# Patient Record
Sex: Male | Born: 1965 | Race: Black or African American | Hispanic: No | Marital: Single | Smoking: Never smoker
Health system: Southern US, Community
[De-identification: ages and names within clinical notes are randomized; demographics above are authoritative.]

## PROBLEM LIST (undated history)

## (undated) DIAGNOSIS — Z789 Other specified health status: Secondary | ICD-10-CM

## (undated) DIAGNOSIS — E119 Type 2 diabetes mellitus without complications: Secondary | ICD-10-CM

## (undated) DIAGNOSIS — E7800 Pure hypercholesterolemia, unspecified: Secondary | ICD-10-CM

## (undated) HISTORY — PX: ESOPHAGOGASTRODUODENOSCOPY (EGD) WITH PROPOFOL: SHX5813

---

## 2005-09-22 ENCOUNTER — Ambulatory Visit: Payer: Self-pay | Admitting: Internal Medicine

## 2009-10-22 ENCOUNTER — Ambulatory Visit: Payer: Self-pay | Admitting: Internal Medicine

## 2010-01-20 ENCOUNTER — Ambulatory Visit: Payer: Self-pay | Admitting: Gastroenterology

## 2010-06-04 IMAGING — US ABDOMEN ULTRASOUND
1 series · 17 of 25 positions shown · non-contrast
Comparison: none

REASON FOR EXAM: abd pain generalized
COMMENTS:

[Series 1: abdomen ultrasound · 17 of 74 slices shown]
[im 1/74]
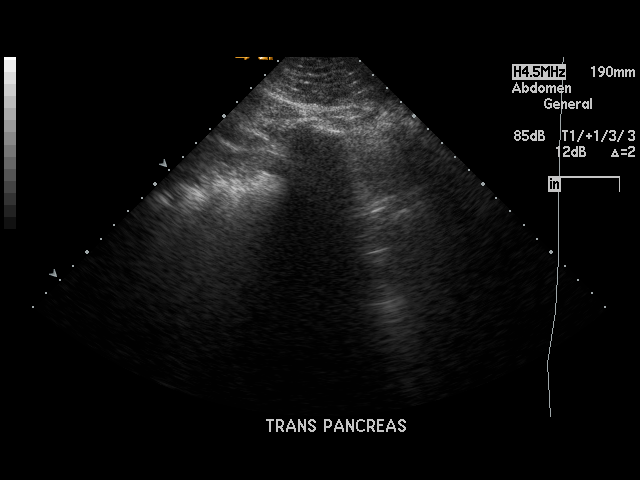
[im 7/74]
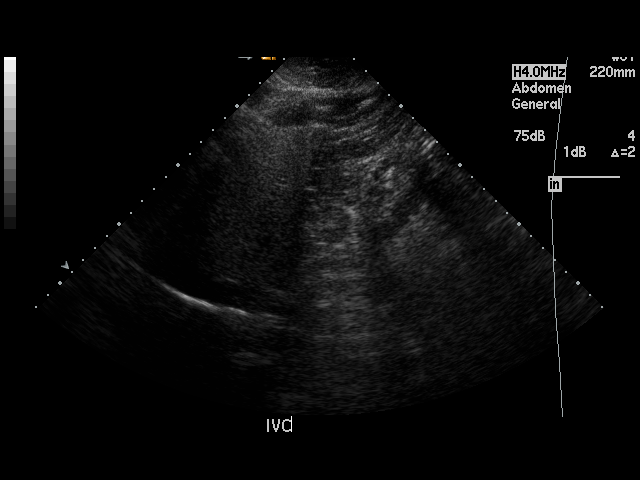
[im 10/74]
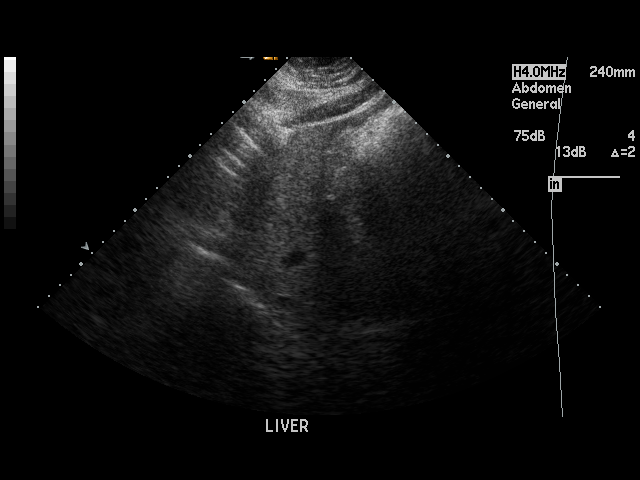
[im 16/74]
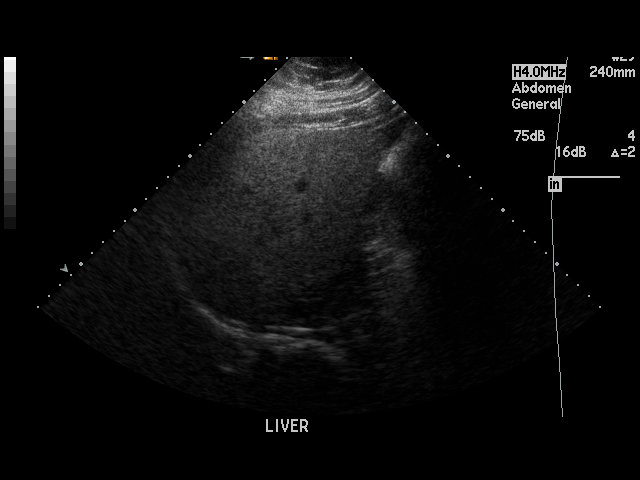
[im 19/74]
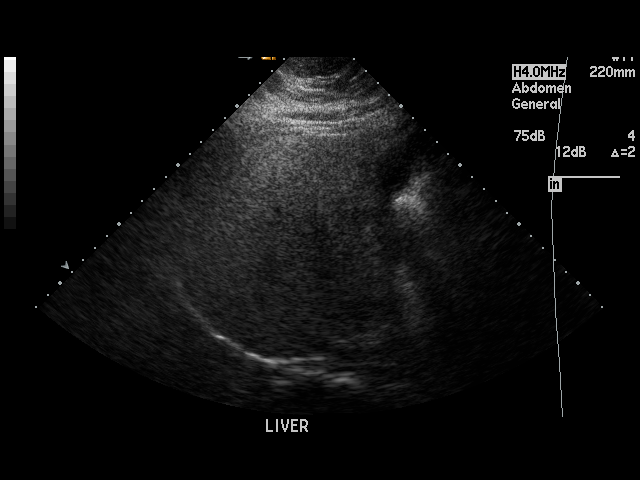
[im 25/74]
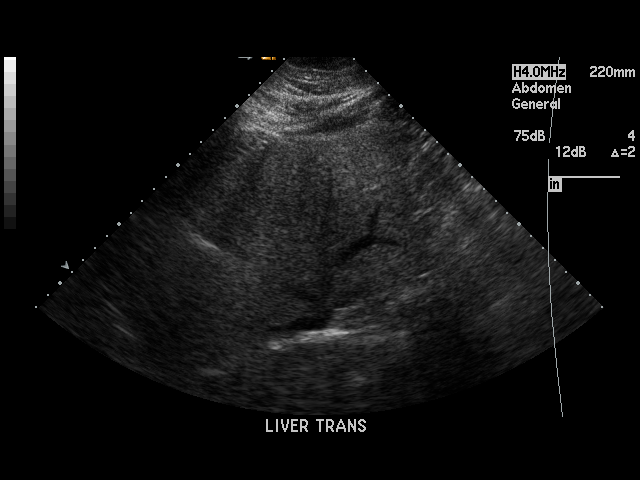
[im 28/74]
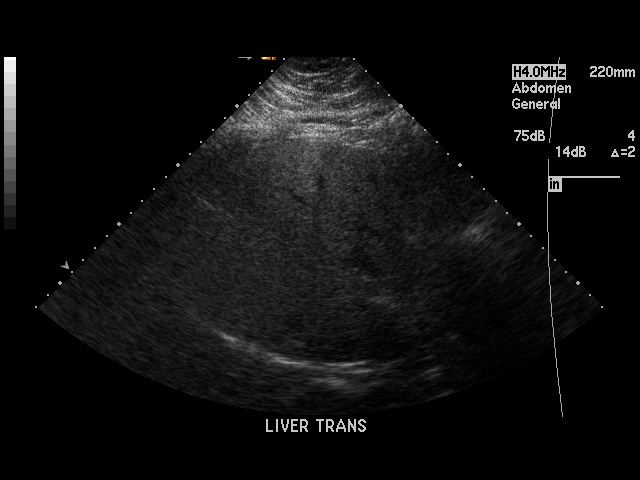
[im 34/74]
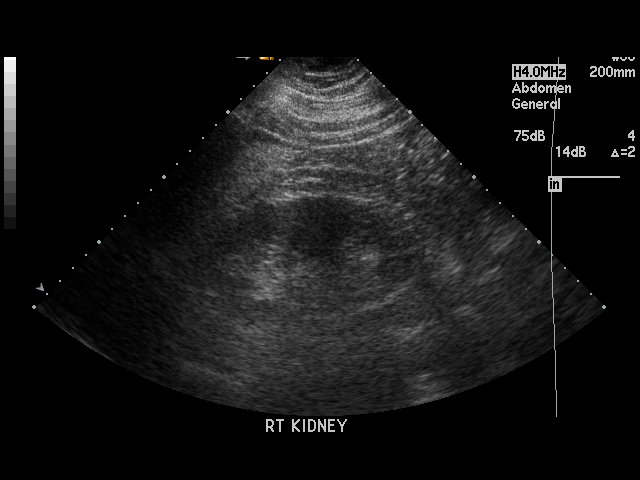
[im 37/74]
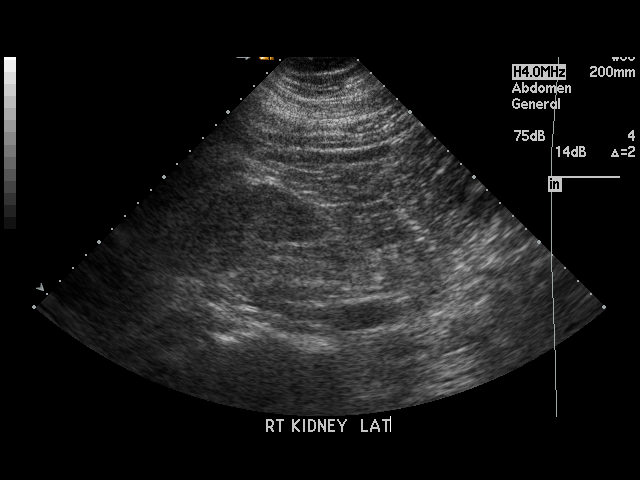
[im 40/74]
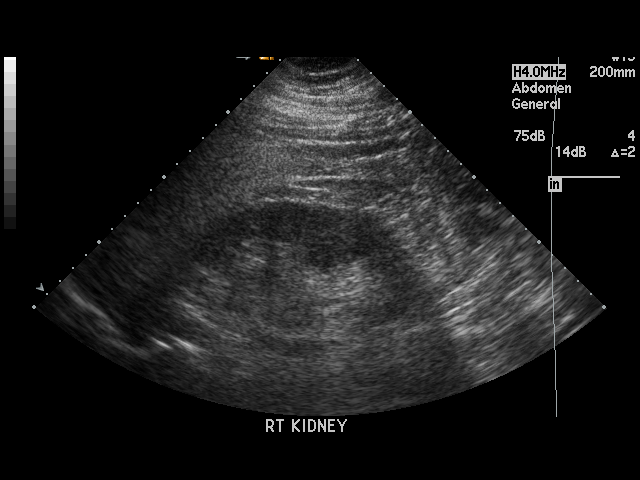
[im 46/74]
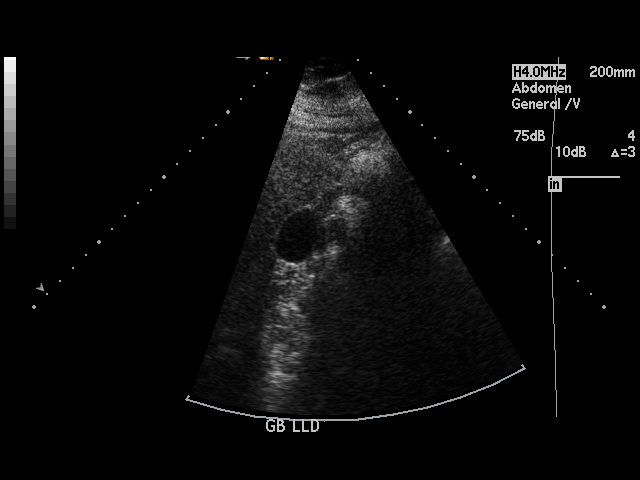
[im 49/74]
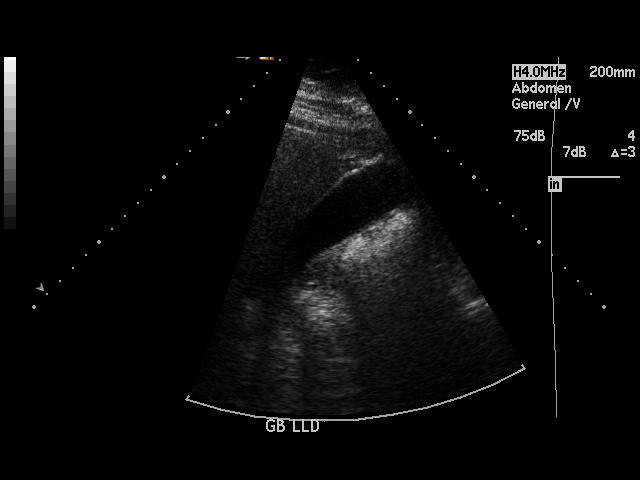
[im 55/74]
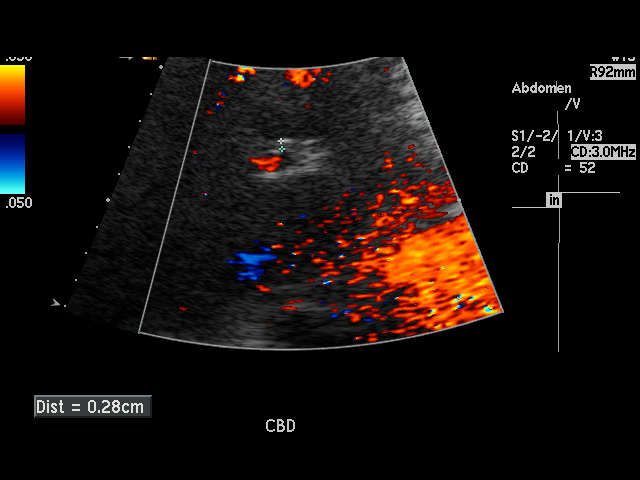
[im 58/74]
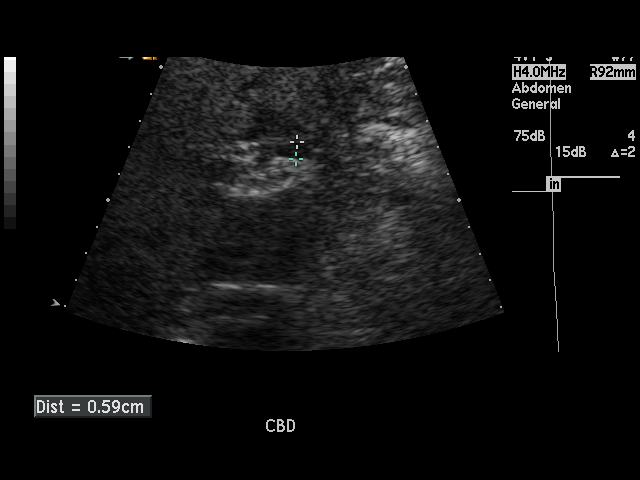
[im 64/74]
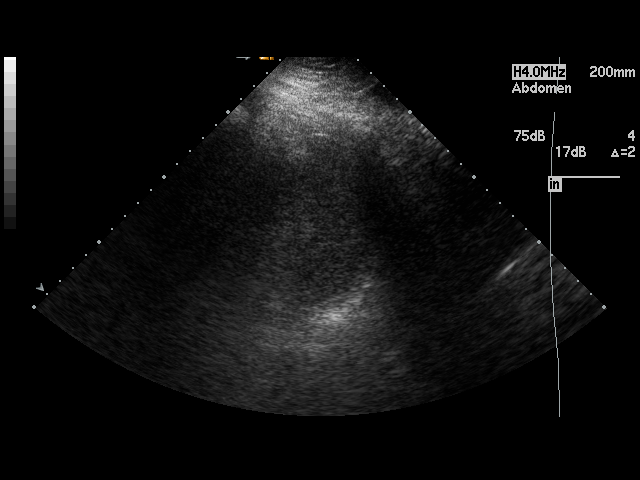
[im 67/74]
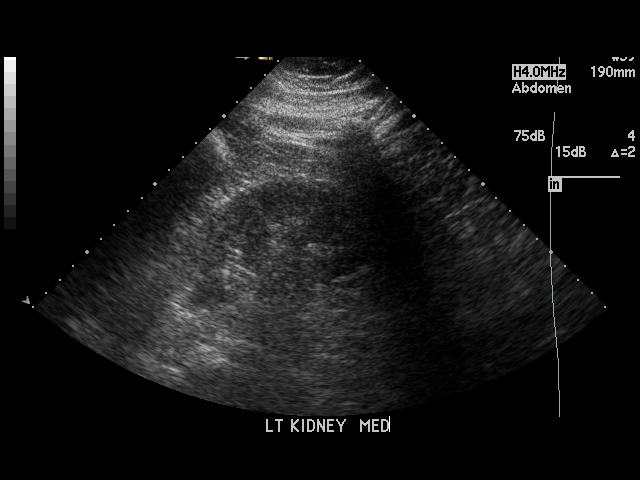
[im 74/74]
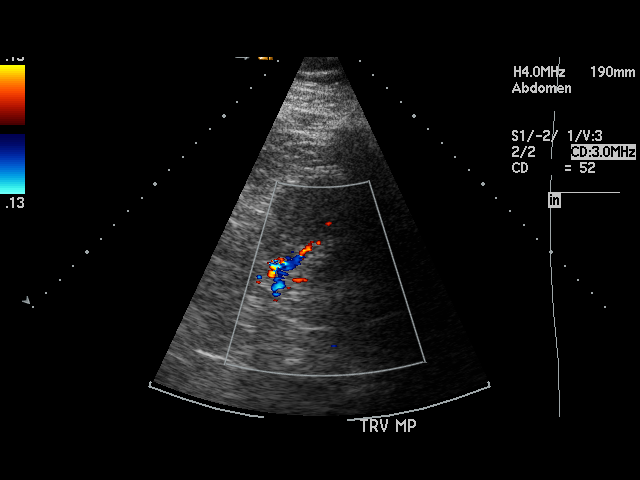

[17 of 25 positions shown; findings below may reference images not displayed]

PROCEDURE:     US  - US ABDOMEN GENERAL SURVEY  - October 22, 2009 [DATE]

RESULT:     The liver exhibits increased echotexture suggesting fatty
infiltration. Portal venous flow is normal in direction toward the liver. No
intrahepatic ductal dilation is seen. The gallbladder is adequately
distended with no evidence of stones, wall thickening, or pericholecystic
fluid. There is no sonographic Murphy's sign. The common bile duct is top
normal at 5.9 mm.

Evaluation of the pancreas and abdominal aorta was limited due to bowel gas.
No definite pancreatic lesion is seen. The abdominal aorta exhibits a
maximal measured diameter of approximately 2.6 cm in its proximal portion.
This tapers to 2.2 cm distally. The kidneys are normal in appearance.
IMPRESSION: 1. There are fatty infiltrative changes of the liver.
2. I do not see evidence of gallstones.
3. Evaluation the pancreas was limited due to bowel gas and the patient's
body habitus.
4. As best as can be determined the maximal measured diameter of the
abdominal aorta is seen proximaly and measures 2.6 cm.

## 2010-08-04 ENCOUNTER — Ambulatory Visit: Payer: Self-pay | Admitting: Gastroenterology

## 2015-08-25 ENCOUNTER — Telehealth: Payer: Self-pay | Admitting: Gastroenterology

## 2015-08-25 NOTE — Telephone Encounter (Signed)
Colonoscopy triage °

## 2017-07-11 ENCOUNTER — Encounter: Payer: Self-pay | Admitting: Emergency Medicine

## 2017-07-11 ENCOUNTER — Emergency Department: Payer: Medicare Other

## 2017-07-11 ENCOUNTER — Emergency Department
Admission: EM | Admit: 2017-07-11 | Discharge: 2017-07-11 | Disposition: A | Discharge status: Home / Self Care | Payer: Medicare Other | Attending: Emergency Medicine | Admitting: Emergency Medicine

## 2017-07-11 DIAGNOSIS — N289 Disorder of kidney and ureter, unspecified: Secondary | ICD-10-CM | POA: Insufficient documentation

## 2017-07-11 DIAGNOSIS — Z7984 Long term (current) use of oral hypoglycemic drugs: Secondary | ICD-10-CM | POA: Diagnosis not present

## 2017-07-11 DIAGNOSIS — R42 Dizziness and giddiness: Secondary | ICD-10-CM

## 2017-07-11 DIAGNOSIS — E119 Type 2 diabetes mellitus without complications: Secondary | ICD-10-CM | POA: Diagnosis not present

## 2017-07-11 DIAGNOSIS — Z79899 Other long term (current) drug therapy: Secondary | ICD-10-CM | POA: Insufficient documentation

## 2017-07-11 HISTORY — DX: Type 2 diabetes mellitus without complications: E11.9

## 2017-07-11 LAB — CBC WITH DIFFERENTIAL/PLATELET
BASOS ABS: 0 10*3/uL (ref 0–0.1)
Basophils Relative: 1 %
EOS ABS: 0.1 10*3/uL (ref 0–0.7)
EOS PCT: 2 %
HCT: 41.8 % (ref 40.0–52.0)
Hemoglobin: 13.9 g/dL (ref 13.0–18.0)
LYMPHS ABS: 1.5 10*3/uL (ref 1.0–3.6)
Lymphocytes Relative: 29 %
MCH: 27.5 pg (ref 26.0–34.0)
MCHC: 33.3 g/dL (ref 32.0–36.0)
MCV: 82.7 fL (ref 80.0–100.0)
MONO ABS: 0.6 10*3/uL (ref 0.2–1.0)
Monocytes Relative: 12 %
Neutro Abs: 2.9 10*3/uL (ref 1.4–6.5)
Neutrophils Relative %: 56 %
PLATELETS: 278 10*3/uL (ref 150–440)
RBC: 5.05 MIL/uL (ref 4.40–5.90)
RDW: 14.6 % — AB (ref 11.5–14.5)
WBC: 5.2 10*3/uL (ref 3.8–10.6)

## 2017-07-11 LAB — COMPREHENSIVE METABOLIC PANEL
ALT: 16 U/L — AB (ref 17–63)
AST: 24 U/L (ref 15–41)
Albumin: 3.8 g/dL (ref 3.5–5.0)
Alkaline Phosphatase: 73 U/L (ref 38–126)
Anion gap: 10 (ref 5–15)
BUN: 25 mg/dL — ABNORMAL HIGH (ref 6–20)
CHLORIDE: 107 mmol/L (ref 101–111)
CO2: 22 mmol/L (ref 22–32)
CREATININE: 2.52 mg/dL — AB (ref 0.61–1.24)
Calcium: 9.1 mg/dL (ref 8.9–10.3)
GFR, EST AFRICAN AMERICAN: 32 mL/min — AB (ref >60)
GFR, EST NON AFRICAN AMERICAN: 28 mL/min — AB (ref >60)
Glucose, Bld: 100 mg/dL — ABNORMAL HIGH (ref 65–99)
POTASSIUM: 4.3 mmol/L (ref 3.5–5.1)
SODIUM: 139 mmol/L (ref 135–145)
Total Bilirubin: 0.7 mg/dL (ref 0.3–1.2)
Total Protein: 7.1 g/dL (ref 6.5–8.1)

## 2017-07-11 LAB — TROPONIN I

## 2017-07-11 LAB — BASIC METABOLIC PANEL
ANION GAP: 10 (ref 5–15)
BUN: 25 mg/dL — ABNORMAL HIGH (ref 6–20)
CHLORIDE: 106 mmol/L (ref 101–111)
CO2: 23 mmol/L (ref 22–32)
Calcium: 8.8 mg/dL — ABNORMAL LOW (ref 8.9–10.3)
Creatinine, Ser: 2.38 mg/dL — ABNORMAL HIGH (ref 0.61–1.24)
GFR, EST AFRICAN AMERICAN: 34 mL/min — AB (ref >60)
GFR, EST NON AFRICAN AMERICAN: 30 mL/min — AB (ref >60)
Glucose, Bld: 133 mg/dL — ABNORMAL HIGH (ref 65–99)
POTASSIUM: 4.5 mmol/L (ref 3.5–5.1)
SODIUM: 139 mmol/L (ref 135–145)

## 2017-07-11 MED ORDER — SODIUM CHLORIDE 0.9 % IV SOLN
Freq: Once | INTRAVENOUS | Status: AC
  Administered 2017-07-11: 1000 mL via INTRAVENOUS

## 2017-07-11 NOTE — ED Provider Notes (Signed)
[facility] Emergency Department Provider Note       Time seen: ----------------------------------------- 12:26 PM on 07/11/2017 -----------------------------------------     I have reviewed the triage vital signs and the nursing notes.   HISTORY   Chief Complaint Fall     HPI Edwin [name] is a 51 y.o. male who presents to the ED for a fall that occurred due to dizziness. Patient arrives from his workplace where he reports getting dizzy and falling injuring his right knee. EMS reports low blood pressure that change from lying to sitting by about 40 points. He lives in a group home at this point denies complaints. Patient thinks he may have had a seizure because he is feeling dizzy.   Past Medical History:  Diagnosis Date  . Diabetes mellitus without complication (HCC)     There are no active problems to display for this patient.   No past surgical history on file.  Allergies Patient has no known allergies.  Social History Social History  Substance Use Topics  . Smoking status: Never Smoker  . Smokeless tobacco: Never Used  . Alcohol use No    Review of Systems Constitutional: Negative for fever. Cardiovascular: Negative for chest pain. Respiratory: Negative for shortness of breath. Gastrointestinal: Negative for abdominal pain, vomiting and diarrhea. Musculoskeletal: Negative for back pain. Skin:Positive for left knee abrasion Neurological: Negative for headaches, focal weakness or numbness. Positive for dizziness  All systems negative/normal/unremarkable except as stated in the HPI  ____________________________________________   PHYSICAL EXAM:  VITAL SIGNS: ED Triage Vitals [07/11/17 1214]  Enc Vitals Group     BP      Pulse      Resp      Temp      Temp src      SpO2      Weight (!) 302 lb 0.5 oz (137 kg)     Height 6\' 2"  (1.88 m)     Head Circumference      Peak Flow      Pain Score      Pain Loc      Pain Edu?       Excl. in GC?    Constitutional: Well appearing and in no distress. Eyes: Conjunctivae are normal. Normal extraocular movements. ENT   Head: Normocephalic and atraumatic.   Nose: No congestion/rhinnorhea.   Mouth/Throat: Mucous membranes are moist.   Neck: No stridor. Cardiovascular: Normal rate, regular rhythm. No murmurs, rubs, or gallops. Respiratory: Normal respiratory effort without tachypnea nor retractions. Breath sounds are clear and equal bilaterally. No wheezes/rales/rhonchi. Gastrointestinal: Soft and nontender. Normal bowel sounds Musculoskeletal: Nontender with normal range of motion in extremities. No lower extremity tenderness nor edema. Neurologic:  Normal speech and language. No gross focal neurologic deficits are appreciated.  Skin:  Abrasion is noted over the right patella Psychiatric: Mood and affect are normal. Speech and behavior are normal.  ____________________________________________  EKG: Interpreted by me. Sinus rhythm with rate 87 bpm, normal PR interval, normal QT. Low voltage  ____________________________________________  ED COURSE:  Pertinent labs & imaging results that were available during my care of the patient were reviewed by me and considered in my medical decision making (see chart for details). Patient presents for dizziness and a fall, we will assess with labs and imaging as indicated.   Procedures ____________________________________________   LABS (pertinent positives/negatives)  Labs Reviewed  CBC WITH DIFFERENTIAL/PLATELET - Abnormal; Notable for the following:       Result Value  RDW 14.6 (*)    All other components within normal limits  COMPREHENSIVE METABOLIC PANEL - Abnormal; Notable for the following:    Glucose, Bld 100 (*)    BUN 25 (*)    Creatinine, Ser 2.52 (*)    ALT 16 (*)    GFR calc non Af Amer 28 (*)    GFR calc Af Amer 32 (*)    All other components within normal limits  BASIC METABOLIC PANEL -  Abnormal; Notable for the following:    Glucose, Bld 133 (*)    BUN 25 (*)    Creatinine, Ser 2.38 (*)    Calcium 8.8 (*)    GFR calc non Af Amer 30 (*)    GFR calc Af Amer 34 (*)    All other components within normal limits  TROPONIN I    RADIOLOGY Images were viewed by me  IMPRESSION: Negative chest.  ____________________________________________  FINAL ASSESSMENT AND PLAN  Dizziness, Renal insufficiency  Plan: Patient's labs and imaging were dictated above. Patient had presented for dizziness which is likely multifactorial. Patient appears somewhat dehydrated and has been taking cough and cold medication for upper respiratory infection or bronchitis. His chest x-ray is negative and a low velocity stop this medication as it is probably dehydrated him and cause some dizziness. It is unclear of the renal insufficiency is new or old, he will need close outpatient follow-up but appear to be improving after fluids. I did offer admission but the family would prefer to go follow up as an outpatient.   Emily FilbertWilliams, Jonathan E, MD   Note: This note was generated in part or whole with voice recognition software. Voice recognition is usually quite accurate but there are transcription errors that can and very often do occur. I apologize for any typographical errors that were not detected and corrected.     Emily FilbertWilliams, Jonathan E, MD 07/11/17 [phone]

## 2017-07-11 NOTE — ED Notes (Signed)
Discussed discharge instructions, prescriptions, and follow-up care with patient and pt's sister. Unable to contact patient's legal guardian due to full voicemail. Sister to take patient back to established group home. No questions or concerns at this time. Pt stable at discharge.

## 2017-07-11 NOTE — ED Notes (Addendum)
Unable to leave message for patient's legal guardian/mother to return call to ED due to full voicemail.

## 2017-07-11 NOTE — ED Triage Notes (Signed)
Pt to ed via [facility] from [employer] with reports of getting dizzy and falling inuring his right knee.  Ems reports low blood pressure in supine postion at 82/54, sitting was 120/54, cbg 110, 20 gauge in RAC. 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

## 2018-02-21 IMAGING — CR DG CHEST 2V
2 series · 2 of 2 positions shown · non-contrast
Comparison: 09/22/2005

CLINICAL DATA: Cough

EXAM:
CHEST  2 VIEW

[chest lat]
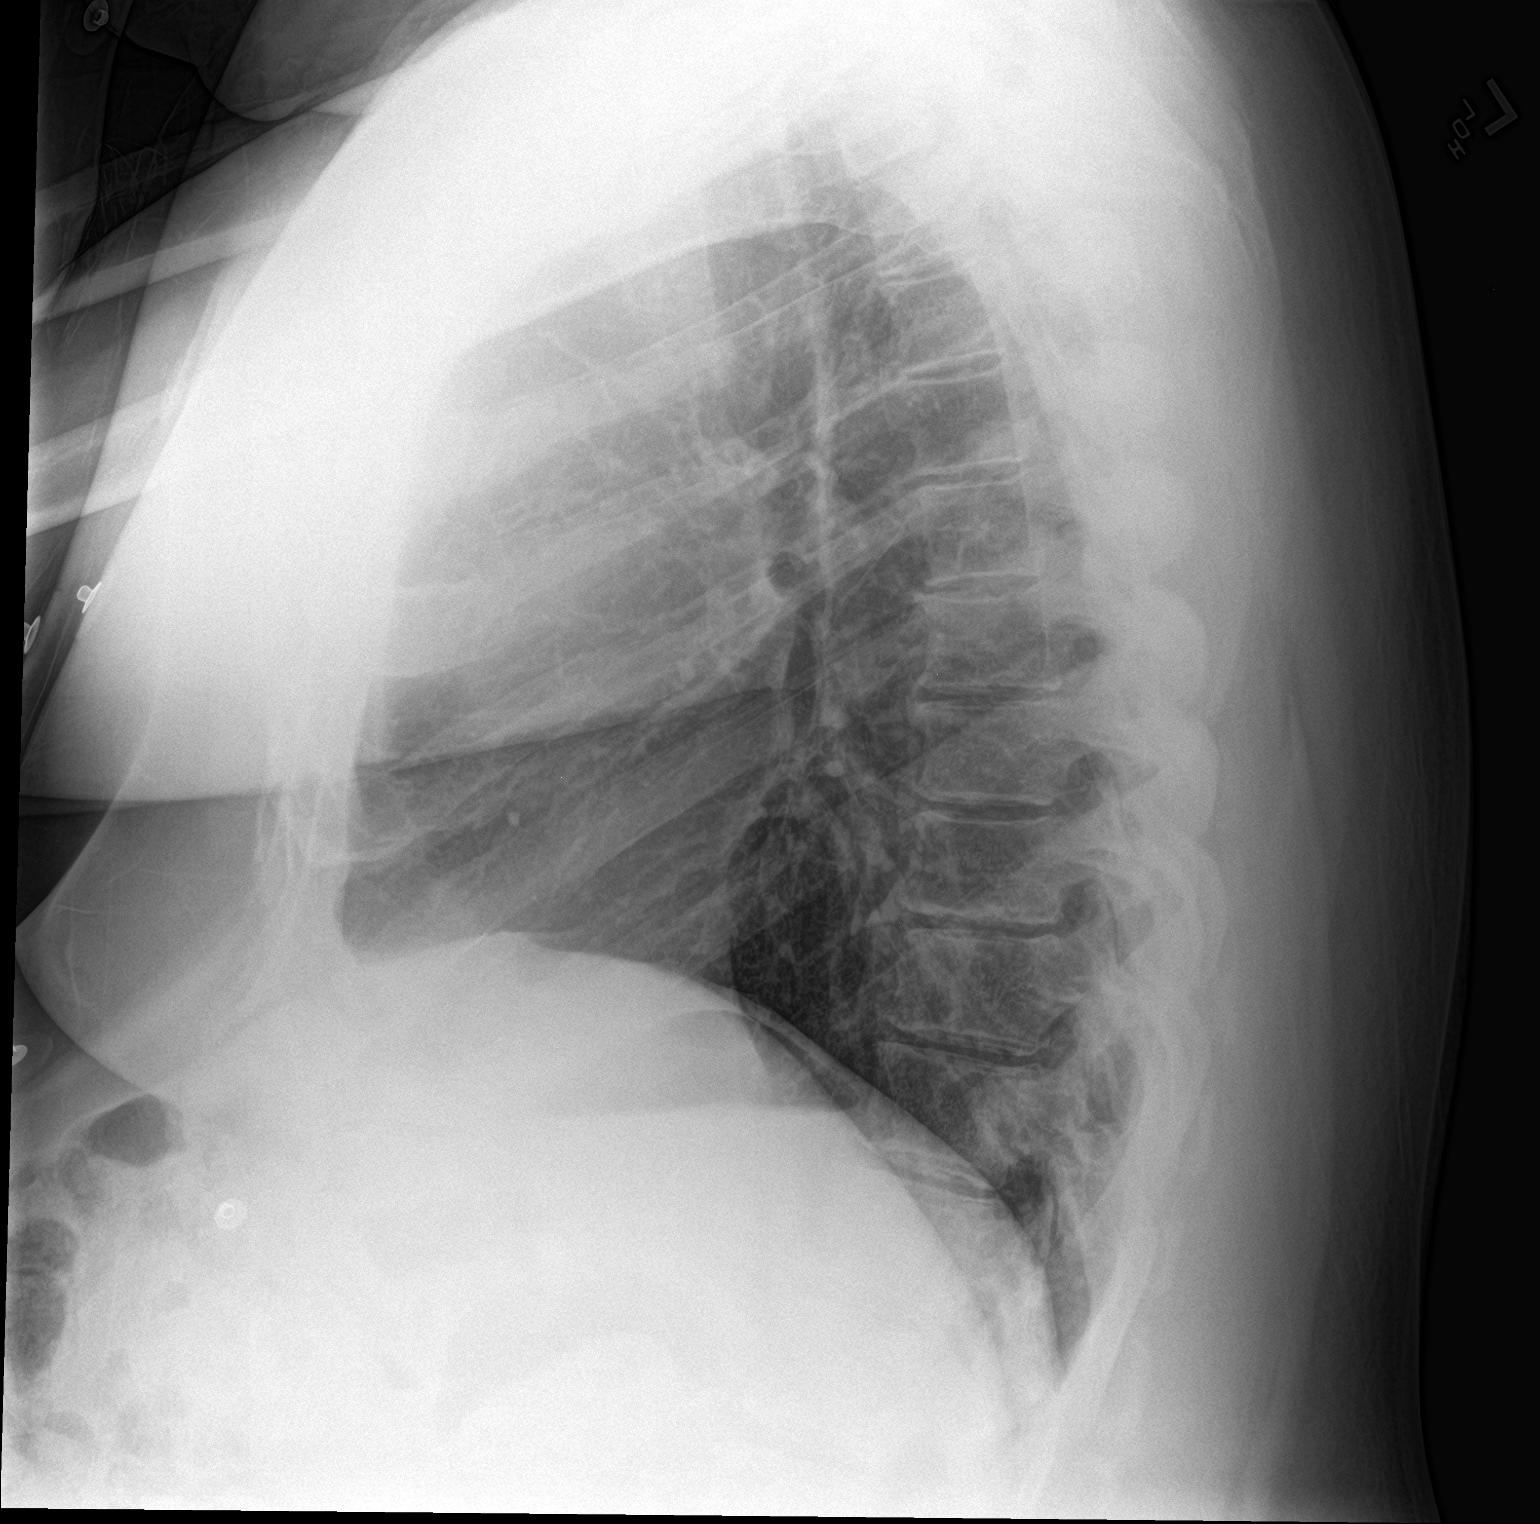

[chest ap]
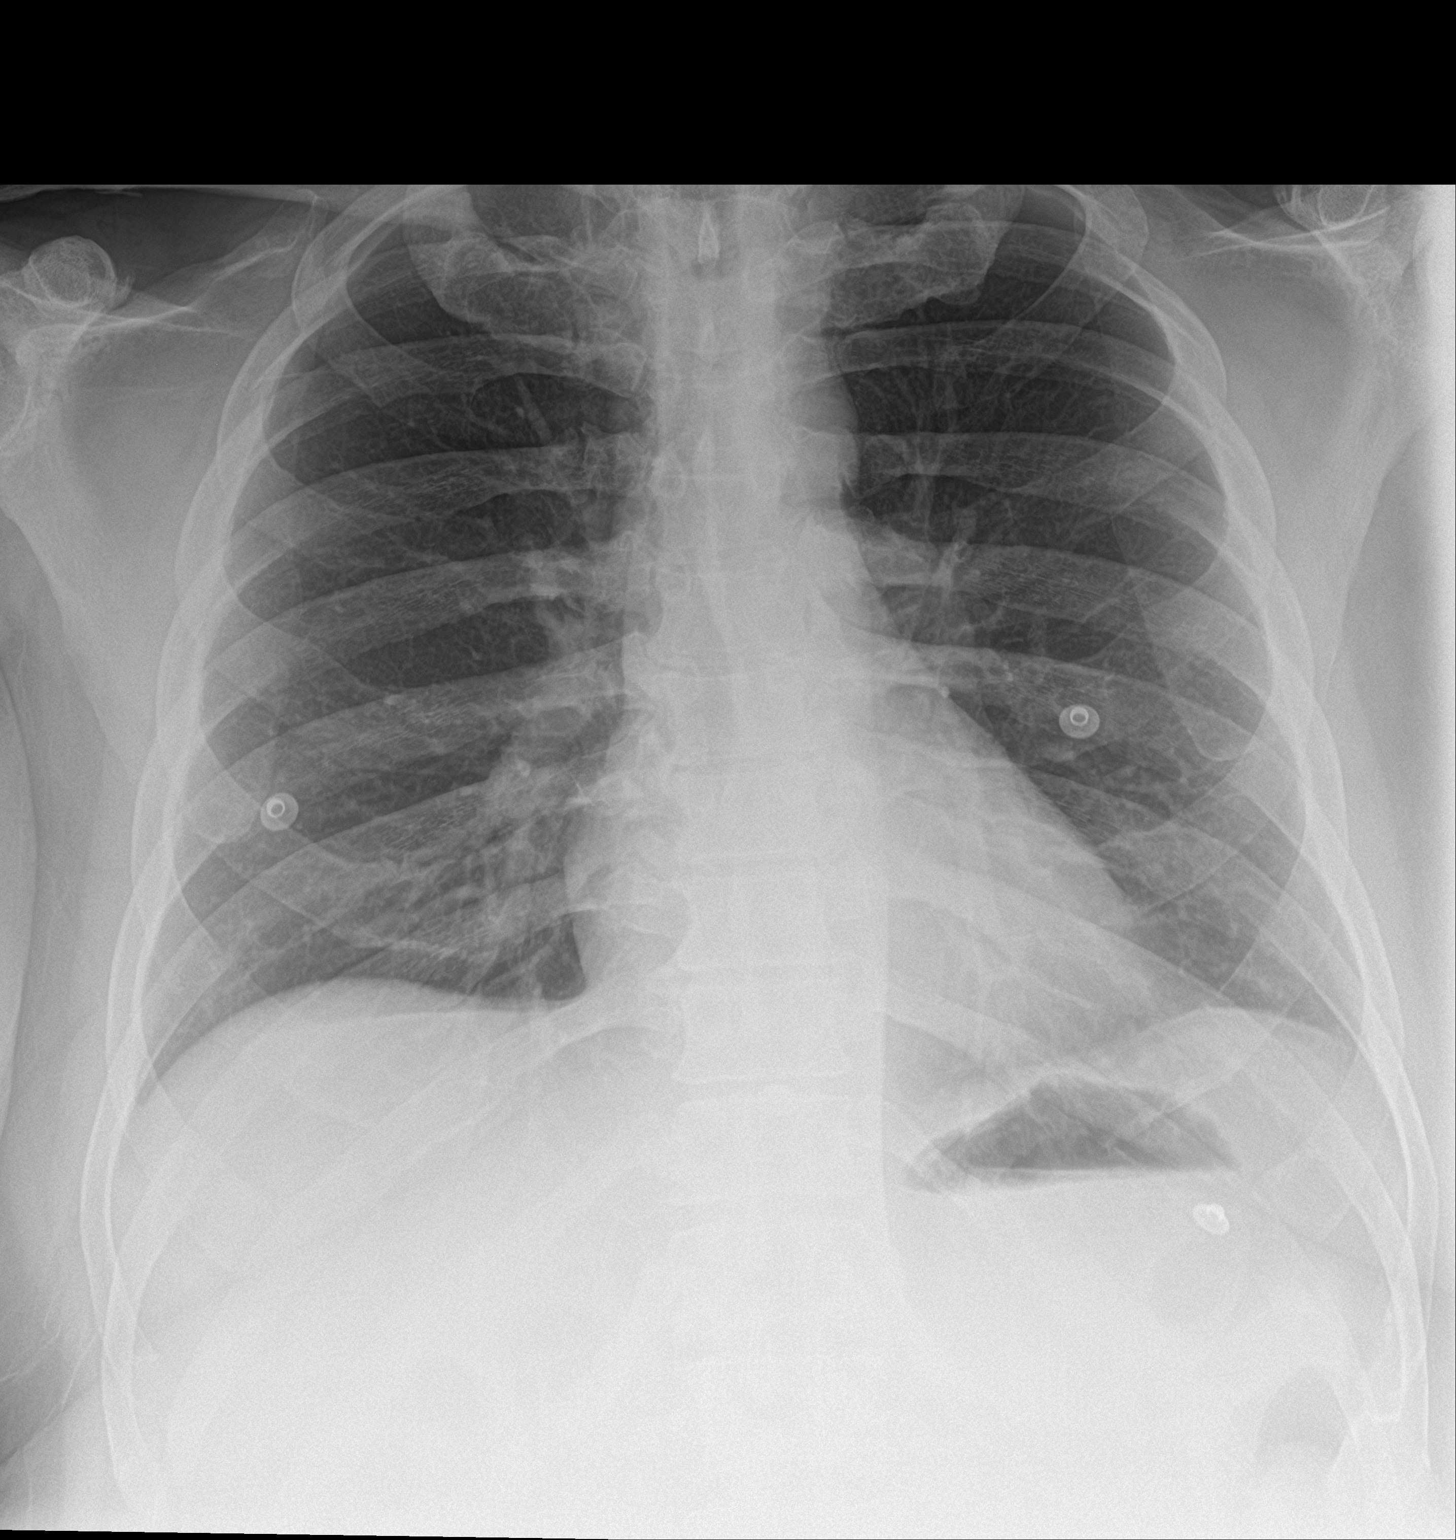

[2 of 2 positions shown; findings below may reference images not displayed]

FINDINGS: Normal heart size and mediastinal contours. No acute infiltrate or
edema. No effusion or pneumothorax. No acute osseous findings.
IMPRESSION: Negative chest.

## 2020-04-10 ENCOUNTER — Other Ambulatory Visit: Payer: Self-pay | Admitting: Internal Medicine

## 2020-05-05 ENCOUNTER — Other Ambulatory Visit: Payer: Self-pay | Admitting: Internal Medicine

## 2020-05-13 ENCOUNTER — Other Ambulatory Visit: Payer: Self-pay

## 2020-05-13 ENCOUNTER — Encounter: Payer: Self-pay | Admitting: Internal Medicine

## 2020-05-13 ENCOUNTER — Ambulatory Visit (INDEPENDENT_AMBULATORY_CARE_PROVIDER_SITE_OTHER): Payer: Medicare Other | Admitting: Internal Medicine

## 2020-05-13 VITALS — BP 124/78 | HR 101 | Wt 325.5 lb

## 2020-05-13 DIAGNOSIS — E139 Other specified diabetes mellitus without complications: Secondary | ICD-10-CM | POA: Insufficient documentation

## 2020-05-13 DIAGNOSIS — G4730 Sleep apnea, unspecified: Secondary | ICD-10-CM

## 2020-05-13 DIAGNOSIS — E669 Obesity, unspecified: Secondary | ICD-10-CM | POA: Insufficient documentation

## 2020-05-13 DIAGNOSIS — F22 Delusional disorders: Secondary | ICD-10-CM | POA: Diagnosis not present

## 2020-05-13 DIAGNOSIS — Z1329 Encounter for screening for other suspected endocrine disorder: Secondary | ICD-10-CM

## 2020-05-13 NOTE — Addendum Note (Signed)
Addended by: Melody Comas L on: 05/13/2020 09:30 AM   Modules accepted: Orders

## 2020-05-13 NOTE — Assessment & Plan Note (Signed)
Lose wt 

## 2020-05-13 NOTE — Assessment & Plan Note (Signed)
Blood sugar 88  Will check Hbaic

## 2020-05-13 NOTE — Assessment & Plan Note (Signed)
Uses cpap, advised to lose wt

## 2020-05-13 NOTE — Progress Notes (Signed)
Patient ID: Edwin [name], male   DOB: Nov 15, 1966, 54 y.o.   MRN: [id number]    Established Patient Office Visit  Subjective:  Patient ID: Edwin [name], male    DOB: 09-02-1966  Age: 54 y.o. MRN: [id number]  CC:  Chief Complaint  Patient presents with   Diabetes    patient's blood sugar was 88 this morning     HPI  [name] presents for a follow up regarding his diabetes. This morning, his blood sugar was 88. He notes that he "is feeling good." He says he hears voices and that "his mind is messing him up.". He takes his medications as prescribed. Denies heartburn, chest pain, and leg swelling. He reports that he does not walk much. He snores and uses a CPAP machine at night. He currently lives in a group home.  Past Medical History:  Diagnosis Date   Diabetes mellitus without complication (HCC)     History reviewed. No pertinent surgical history.  History reviewed. No pertinent family history.  Social History   Socioeconomic History   Marital status: Divorced    Spouse name: Not on file   Number of children: Not on file   Years of education: Not on file   Highest education level: Not on file  Occupational History   Not on file  Tobacco Use   Smoking status: Never Smoker   Smokeless tobacco: Never Used  Substance and Sexual Activity   Alcohol use: No   Drug use: No   Sexual activity: Not on file  Other Topics Concern   Not on file  Social History Narrative   Not on file   Social Determinants of Health   Financial Resource Strain:    Difficulty of Paying Living Expenses:   Food Insecurity:    Worried About Programme researcher, broadcasting/film/video in the Last Year:    Barista in the Last Year:   Transportation Needs:    Freight forwarder (Medical):    Lack of Transportation (Non-Medical):   Physical Activity:    Days of Exercise per Week:    Minutes of Exercise per Session:   Stress:    Feeling of Stress :   Social Connections:     Frequency of Communication with Friends and Family:    Frequency of Social Gatherings with Friends and Family:    Attends Religious Services:    Active Member of Clubs or Organizations:    Attends Engineer, structural:    Marital Status:   Intimate Partner Violence:    Fear of Current or Ex-Partner:    Emotionally Abused:    Physically Abused:    Sexually Abused:      Current Outpatient Medications:    Amantadine HCl 100 MG tablet, Take 100 mg by mouth 2 (two) times daily., Disp: , Rfl:    Cholecalciferol (VITAMIN D3) 1.25 MG (50000 UT) CAPS, Take 1.25 mg by mouth daily., Disp: , Rfl:    clonazePAM (KLONOPIN) 0.5 MG tablet, Take 0.5 mg by mouth at bedtime., Disp: , Rfl:    escitalopram (LEXAPRO) 10 MG tablet, Take 10 mg by mouth daily., Disp: , Rfl:    JANUVIA 100 MG tablet, TAKE 1 TABLET BY MOUTH ONCE DAILY., Disp: 30 tablet, Rfl: 0   metFORMIN (GLUCOPHAGE) 1000 MG tablet, TAKE 1 TABLET BY MOUTH TWICE DAILY, Disp: 60 tablet, Rfl: 0   omeprazole (PRILOSEC) 20 MG capsule, Take 20 mg by mouth daily., Disp: , Rfl:  risperiDONE (RISPERDAL) 3 MG tablet, Take 3 mg by mouth daily., Disp: , Rfl:    simethicone (MYLICON) 673 MG chewable tablet, Chew 125 mg by mouth 3 (three) times daily., Disp: , Rfl:    simvastatin (ZOCOR) 20 MG tablet, TAKE 1 TABLET BY MOUTH ONCE DAILY., Disp: 30 tablet, Rfl: 0   thioridazine (MELLARIL) 100 MG tablet, Take 100 mg by mouth 2 (two) times daily., Disp: , Rfl:    No Known Allergies  ROS Review of Systems  Constitutional: Negative.   HENT: Negative.   Eyes: Negative.   Respiratory: Negative.   Cardiovascular: Negative.  Negative for chest pain and leg swelling.  Gastrointestinal: Negative.   Endocrine: Negative.   Genitourinary: Negative.   Musculoskeletal: Negative.   Allergic/Immunologic: Negative.   Neurological: Negative.   Hematological: Negative.   Psychiatric/Behavioral:       Reports that he hears voices.        Objective:    Physical Exam  Constitutional: The patient is oriented to person, place, and time. Pt appears well-developed and well-nourished. Pt is obese. Head: Normocephalic and atraumatic.  Eyes: Pupils are equal, round, and reactive to light.  Neck: No JVD present. No tracheal deviation present. No thyromegaly present.  Cardiovascular: Regular rate and rhythm. No gallop. Pulmonary/Chest: Normal breath sounds. Lungs clear to auscultation. Abdominal: No abdominal tenderness. No guarding or rebound tenderness.. Musculoskeletal: Normal range of motion.  Lymphatic: No cervical adenopathy.  Neurological: No cranial nerve deficit.  Skin: Skin is warm and hydrated.  Psychiatric: The patient is experiencing delusions and possible hallucinations.  BP 124/78    Pulse (!) 101    Wt (!) 325 lb 8 oz (147.6 kg)    BMI 41.79 kg/m  Wt Readings from Last 3 Encounters:  05/13/20 (!) 325 lb 8 oz (147.6 kg)  07/11/17 (!) 302 lb 0.5 oz (137 kg)     Health Maintenance Due  Topic Date Due   HEMOGLOBIN A1C  Never done   Hepatitis C Screening  Never done   PNEUMOCOCCAL POLYSACCHARIDE VACCINE AGE 77-64 HIGH RISK  Never done   FOOT EXAM  Never done   OPHTHALMOLOGY EXAM  Never done   URINE MICROALBUMIN  Never done   COVID-19 Vaccine (1) Never done   HIV Screening  Never done   TETANUS/TDAP  Never done   COLONOSCOPY  Never done    There are no preventive care reminders to display for this patient.  No results found for: TSH Lab Results  Component Value Date   WBC 5.2 07/11/2017   HGB 13.9 07/11/2017   HCT 41.8 07/11/2017   MCV 82.7 07/11/2017   PLT 278 07/11/2017   Lab Results  Component Value Date   NA 139 07/11/2017   K 4.5 07/11/2017   CO2 23 07/11/2017   GLUCOSE 133 (H) 07/11/2017   BUN 25 (H) 07/11/2017   CREATININE 2.38 (H) 07/11/2017   BILITOT 0.7 07/11/2017   ALKPHOS 73 07/11/2017   AST 24 07/11/2017   ALT 16 (L) 07/11/2017   PROT 7.1 07/11/2017   ALBUMIN 3.8  07/11/2017   CALCIUM 8.8 (L) 07/11/2017   ANIONGAP 10 07/11/2017   No results found for: CHOL No results found for: HDL No results found for: LDLCALC No results found for: TRIG No results found for: CHOLHDL No results found for: HGBA1C    Assessment & Plan:   Problem List Items Addressed This Visit      Respiratory   Sleep apnea in adult  Uses cpap, advised to lose wt        Endocrine   Diabetes 1.5, 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  1. Diabetes 1.5, managed as type 2 (HCC) Blood sugar 88 today we will check hemoglobin AIC also check kidney function today.  2. Obesity (BMI 35.0-39.9 without comorbidity) -Patient was advised to lose weight  3. Delusion [facility]) Refer to psychiatrist for that  4. Sleep apnea in adult Patient uses CPAP machine Follow-up: Return in about 2 months (around 07/13/2020).   By signing my name below, I, Pietro Cassis, attest that this documentation has been prepared under the direction and in the presence of [name], MD. Electronically Signed: Pietro Cassis, Medical Scribe. 05/13/20. 9:12 AM.  I personally performed the services described in this documentation, which was SCRIBED in my presence. The recorded information has been reviewed and considered accurate. It has been edited as necessary during review. [name], MD

## 2020-05-14 LAB — CBC WITH DIFFERENTIAL/PLATELET
Absolute Monocytes: 538 cells/uL (ref 200–950)
Basophils Absolute: 29 cells/uL (ref 0–200)
Basophils Relative: 0.6 %
Eosinophils Absolute: 58 cells/uL (ref 15–500)
Eosinophils Relative: 1.2 %
HCT: 44.1 % (ref 38.5–50.0)
Hemoglobin: 14.3 g/dL (ref 13.2–17.1)
Lymphs Abs: 1512 cells/uL (ref 850–3900)
MCH: 27.2 pg (ref 27.0–33.0)
MCHC: 32.4 g/dL (ref 32.0–36.0)
MCV: 84 fL (ref 80.0–100.0)
MPV: 10.2 fL (ref 7.5–12.5)
Monocytes Relative: 11.2 %
Neutro Abs: 2664 cells/uL (ref 1500–7800)
Neutrophils Relative %: 55.5 %
Platelets: 189 10*3/uL (ref 140–400)
RBC: 5.25 10*6/uL (ref 4.20–5.80)
RDW: 13.1 % (ref 11.0–15.0)
Total Lymphocyte: 31.5 %
WBC: 4.8 10*3/uL (ref 3.8–10.8)

## 2020-05-14 LAB — COMPLETE METABOLIC PANEL WITH GFR
AG Ratio: 1.6 (calc) (ref 1.0–2.5)
ALT: 13 U/L (ref 9–46)
AST: 13 U/L (ref 10–35)
Albumin: 4.2 g/dL (ref 3.6–5.1)
Alkaline phosphatase (APISO): 88 U/L (ref 35–144)
BUN/Creatinine Ratio: 9 (calc) (ref 6–22)
BUN: 16 mg/dL (ref 7–25)
CO2: 22 mmol/L (ref 20–32)
Calcium: 9.5 mg/dL (ref 8.6–10.3)
Chloride: 105 mmol/L (ref 98–110)
Creat: 1.86 mg/dL — ABNORMAL HIGH (ref 0.70–1.33)
GFR, Est African American: 46 mL/min/{1.73_m2} — ABNORMAL LOW (ref >=60)
GFR, Est Non African American: 40 mL/min/{1.73_m2} — ABNORMAL LOW (ref >=60)
Globulin: 2.6 g/dL (calc) (ref 1.9–3.7)
Glucose, Bld: 75 mg/dL (ref 65–99)
Potassium: 4.4 mmol/L (ref 3.5–5.3)
Sodium: 140 mmol/L (ref 135–146)
Total Bilirubin: 0.5 mg/dL (ref 0.2–1.2)
Total Protein: 6.8 g/dL (ref 6.1–8.1)

## 2020-05-14 LAB — TSH: TSH: 1.4 mIU/L (ref 0.40–4.50)

## 2020-05-14 LAB — HEMOGLOBIN A1C
Hgb A1c MFr Bld: 5.4 % of total Hgb (ref <5.7)
Mean Plasma Glucose: 108 (calc)
eAG (mmol/L): 6 (calc)

## 2020-06-02 ENCOUNTER — Other Ambulatory Visit: Payer: Self-pay | Admitting: Internal Medicine

## 2020-06-04 ENCOUNTER — Other Ambulatory Visit: Payer: Self-pay | Admitting: Internal Medicine

## 2020-07-02 ENCOUNTER — Other Ambulatory Visit: Payer: Self-pay | Admitting: Internal Medicine

## 2020-07-17 ENCOUNTER — Ambulatory Visit: Payer: Medicare Other | Admitting: Internal Medicine

## 2020-07-24 ENCOUNTER — Ambulatory Visit: Payer: Medicare Other | Admitting: Internal Medicine

## 2020-07-29 ENCOUNTER — Other Ambulatory Visit: Payer: Self-pay | Admitting: Internal Medicine

## 2020-08-03 ENCOUNTER — Other Ambulatory Visit: Payer: Self-pay | Admitting: *Deleted

## 2020-08-03 MED ORDER — SIMVASTATIN 20 MG PO TABS: 20.0000 mg | ORAL_TABLET | Freq: Every day | ORAL | 6 refills | Status: DC

## 2020-08-03 MED ORDER — OMEPRAZOLE 20 MG PO CPDR: 20.0000 mg | DELAYED_RELEASE_CAPSULE | Freq: Every day | ORAL | 6 refills | Status: DC

## 2020-08-03 MED ORDER — SITAGLIPTIN PHOSPHATE 100 MG PO TABS: 100.0000 mg | ORAL_TABLET | Freq: Every day | ORAL | 6 refills | Status: DC

## 2020-08-21 ENCOUNTER — Encounter: Payer: Self-pay | Admitting: Internal Medicine

## 2020-08-21 ENCOUNTER — Other Ambulatory Visit: Payer: Self-pay

## 2020-08-21 ENCOUNTER — Ambulatory Visit (INDEPENDENT_AMBULATORY_CARE_PROVIDER_SITE_OTHER): Payer: Medicare Other | Admitting: Internal Medicine

## 2020-08-21 VITALS — BP 151/95 | HR 101 | Ht 74.0 in | Wt 315.5 lb

## 2020-08-21 DIAGNOSIS — G4730 Sleep apnea, unspecified: Secondary | ICD-10-CM

## 2020-08-21 DIAGNOSIS — E669 Obesity, unspecified: Secondary | ICD-10-CM | POA: Diagnosis not present

## 2020-08-21 DIAGNOSIS — E782 Mixed hyperlipidemia: Secondary | ICD-10-CM | POA: Diagnosis not present

## 2020-08-21 DIAGNOSIS — E139 Other specified diabetes mellitus without complications: Secondary | ICD-10-CM | POA: Diagnosis not present

## 2020-08-21 LAB — GLUCOSE, POCT (MANUAL RESULT ENTRY): POC Glucose: 73 mg/dl (ref 70–99)

## 2020-08-21 NOTE — Assessment & Plan Note (Signed)
Pt uses  Sleep machine daily

## 2020-08-21 NOTE — Assessment & Plan Note (Signed)
-   The patient's blood sugar is under control on metformin. - The patient will continue the current treatment regimen.  - I encouraged the patient to regularly check blood sugar.  - I encouraged the patient to monitor diet. I encouraged the patient to eat low-carb and low-sugar to help prevent blood sugar spikes.  - I encouraged the patient to continue following their prescribed treatment plan for diabetes - I informed the patient to get help if blood sugar drops below 54mg/dL, or if suddenly have trouble thinking clearly or breathing.     

## 2020-08-21 NOTE — Assessment & Plan Note (Signed)
-   I encouraged the patient to lose weight.  - I educated them on making healthy dietary choices including eating more fruits and vegetables and less fried foods. - I encouraged the patient to exercise more, and educated on the benefits of exercise including weight loss, diabetes prevention, and hypertension prevention.   

## 2020-08-21 NOTE — Progress Notes (Signed)
Established Patient Office Visit  Subjective:  Patient ID: Edwin [name], male    DOB: [birth year]  Age: 54 y.o. MRN: [id number]  CC:  Chief Complaint  Patient presents with  . Diabetes    2 month follow up    Diabetes He has type 2 diabetes mellitus. His disease course has been fluctuating. Hypoglycemia symptoms include confusion. Pertinent negatives for hypoglycemia include no headaches or nervousness/anxiousness. Associated symptoms include fatigue and weight loss. Pertinent negatives for diabetes include no blurred vision and no chest pain. Pertinent negatives for diabetic complications include no autonomic neuropathy. Risk factors for coronary artery disease include dyslipidemia, sedentary lifestyle and obesity. He never participates in exercise.    Edwin [name] presents for  Tiredness   sleeiness  Past Medical History:  Diagnosis Date  . Diabetes mellitus without complication (HCC)     History reviewed. No pertinent surgical history.  History reviewed. No pertinent family history.  Social History   Socioeconomic History  . Marital status: Divorced    Spouse name: Not on file  . Number of children: Not on file  . Years of education: Not on file  . Highest education level: Not on file  Occupational History  . Not on file  Tobacco Use  . Smoking status: Never Smoker  . Smokeless tobacco: Never Used  Substance and Sexual Activity  . Alcohol use: No  . Drug use: No  . Sexual activity: Not on file  Other Topics Concern  . Not on file  Social History Narrative  . Not on file   Social Determinants of Health   Financial Resource Strain:   . Difficulty of Paying Living Expenses: Not on file  Food Insecurity:   . Worried About Programme researcher, broadcasting/film/video in the Last Year: Not on file  . Ran Out of Food in the Last Year: Not on file  Transportation Needs:   . Lack of Transportation (Medical): Not on file  . Lack of Transportation (Non-Medical): Not on file  Physical  Activity:   . Days of Exercise per Week: Not on file  . Minutes of Exercise per Session: Not on file  Stress:   . Feeling of Stress : Not on file  Social Connections:   . Frequency of Communication with Friends and Family: Not on file  . Frequency of Social Gatherings with Friends and Family: Not on file  . Attends Religious Services: Not on file  . Active Member of Clubs or Organizations: Not on file  . Attends Banker Meetings: Not on file  . Marital Status: Not on file  Intimate Partner Violence:   . Fear of Current or Ex-Partner: Not on file  . Emotionally Abused: Not on file  . Physically Abused: Not on file  . Sexually Abused: Not on file     Current Outpatient Medications:  .  Amantadine HCl 100 MG tablet, Take 100 mg by mouth 2 (two) times daily., Disp: , Rfl:  .  Cholecalciferol (VITAMIN D3) 1.25 MG (50000 UT) CAPS, Take 1.25 mg by mouth daily., Disp: , Rfl:  .  clonazePAM (KLONOPIN) 0.5 MG tablet, Take 0.5 mg by mouth at bedtime., Disp: , Rfl:  .  escitalopram (LEXAPRO) 10 MG tablet, Take 10 mg by mouth daily., Disp: , Rfl:  .  metFORMIN (GLUCOPHAGE) 1000 MG tablet, TAKE 1 TABLET BY MOUTH TWICE DAILY, Disp: 60 tablet, Rfl: 0 .  omeprazole (PRILOSEC) 20 MG capsule, Take 1 capsule (20 mg total) by mouth  daily., Disp: 30 capsule, Rfl: 6 .  risperiDONE (RISPERDAL) 3 MG tablet, Take 3 mg by mouth daily., Disp: , Rfl:  .  simethicone (MYLICON) 125 MG chewable tablet, Chew 125 mg by mouth 3 (three) times daily., Disp: , Rfl:  .  simvastatin (ZOCOR) 20 MG tablet, Take 1 tablet (20 mg total) by mouth daily., Disp: 30 tablet, Rfl: 6 .  sitaGLIPtin (JANUVIA) 100 MG tablet, Take 1 tablet (100 mg total) by mouth daily., Disp: 30 tablet, Rfl: 6 .  thioridazine (MELLARIL) 100 MG tablet, Take 100 mg by mouth 2 (two) times daily., Disp: , Rfl:    No Known Allergies  ROS Review of Systems  Constitutional: Positive for fatigue and weight loss.  Eyes: Negative for blurred  vision.  Cardiovascular: Negative for chest pain.  Neurological: Negative for headaches.  Psychiatric/Behavioral: Positive for confusion. The patient is not nervous/anxious.       Objective:    Physical Exam Vitals reviewed.  Constitutional:      Appearance: Normal appearance.  HENT:     Mouth/Throat:     Mouth: Mucous membranes are moist.  Eyes:     Pupils: Pupils are equal, round, and reactive to light.  Neck:     Vascular: No carotid bruit.  Cardiovascular:     Rate and Rhythm: Normal rate and regular rhythm.     Pulses: Normal pulses.     Heart sounds: Normal heart sounds.  Pulmonary:     Effort: Pulmonary effort is normal.     Breath sounds: Normal breath sounds.  Abdominal:     General: Bowel sounds are normal.     Palpations: Abdomen is soft. There is no hepatomegaly, splenomegaly or mass.     Tenderness: There is no abdominal tenderness.     Hernia: No hernia is present.  Musculoskeletal:     Cervical back: Neck supple.     Right lower leg: No edema.     Left lower leg: No edema.  Skin:    Findings: No rash.  Neurological:     Mental Status: He is alert and oriented to person, place, and time.     Motor: No weakness.  Psychiatric:        Mood and Affect: Mood normal.        Behavior: Behavior normal.     BP (!) 151/95   Pulse (!) 101   Ht 6\' 2"  (1.88 m)   Wt (!) 315 lb 8 oz (143.1 kg)   BMI 40.51 kg/m  Wt Readings from Last 3 Encounters:  08/21/20 (!) 315 lb 8 oz (143.1 kg)  05/13/20 (!) 325 lb 8 oz (147.6 kg)  07/11/17 (!) 302 lb 0.5 oz (137 kg)     Health Maintenance Due  Topic Date Due  . Hepatitis C Screening  Never done  . PNEUMOCOCCAL POLYSACCHARIDE VACCINE AGE 76-64 HIGH RISK  Never done  . FOOT EXAM  Never done  . OPHTHALMOLOGY EXAM  Never done  . URINE MICROALBUMIN  Never done  . COVID-19 Vaccine (1) Never done  . HIV Screening  Never done  . TETANUS/TDAP  Never done  . COLONOSCOPY  Never done  . INFLUENZA VACCINE  Never done     There are no preventive care reminders to display for this patient.  Lab Results  Component Value Date   TSH 1.40 05/13/2020   Lab Results  Component Value Date   WBC 4.8 05/13/2020   HGB 14.3 05/13/2020   HCT 44.1 05/13/2020  MCV 84.0 05/13/2020   PLT 189 05/13/2020   Lab Results  Component Value Date   NA 140 05/13/2020   K 4.4 05/13/2020   CO2 22 05/13/2020   GLUCOSE 75 05/13/2020   BUN 16 05/13/2020   CREATININE 1.86 (H) 05/13/2020   BILITOT 0.5 05/13/2020   ALKPHOS 73 07/11/2017   AST 13 05/13/2020   ALT 13 05/13/2020   PROT 6.8 05/13/2020   ALBUMIN 3.8 07/11/2017   CALCIUM 9.5 05/13/2020   ANIONGAP 10 07/11/2017   No results found for: CHOL No results found for: HDL No results found for: LDLCALC No results found for: TRIG No results found for: CHOLHDL Lab Results  Component Value Date   HGBA1C 5.4 05/13/2020      Assessment & Plan:   Problem List Items Addressed This Visit      Respiratory   Sleep apnea in adult    Pt uses  Sleep machine daily        Endocrine   Diabetes 1.5, 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 - The patient will continue the current treatment regimen.  - I encouraged the patient to regularly check blood sugar.  - I encouraged the patient to monitor diet. I encouraged the patient to eat low-carb and low-sugar to help prevent blood sugar spikes.  - I encouraged the patient to continue following their prescribed treatment plan for diabetes - I informed the patient to get help if blood sugar drops below 54mg /dL, or if suddenly have trouble thinking clearly or breathing.          Relevant Orders   POCT glucose (manual entry) (Completed)     Other   Obesity (BMI 35.0-39.9 without comorbidity)    - I encouraged the patient to lose weight.  - I educated them on making healthy dietary choices including eating more fruits and vegetables and less fried foods. - I  encouraged the patient to exercise more, and educated on the benefits of exercise including weight loss, diabetes prevention, and hypertension prevention.        Moderate mixed hyperlipidemia not requiring statin therapy    - The patient's hyperlipidemia is stable on statin. - The patient will continue the current treatment regimen.  - I encouraged the patient to eat more vegetables and whole wheat, and to avoid fatty foods like whole milk, hard cheese, egg yolks, margarine, baked sweets, and fried foods.  - I encouraged the patient to live an active lifestyle and complete activities for 40 minutes at least three times per week.  - I instructed the patient to go to the ER if they begin having chest pain.          No orders of the defined types were placed in this encounter.   Follow-up: No follow-ups on file.    11-04-1990, MD

## 2020-08-21 NOTE — Assessment & Plan Note (Signed)

## 2020-11-17 ENCOUNTER — Other Ambulatory Visit: Payer: Self-pay | Admitting: Internal Medicine

## 2020-11-20 ENCOUNTER — Other Ambulatory Visit: Payer: Self-pay

## 2020-11-20 ENCOUNTER — Ambulatory Visit (INDEPENDENT_AMBULATORY_CARE_PROVIDER_SITE_OTHER): Payer: Medicare Other | Admitting: Family Medicine

## 2020-11-20 ENCOUNTER — Encounter: Payer: Self-pay | Admitting: Family Medicine

## 2020-11-20 VITALS — BP 98/66 | HR 98 | Temp 97.0°F | Resp 18 | Ht 74.0 in | Wt 304.0 lb

## 2020-11-20 DIAGNOSIS — E139 Other specified diabetes mellitus without complications: Secondary | ICD-10-CM | POA: Diagnosis not present

## 2020-11-20 DIAGNOSIS — N1832 Chronic kidney disease, stage 3b: Secondary | ICD-10-CM | POA: Diagnosis not present

## 2020-11-20 DIAGNOSIS — Z1211 Encounter for screening for malignant neoplasm of colon: Secondary | ICD-10-CM | POA: Insufficient documentation

## 2020-11-20 DIAGNOSIS — E669 Obesity, unspecified: Secondary | ICD-10-CM

## 2020-11-20 DIAGNOSIS — Z0000 Encounter for general adult medical examination without abnormal findings: Secondary | ICD-10-CM | POA: Diagnosis not present

## 2020-11-20 DIAGNOSIS — Z23 Encounter for immunization: Secondary | ICD-10-CM | POA: Diagnosis not present

## 2020-11-20 DIAGNOSIS — G4730 Sleep apnea, unspecified: Secondary | ICD-10-CM | POA: Diagnosis not present

## 2020-11-20 NOTE — Assessment & Plan Note (Signed)
Diabetes mellitus Type II, under good control..  Taking all meds, will draw labs today.

## 2020-11-20 NOTE — Assessment & Plan Note (Signed)
Uses CPAP every night, sleeps well with the mask.

## 2020-11-20 NOTE — Progress Notes (Signed)
Established Patient Office Visit  SUBJECTIVE:  Subjective  Patient ID: Edwin [name], male    DOB: 23-Oct-1966  Age: 54 y.o. MRN: [id number]  CC:  Chief Complaint  Patient presents with  . Annual Exam  . Diabetes    Edwin [name] is a 54 y.o. male presenting today for     Past Medical History:  Diagnosis Date  . Diabetes mellitus without complication (HCC)     History reviewed. No pertinent surgical history.  History reviewed. No pertinent family history.  Social History   Socioeconomic History  . Marital status: Divorced    Spouse name: Not on file  . Number of children: Not on file  . Years of education: Not on file  . Highest education level: Not on file  Occupational History  . Not on file  Tobacco Use  . Smoking status: Never Smoker  . Smokeless tobacco: Never Used  Substance and Sexual Activity  . Alcohol use: No  . Drug use: No  . Sexual activity: Not on file  Other Topics Concern  . Not on file  Social History Narrative  . Not on file   Social Determinants of Health   Financial Resource Strain: Not on file  Food Insecurity: Not on file  Transportation Needs: Not on file  Physical Activity: Not on file  Stress: Not on file  Social Connections: Not on file  Intimate Partner Violence: Not on file     Current Outpatient Medications:  .  Amantadine HCl 100 MG tablet, Take 100 mg by mouth 2 (two) times daily., Disp: , Rfl:  .  Cholecalciferol (VITAMIN D3) 1.25 MG (50000 UT) CAPS, Take 1.25 mg by mouth daily., Disp: , Rfl:  .  clonazePAM (KLONOPIN) 0.5 MG tablet, Take 0.5 mg by mouth at bedtime., Disp: , Rfl:  .  escitalopram (LEXAPRO) 10 MG tablet, Take 10 mg by mouth daily., Disp: , Rfl:  .  metFORMIN (GLUCOPHAGE) 1000 MG tablet, TAKE 1 TABLET BY MOUTH TWICE DAILY, Disp: 60 tablet, Rfl: 0 .  omeprazole (PRILOSEC) 20 MG capsule, Take 1 capsule (20 mg total) by mouth daily., Disp: 30 capsule, Rfl: 6 .  risperiDONE (RISPERDAL) 3 MG tablet,  Take 3 mg by mouth daily., Disp: , Rfl:  .  simethicone (MYLICON) 125 MG chewable tablet, Chew 125 mg by mouth 3 (three) times daily., Disp: , Rfl:  .  simvastatin (ZOCOR) 20 MG tablet, Take 1 tablet (20 mg total) by mouth daily., Disp: 30 tablet, Rfl: 6 .  sitaGLIPtin (JANUVIA) 100 MG tablet, Take 1 tablet (100 mg total) by mouth daily., Disp: 30 tablet, Rfl: 6 .  thioridazine (MELLARIL) 100 MG tablet, Take 100 mg by mouth 2 (two) times daily., Disp: , Rfl:    No Known Allergies  ROS Review of Systems  Constitutional: Negative.   HENT: Negative.   Respiratory: Negative.   Cardiovascular: Negative.   Musculoskeletal: Negative.   Skin: Negative.   Neurological: Negative.   Psychiatric/Behavioral: Negative.      OBJECTIVE:    Physical Exam Vitals and nursing note reviewed.  Constitutional:      Appearance: Normal appearance. He is obese.  HENT:     Head: Normocephalic.     Right Ear: Tympanic membrane normal.     Left Ear: Tympanic membrane normal.  Eyes:     Pupils: Pupils are equal, round, and reactive to light.  Cardiovascular:     Rate and Rhythm: Normal rate and regular rhythm.  Pulses: Normal pulses.  Musculoskeletal:        General: Normal range of motion.     Cervical back: Normal range of motion.  Neurological:     Mental Status: He is oriented to person, place, and time.  Psychiatric:        Mood and Affect: Mood normal.     BP 98/66   Pulse 98   Temp (!) 97 F (36.1 C)   Resp 18   Ht 6\' 2"  (1.88 m)   Wt (!) 304 lb (137.9 kg)   BMI 39.03 kg/m  Wt Readings from Last 3 Encounters:  11/20/20 (!) 304 lb (137.9 kg)  08/21/20 (!) 315 lb 8 oz (143.1 kg)  05/13/20 (!) 325 lb 8 oz (147.6 kg)    Health Maintenance Due  Topic Date Due  . Hepatitis C Screening  Never done  . PNEUMOCOCCAL POLYSACCHARIDE VACCINE AGE 35-64 HIGH RISK  Never done  . FOOT EXAM  Never done  . OPHTHALMOLOGY EXAM  Never done  . URINE MICROALBUMIN  Never done  . COVID-19 Vaccine  (1) Never done  . HIV Screening  Never done  . TETANUS/TDAP  Never done  . COLONOSCOPY  Never done  . INFLUENZA VACCINE  Never done  . HEMOGLOBIN A1C  11/12/2020    There are no preventive care reminders to display for this patient.  CBC Latest Ref Rng & Units 05/13/2020 07/11/2017  WBC 3.8 - 10.8 Thousand/uL 4.8 5.2  Hemoglobin 13.2 - 17.1 g/dL 09/10/2017 64.6  Hematocrit 80.3 - 50.0 % 44.1 41.8  Platelets 140 - 400 Thousand/uL 189 278   CMP Latest Ref Rng & Units 05/13/2020 07/11/2017 07/11/2017  Glucose 65 - 99 mg/dL 75 09/10/2017) 248(G)  BUN 7 - 25 mg/dL 16 500(B) 70(W)  Creatinine 0.70 - 1.33 mg/dL 88(Q) 9.16(X) 4.50(T)  Sodium 135 - 146 mmol/L 140 139 139  Potassium 3.5 - 5.3 mmol/L 4.4 4.5 4.3  Chloride 98 - 110 mmol/L 105 106 107  CO2 20 - 32 mmol/L 22 23 22   Calcium 8.6 - 10.3 mg/dL 9.5 8.88(K) 9.1  Total Protein 6.1 - 8.1 g/dL 6.8 - 7.1  Total Bilirubin 0.2 - 1.2 mg/dL 0.5 - 0.7  Alkaline Phos 38 - 126 U/L - - 73  AST 10 - 35 U/L 13 - 24  ALT 9 - 46 U/L 13 - 16(L)    Lab Results  Component Value Date   TSH 1.40 05/13/2020   Lab Results  Component Value Date   ALBUMIN 3.8 07/11/2017   ANIONGAP 10 07/11/2017   No results found for: CHOL, HDL, LDLCALC, CHOLHDL No results found for: TRIG Lab Results  Component Value Date   HGBA1C 5.4 05/13/2020      ASSESSMENT & PLAN:   Problem List Items Addressed This Visit      Respiratory   Sleep apnea in adult     Other   Obesity (BMI 35.0-39.9 without comorbidity)   Moderate mixed hyperlipidemia not requiring statin therapy    Other Visit Diagnoses    Stage 3b chronic kidney disease (HCC)    -  Primary      No orders of the defined types were placed in this encounter.     Follow-up: No follow-ups on file.    07/13/2020, FNP [facility] [address], [place], [address] [place]

## 2020-11-20 NOTE — Assessment & Plan Note (Addendum)
Followed by [facility]. Abstains from NSAIDS, has apt scheduled with nephrology.

## 2020-11-20 NOTE — Assessment & Plan Note (Signed)
Not at goal, no current calorie restriction, many barriers described by care home employee due to MR and caloric intake.  Plan- High nutrition low carb foods discussed. Food restriction during non meal times.

## 2020-11-21 LAB — COMPLETE METABOLIC PANEL WITH GFR
AG Ratio: 1.5 (calc) (ref 1.0–2.5)
ALT: 10 U/L (ref 9–46)
AST: 11 U/L (ref 10–35)
Albumin: 3.8 g/dL (ref 3.6–5.1)
Alkaline phosphatase (APISO): 76 U/L (ref 35–144)
BUN/Creatinine Ratio: 9 (calc) (ref 6–22)
BUN: 15 mg/dL (ref 7–25)
CO2: 21 mmol/L (ref 20–32)
Calcium: 9.1 mg/dL (ref 8.6–10.3)
Chloride: 109 mmol/L (ref 98–110)
Creat: 1.58 mg/dL — ABNORMAL HIGH (ref 0.70–1.33)
GFR, Est African American: 56 mL/min/{1.73_m2} — ABNORMAL LOW (ref >=60)
GFR, Est Non African American: 49 mL/min/{1.73_m2} — ABNORMAL LOW (ref >=60)
Globulin: 2.6 g/dL (calc) (ref 1.9–3.7)
Glucose, Bld: 80 mg/dL (ref 65–99)
Potassium: 4.3 mmol/L (ref 3.5–5.3)
Sodium: 143 mmol/L (ref 135–146)
Total Bilirubin: 0.4 mg/dL (ref 0.2–1.2)
Total Protein: 6.4 g/dL (ref 6.1–8.1)

## 2020-11-21 LAB — HEMOGLOBIN A1C
Hgb A1c MFr Bld: 5.4 % of total Hgb (ref <5.7)
Mean Plasma Glucose: 108 mg/dL
eAG (mmol/L): 6 mmol/L

## 2020-11-21 LAB — PSA: PSA: 1.79 ng/mL (ref <=4.0)

## 2020-12-18 ENCOUNTER — Other Ambulatory Visit: Payer: Self-pay

## 2020-12-18 ENCOUNTER — Encounter: Payer: Self-pay | Admitting: Family Medicine

## 2020-12-18 ENCOUNTER — Ambulatory Visit (INDEPENDENT_AMBULATORY_CARE_PROVIDER_SITE_OTHER): Payer: Medicare Other | Admitting: Family Medicine

## 2020-12-18 DIAGNOSIS — E1169 Type 2 diabetes mellitus with other specified complication: Secondary | ICD-10-CM

## 2020-12-18 DIAGNOSIS — N1831 Chronic kidney disease, stage 3a: Secondary | ICD-10-CM | POA: Insufficient documentation

## 2020-12-18 DIAGNOSIS — E139 Other specified diabetes mellitus without complications: Secondary | ICD-10-CM | POA: Diagnosis not present

## 2020-12-18 DIAGNOSIS — E1122 Type 2 diabetes mellitus with diabetic chronic kidney disease: Secondary | ICD-10-CM | POA: Insufficient documentation

## 2020-12-18 DIAGNOSIS — E669 Obesity, unspecified: Secondary | ICD-10-CM | POA: Diagnosis not present

## 2020-12-18 NOTE — Assessment & Plan Note (Signed)
Patient was in a Day program that he had more movement and exercise but bc of pandemic that has been paused. Discussed diet and exrecise, suggested mall walking in the winter months.

## 2020-12-18 NOTE — Assessment & Plan Note (Signed)
Glucose was 89 yesterday and 80 today, last A1C was 5.4, he is doing well on all medications. Diet is not going well but because of Developmental delay he has difficulty understanding dietary restrictions.   

## 2020-12-18 NOTE — Assessment & Plan Note (Signed)
Glucose was 89 yesterday and 80 today, last A1C was 5.4, he is doing well on all medications. Diet is not going well but because of Developmental delay he has difficulty understanding dietary restrictions.

## 2020-12-18 NOTE — Progress Notes (Signed)
Established Patient Office Visit  SUBJECTIVE:  Subjective  Patient ID: Edwin [name], male    DOB: [birth year]  Age: 55 y.o. MRN: [id number]  CC: No chief complaint on file.   HPI Edwin [name] is a 55 y.o. male presenting today for     Past Medical History:  Diagnosis Date  . Diabetes mellitus without complication (HCC)     History reviewed. No pertinent surgical history.  History reviewed. No pertinent family history.  Social History   Socioeconomic History  . Marital status: Divorced    Spouse name: Not on file  . Number of children: Not on file  . Years of education: Not on file  . Highest education level: Not on file  Occupational History  . Not on file  Tobacco Use  . Smoking status: Never Smoker  . Smokeless tobacco: Never Used  Substance and Sexual Activity  . Alcohol use: No  . Drug use: No  . Sexual activity: Not on file  Other Topics Concern  . Not on file  Social History Narrative  . Not on file   Social Determinants of Health   Financial Resource Strain: Not on file  Food Insecurity: Not on file  Transportation Needs: Not on file  Physical Activity: Not on file  Stress: Not on file  Social Connections: Not on file  Intimate Partner Violence: Not on file     Current Outpatient Medications:  .  Amantadine HCl 100 MG tablet, Take 100 mg by mouth 2 (two) times daily., Disp: , Rfl:  .  Cholecalciferol (VITAMIN D3) 1.25 MG (50000 UT) CAPS, Take 1.25 mg by mouth daily., Disp: , Rfl:  .  clonazePAM (KLONOPIN) 0.5 MG tablet, Take 0.5 mg by mouth at bedtime., Disp: , Rfl:  .  escitalopram (LEXAPRO) 10 MG tablet, Take 10 mg by mouth daily., Disp: , Rfl:  .  metFORMIN (GLUCOPHAGE) 1000 MG tablet, TAKE 1 TABLET BY MOUTH TWICE DAILY, Disp: 60 tablet, Rfl: 0 .  omeprazole (PRILOSEC) 20 MG capsule, Take 1 capsule (20 mg total) by mouth daily., Disp: 30 capsule, Rfl: 6 .  risperiDONE (RISPERDAL) 3 MG tablet, Take 3 mg by mouth daily., Disp: , Rfl:  .   simethicone (MYLICON) 125 MG chewable tablet, Chew 125 mg by mouth 3 (three) times daily., Disp: , Rfl:  .  simvastatin (ZOCOR) 20 MG tablet, Take 1 tablet (20 mg total) by mouth daily., Disp: 30 tablet, Rfl: 6 .  sitaGLIPtin (JANUVIA) 100 MG tablet, Take 1 tablet (100 mg total) by mouth daily., Disp: 30 tablet, Rfl: 6 .  thioridazine (MELLARIL) 100 MG tablet, Take 100 mg by mouth 2 (two) times daily., Disp: , Rfl:    No Known Allergies  ROS Review of Systems  Constitutional: Negative.   HENT: Negative.   Eyes: Negative.   Respiratory: Negative.   Genitourinary: Negative.   Skin: Negative.   Neurological: Negative.   Psychiatric/Behavioral: Positive for decreased concentration (Normal developmental delay).     OBJECTIVE:    Physical Exam Vitals and nursing note reviewed.  Constitutional:      Appearance: Normal appearance.  HENT:     Head: Normocephalic.     Right Ear: Tympanic membrane normal.     Left Ear: Tympanic membrane normal.     Nose: Nose normal.     Mouth/Throat:     Mouth: Mucous membranes are moist.  Eyes:     Pupils: Pupils are equal, round, and reactive to light.  Cardiovascular:  Rate and Rhythm: Normal rate and regular rhythm.  Pulmonary:     Effort: Pulmonary effort is normal.  Abdominal:     General: Abdomen is flat.  Musculoskeletal:        General: Normal range of motion.  Skin:    General: Skin is warm.     Capillary Refill: Capillary refill takes less than 2 seconds.  Neurological:     General: No focal deficit present.     Mental Status: He is alert.  Psychiatric:     Comments: Per norm developmental delay     BP 109/76   Pulse 96   Temp (!) 96.8 F (36 C)   Resp 17   Ht 6\' 2"  (1.88 m)   Wt (!) 303 lb (137.4 kg)   SpO2 96%   BMI 38.90 kg/m  Wt Readings from Last 3 Encounters:  12/18/20 (!) 303 lb (137.4 kg)  11/20/20 (!) 304 lb (137.9 kg)  08/21/20 (!) 315 lb 8 oz (143.1 kg)    Health Maintenance Due  Topic Date Due  .  Hepatitis C Screening  Never done  . PNEUMOCOCCAL POLYSACCHARIDE VACCINE AGE 1-64 HIGH RISK  Never done  . COVID-19 Vaccine (1) Never done  . FOOT EXAM  Never done  . OPHTHALMOLOGY EXAM  Never done  . URINE MICROALBUMIN  Never done  . HIV Screening  Never done  . COLONOSCOPY (Pts 45-25yrs Insurance coverage will need to be confirmed)  Never done  . INFLUENZA VACCINE  Never done    There are no preventive care reminders to display for this patient.  CBC Latest Ref Rng & Units 05/13/2020 07/11/2017  WBC 3.8 - 10.8 Thousand/uL 4.8 5.2  Hemoglobin 13.2 - 17.1 g/dL 09/10/2017 63.1  Hematocrit 49.7 - 50.0 % 44.1 41.8  Platelets 140 - 400 Thousand/uL 189 278   CMP Latest Ref Rng & Units 11/20/2020 05/13/2020 07/11/2017  Glucose 65 - 99 mg/dL 80 75 09/10/2017)  BUN 7 - 25 mg/dL 15 16 378(H)  Creatinine 0.70 - 1.33 mg/dL 88(F) 0.27(X) 4.12(I)  Sodium 135 - 146 mmol/L 143 140 139  Potassium 3.5 - 5.3 mmol/L 4.3 4.4 4.5  Chloride 98 - 110 mmol/L 109 105 106  CO2 20 - 32 mmol/L 21 22 23   Calcium 8.6 - 10.3 mg/dL 9.1 9.5 7.86(V)  Total Protein 6.1 - 8.1 g/dL 6.4 6.8 -  Total Bilirubin 0.2 - 1.2 mg/dL 0.4 0.5 -  Alkaline Phos 38 - 126 U/L - - -  AST 10 - 35 U/L 11 13 -  ALT 9 - 46 U/L 10 13 -    Lab Results  Component Value Date   TSH 1.40 05/13/2020   Lab Results  Component Value Date   ALBUMIN 3.8 07/11/2017   ANIONGAP 10 07/11/2017   No results found for: CHOL, HDL, LDLCALC, CHOLHDL No results found for: TRIG Lab Results  Component Value Date   HGBA1C 5.4 11/20/2020   HGBA1C 5.4 05/13/2020      ASSESSMENT & PLAN:   Problem List Items Addressed This Visit      Endocrine   Diabetes 1.5, 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 Diet is not going well but because of Developmental delay he has difficulty understanding dietary restrictions.         Other   Obesity (BMI 35.0-39.9 without comorbidity)     Patient  was in a Day program that he had more movement and exercise but bc of pandemic that has been paused. Discussed diet and exrecise, suggested mall walking in the winter months.          No orders of the defined types were placed in this encounter.     Follow-up: No follow-ups on file.    [name], FNP [facility] [address] [place], [place] [address]

## 2021-02-02 ENCOUNTER — Other Ambulatory Visit: Payer: Self-pay | Admitting: Internal Medicine

## 2021-02-04 ENCOUNTER — Other Ambulatory Visit: Payer: Self-pay | Admitting: Internal Medicine

## 2021-03-01 ENCOUNTER — Other Ambulatory Visit: Payer: Self-pay | Admitting: Internal Medicine

## 2021-03-05 ENCOUNTER — Other Ambulatory Visit: Payer: Self-pay | Admitting: Internal Medicine

## 2021-03-05 ENCOUNTER — Other Ambulatory Visit: Payer: Self-pay | Admitting: *Deleted

## 2021-03-11 ENCOUNTER — Other Ambulatory Visit: Payer: Self-pay | Admitting: Internal Medicine

## 2021-03-18 ENCOUNTER — Other Ambulatory Visit: Payer: Self-pay

## 2021-03-18 ENCOUNTER — Encounter: Payer: Self-pay | Admitting: Internal Medicine

## 2021-03-18 ENCOUNTER — Ambulatory Visit (INDEPENDENT_AMBULATORY_CARE_PROVIDER_SITE_OTHER): Payer: Medicare Other | Admitting: Internal Medicine

## 2021-03-18 VITALS — BP 123/83 | HR 89 | Ht 74.0 in | Wt 299.6 lb

## 2021-03-18 DIAGNOSIS — R159 Full incontinence of feces: Secondary | ICD-10-CM

## 2021-03-18 DIAGNOSIS — Z0000 Encounter for general adult medical examination without abnormal findings: Secondary | ICD-10-CM | POA: Diagnosis not present

## 2021-03-18 DIAGNOSIS — E669 Obesity, unspecified: Secondary | ICD-10-CM | POA: Diagnosis not present

## 2021-03-18 DIAGNOSIS — N1832 Chronic kidney disease, stage 3b: Secondary | ICD-10-CM

## 2021-03-18 DIAGNOSIS — G4730 Sleep apnea, unspecified: Secondary | ICD-10-CM

## 2021-03-18 DIAGNOSIS — E1169 Type 2 diabetes mellitus with other specified complication: Secondary | ICD-10-CM

## 2021-03-18 DIAGNOSIS — E782 Mixed hyperlipidemia: Secondary | ICD-10-CM

## 2021-03-18 NOTE — Assessment & Plan Note (Signed)
Patient is using CPAP machine 

## 2021-03-18 NOTE — Progress Notes (Signed)
Established Patient Office Visit  Subjective:  Patient ID: Edwin [name], male    DOB: 03-Apr-1966  Age: 55 y.o. MRN: [id number]  CC:  Chief Complaint  Patient presents with  . urine incontinence    HPI  Edwin [name] presents forcheck up  Pt has problem  With faecal incontinence, he will be reffered  To  GI  Past Medical History:  Diagnosis Date  . Diabetes mellitus without complication (HCC)     No past surgical history on file.  No family history on file.  Social History   Socioeconomic History  . Marital status: Divorced    Spouse name: Not on file  . Number of children: Not on file  . Years of education: Not on file  . Highest education level: Not on file  Occupational History  . Not on file  Tobacco Use  . Smoking status: Never Smoker  . Smokeless tobacco: Never Used  Substance and Sexual Activity  . Alcohol use: No  . Drug use: No  . Sexual activity: Not on file  Other Topics Concern  . Not on file  Social History Narrative  . Not on file   Social Determinants of Health   Financial Resource Strain: Not on file  Food Insecurity: Not on file  Transportation Needs: Not on file  Physical Activity: Not on file  Stress: Not on file  Social Connections: Not on file  Intimate Partner Violence: Not on file     Current Outpatient Medications:  .  Amantadine HCl 100 MG tablet, Take 100 mg by mouth 2 (two) times daily., Disp: , Rfl:  .  Cholecalciferol (VITAMIN D3) 1.25 MG (50000 UT) CAPS, Take 1.25 mg by mouth daily., Disp: , Rfl:  .  Cholecalciferol (VITAMIN D3) 50 MCG (2000 UT) TABS, TAKE 1 TABLET BY MOUTH ONCE DAILY., Disp: 30 tablet, Rfl: 0 .  clonazePAM (KLONOPIN) 0.5 MG tablet, Take 0.5 mg by mouth at bedtime., Disp: , Rfl:  .  escitalopram (LEXAPRO) 10 MG tablet, Take 10 mg by mouth daily., Disp: , Rfl:  .  GNP GAS RELIEF 80 MG chewable tablet, CHEW 1 TABLET BY MOUTH THREE TIMES DAILY BEFORE MEALS AND AT BEDTIME AS NEEDED., Disp: 30 tablet, Rfl:  0 .  JANUVIA 100 MG tablet, TAKE 1 TABLET BY MOUTH ONCE DAILY., Disp: 15 tablet, Rfl: 0 .  metFORMIN (GLUCOPHAGE) 1000 MG tablet, TAKE 1 TABLET BY MOUTH TWICE DAILY, Disp: 60 tablet, Rfl: 0 .  omeprazole (PRILOSEC) 20 MG capsule, TAKE (1) CAPSULE BY MOUTH ONCE DAILY., Disp: 15 capsule, Rfl: 0 .  risperiDONE (RISPERDAL) 3 MG tablet, Take 3 mg by mouth daily., Disp: , Rfl:  .  simethicone (MYLICON) 125 MG chewable tablet, Chew 125 mg by mouth 3 (three) times daily., Disp: , Rfl:  .  simvastatin (ZOCOR) 20 MG tablet, TAKE 1 TABLET BY MOUTH ONCE DAILY., Disp: 15 tablet, Rfl: 0 .  thioridazine (MELLARIL) 100 MG tablet, Take 100 mg by mouth 2 (two) times daily., Disp: , Rfl:    No Known Allergies  ROS Review of Systems  Constitutional: Negative.   HENT: Negative.   Eyes: Negative.   Respiratory: Negative.        Pt using  cpap  Cardiovascular: Negative.   Gastrointestinal: Negative.   Endocrine: Negative.   Genitourinary: Negative.   Musculoskeletal: Negative.   Skin: Negative.   Allergic/Immunologic: Negative.   Neurological: Negative.   Hematological: Negative.   Psychiatric/Behavioral: Negative.   All other systems  reviewed and are negative.     Objective:    Physical Exam Vitals reviewed.  Constitutional:      Appearance: Normal appearance.  HENT:     Mouth/Throat:     Mouth: Mucous membranes are moist.  Eyes:     Pupils: Pupils are equal, round, and reactive to light.  Neck:     Vascular: No carotid bruit.  Cardiovascular:     Rate and Rhythm: Normal rate and regular rhythm.     Pulses: Normal pulses.     Heart sounds: Normal heart sounds.  Pulmonary:     Effort: Pulmonary effort is normal.     Breath sounds: Normal breath sounds.  Abdominal:     General: Bowel sounds are normal.     Palpations: Abdomen is soft. There is no hepatomegaly, splenomegaly or mass.     Tenderness: There is no abdominal tenderness.     Hernia: No hernia is present.  Musculoskeletal:      Cervical back: Neck supple.     Right lower leg: No edema.     Left lower leg: No edema.  Skin:    Findings: No rash.  Neurological:     Mental Status: He is alert and oriented to person, place, and time.     Motor: No weakness.  Psychiatric:        Mood and Affect: Mood normal.        Behavior: Behavior normal.     BP 123/83   Pulse 89   Ht 6\' 2"  (1.88 m)   Wt 299 lb 9.6 oz (135.9 kg)   BMI 38.47 kg/m  Wt Readings from Last 3 Encounters:  03/18/21 299 lb 9.6 oz (135.9 kg)  12/18/20 (!) 303 lb (137.4 kg)  11/20/20 (!) 304 lb (137.9 kg)     Health Maintenance Due  Topic Date Due  . Hepatitis C Screening  Never done  . PNEUMOCOCCAL POLYSACCHARIDE VACCINE AGE 56-64 HIGH RISK  Never done  . COVID-19 Vaccine (1) Never done  . FOOT EXAM  Never done  . OPHTHALMOLOGY EXAM  Never done  . URINE MICROALBUMIN  Never done  . HIV Screening  Never done  . COLONOSCOPY (Pts 45-51yrs Insurance coverage will need to be confirmed)  Never done    There are no preventive care reminders to display for this patient.  Lab Results  Component Value Date   TSH 1.40 05/13/2020   Lab Results  Component Value Date   WBC 4.8 05/13/2020   HGB 14.3 05/13/2020   HCT 44.1 05/13/2020   MCV 84.0 05/13/2020   PLT 189 05/13/2020   Lab Results  Component Value Date   NA 143 11/20/2020   K 4.3 11/20/2020   CO2 21 11/20/2020   GLUCOSE 80 11/20/2020   BUN 15 11/20/2020   CREATININE 1.58 (H) 11/20/2020   BILITOT 0.4 11/20/2020   ALKPHOS 73 07/11/2017   AST 11 11/20/2020   ALT 10 11/20/2020   PROT 6.4 11/20/2020   ALBUMIN 3.8 07/11/2017   CALCIUM 9.1 11/20/2020   ANIONGAP 10 07/11/2017   No results found for: CHOL No results found for: HDL No results found for: LDLCALC No results found for: TRIG No results found for: CHOLHDL Lab Results  Component Value Date   HGBA1C 5.4 11/20/2020      Assessment & Plan:   Problem List Items Addressed This Visit   None     No orders of  the defined types were placed in this encounter.  Follow-up: No follow-ups on file.    Marquis Diles, MD 

## 2021-03-18 NOTE — Addendum Note (Signed)
Addended by: [name] on: 03/18/2021 09:28 AM   Modules accepted: Orders

## 2021-03-18 NOTE — Assessment & Plan Note (Addendum)
-   The patient's blood sugar is not under control on med. - The patient will continue the current treatment regimen.  - I encouraged the patient to regularly check blood sugar.  - I encouraged the patient to monitor diet. I encouraged the patient to eat low-carb and low-sugar to help prevent blood sugar spikes.  - I encouraged the patient to continue following their prescribed treatment plan for diabetes - I informed the patient to get help if blood sugar drops below 54mg/dL, or if suddenly have trouble thinking clearly or breathing.  

## 2021-03-18 NOTE — Assessment & Plan Note (Signed)

## 2021-03-18 NOTE — Assessment & Plan Note (Signed)
Pt is seeing  nephrologist

## 2021-03-18 NOTE — Assessment & Plan Note (Signed)
Hypercholesterolemia  I advised the patient to follow Mediterranean diet This diet is rich in fruits vegetables and whole grain, and This diet is also rich in fish and lean meat Patient should also eat a handful of almonds or walnuts daily Recent heart study indicated that average follow-up on this kind of diet reduces the cardiovascular mortality by 50 to 70%== 

## 2021-03-18 NOTE — Addendum Note (Signed)
Addended by: Jobie Quaker on: 03/18/2021 09:52 AM   Modules accepted: Orders

## 2021-03-22 ENCOUNTER — Encounter: Payer: Self-pay | Admitting: *Deleted

## 2021-03-23 ENCOUNTER — Encounter: Payer: Self-pay | Admitting: *Deleted

## 2021-03-26 ENCOUNTER — Other Ambulatory Visit: Payer: Self-pay

## 2021-03-26 DIAGNOSIS — R32 Unspecified urinary incontinence: Secondary | ICD-10-CM

## 2021-03-26 NOTE — Progress Notes (Signed)
amb  

## 2021-04-01 NOTE — Assessment & Plan Note (Signed)
Patient was advised to lose weight.  Follow-up ADA diet

## 2021-04-12 ENCOUNTER — Other Ambulatory Visit: Payer: Self-pay | Admitting: Internal Medicine

## 2021-04-14 ENCOUNTER — Ambulatory Visit: Payer: Medicare Other | Admitting: Internal Medicine

## 2021-04-16 ENCOUNTER — Other Ambulatory Visit: Payer: Self-pay

## 2021-04-16 ENCOUNTER — Encounter: Payer: Self-pay | Admitting: Family Medicine

## 2021-04-16 ENCOUNTER — Ambulatory Visit (INDEPENDENT_AMBULATORY_CARE_PROVIDER_SITE_OTHER): Payer: Medicare Other | Admitting: Family Medicine

## 2021-04-16 VITALS — BP 130/82 | HR 87 | Ht 74.0 in | Wt 283.1 lb

## 2021-04-16 DIAGNOSIS — E669 Obesity, unspecified: Secondary | ICD-10-CM | POA: Diagnosis not present

## 2021-04-16 DIAGNOSIS — G4730 Sleep apnea, unspecified: Secondary | ICD-10-CM | POA: Diagnosis not present

## 2021-04-16 DIAGNOSIS — N1831 Chronic kidney disease, stage 3a: Secondary | ICD-10-CM | POA: Diagnosis not present

## 2021-04-16 DIAGNOSIS — E1122 Type 2 diabetes mellitus with diabetic chronic kidney disease: Secondary | ICD-10-CM | POA: Diagnosis not present

## 2021-04-16 DIAGNOSIS — E1169 Type 2 diabetes mellitus with other specified complication: Secondary | ICD-10-CM

## 2021-04-16 LAB — POCT GLYCOSYLATED HEMOGLOBIN (HGB A1C): HbA1c POC (<> result, manual entry): 5.6 % (ref 4.0–5.6)

## 2021-04-16 NOTE — Assessment & Plan Note (Signed)
Lost 16 lbs in 1 month, walking and eating better, decreased portion size.

## 2021-04-16 NOTE — Progress Notes (Signed)
a1c

## 2021-04-16 NOTE — Assessment & Plan Note (Signed)
Diabetes mellitus Type II, under excellent control.. Discussed general issues about diabetes pathophysiology and management.  A1C today was 5.6 after stopping metformin 1 month ago due to renal protection. GFR is 56 compared to 1 year ago at 63.

## 2021-04-16 NOTE — Progress Notes (Signed)
Established Patient Office Visit  SUBJECTIVE:  Subjective  Patient ID: GENERAL WEARING, male    DOB: [birth year]  Age: 55 y.o. MRN: [id number]  CC:  Chief Complaint  Patient presents with  . follow up after stopping metformin    Patient was taken off metformin a couple of weeks ago and is here today to follow up. Patient's blood sugar was 83 today.     HPI LOTUS GOVER is a 55 y.o. male presenting today for     Past Medical History:  Diagnosis Date  . Diabetes mellitus without complication (HCC)     History reviewed. No pertinent surgical history.  History reviewed. No pertinent family history.  Social History   Socioeconomic History  . Marital status: Divorced    Spouse name: Not on file  . Number of children: Not on file  . Years of education: Not on file  . Highest education level: Not on file  Occupational History  . Not on file  Tobacco Use  . Smoking status: Never Smoker  . Smokeless tobacco: Never Used  Substance and Sexual Activity  . Alcohol use: No  . Drug use: No  . Sexual activity: Not on file  Other Topics Concern  . Not on file  Social History Narrative  . Not on file   Social Determinants of Health   Financial Resource Strain: Not on file  Food Insecurity: Not on file  Transportation Needs: Not on file  Physical Activity: Not on file  Stress: Not on file  Social Connections: Not on file  Intimate Partner Violence: Not on file     Current Outpatient Medications:  .  Amantadine HCl 100 MG tablet, Take 100 mg by mouth 2 (two) times daily., Disp: , Rfl:  .  Cholecalciferol (VITAMIN D3) 1.25 MG (50000 UT) CAPS, Take 1.25 mg by mouth daily., Disp: , Rfl:  .  Cholecalciferol (VITAMIN D3) 50 MCG (2000 UT) TABS, TAKE 1 TABLET BY MOUTH ONCE DAILY., Disp: 30 tablet, Rfl: 0 .  clonazePAM (KLONOPIN) 0.5 MG tablet, Take 0.5 mg by mouth at bedtime., Disp: , Rfl:  .  escitalopram (LEXAPRO) 10 MG tablet, Take 10 mg by mouth daily., Disp: , Rfl:  .   GNP GAS RELIEF 80 MG chewable tablet, CHEW 1 TABLET BY MOUTH THREE TIMES DAILY BEFORE MEALS AND AT BEDTIME AS NEEDED., Disp: 30 tablet, Rfl: 0 .  JANUVIA 100 MG tablet, TAKE 1 TABLET BY MOUTH ONCE DAILY., Disp: 15 tablet, Rfl: 0 .  metFORMIN (GLUCOPHAGE) 1000 MG tablet, TAKE 1 TABLET BY MOUTH TWICE DAILY, Disp: 60 tablet, Rfl: 0 .  omeprazole (PRILOSEC) 20 MG capsule, TAKE (1) CAPSULE BY MOUTH ONCE DAILY., Disp: 15 capsule, Rfl: 0 .  risperiDONE (RISPERDAL) 3 MG tablet, Take 3 mg by mouth daily., Disp: , Rfl:  .  simethicone (MYLICON) 125 MG chewable tablet, Chew 125 mg by mouth 3 (three) times daily., Disp: , Rfl:  .  simvastatin (ZOCOR) 20 MG tablet, TAKE 1 TABLET BY MOUTH ONCE DAILY., Disp: 15 tablet, Rfl: 0 .  thioridazine (MELLARIL) 100 MG tablet, Take 100 mg by mouth 2 (two) times daily., Disp: , Rfl:    No Known Allergies  ROS Review of Systems  Constitutional: Negative.   HENT: Negative.   Respiratory: Negative.   Cardiovascular: Negative.   Musculoskeletal: Negative.   Psychiatric/Behavioral: Negative.      OBJECTIVE:    Physical Exam Vitals reviewed.  Constitutional:      Appearance: He is  obese.  HENT:     Head: Normocephalic and atraumatic.     Nose: Nose normal.  Cardiovascular:     Rate and Rhythm: Normal rate and regular rhythm.  Pulmonary:     Effort: Pulmonary effort is normal.  Musculoskeletal:     Cervical back: Normal range of motion.  Skin:    General: Skin is warm.  Neurological:     Mental Status: He is alert.  Psychiatric:        Mood and Affect: Mood normal.     BP 130/82   Pulse 87   Ht 6\' 2"  (1.88 m)   Wt 283 lb 1.6 oz (128.4 kg)   BMI 36.35 kg/m  Wt Readings from Last 3 Encounters:  04/16/21 283 lb 1.6 oz (128.4 kg)  03/18/21 299 lb 9.6 oz (135.9 kg)  12/18/20 (!) 303 lb (137.4 kg)    Health Maintenance Due  Topic Date Due  . PNEUMOCOCCAL POLYSACCHARIDE VACCINE AGE 67-64 HIGH RISK  Never done  . COVID-19 Vaccine (1) Never done  .  FOOT EXAM  Never done  . OPHTHALMOLOGY EXAM  Never done  . URINE MICROALBUMIN  Never done  . HIV Screening  Never done  . Hepatitis C Screening  Never done  . COLONOSCOPY (Pts 45-26yrs Insurance coverage will need to be confirmed)  Never done    There are no preventive care reminders to display for this patient.  CBC Latest Ref Rng & Units 05/13/2020 07/11/2017  WBC 3.8 - 10.8 Thousand/uL 4.8 5.2  Hemoglobin 13.2 - 17.1 g/dL 09/10/2017 81.0  Hematocrit 17.5 - 50.0 % 44.1 41.8  Platelets 140 - 400 Thousand/uL 189 278   CMP Latest Ref Rng & Units 11/20/2020 05/13/2020 07/11/2017  Glucose 65 - 99 mg/dL 80 75 09/10/2017)  BUN 7 - 25 mg/dL 15 16 585(I)  Creatinine 0.70 - 1.33 mg/dL 77(O) 2.42(P) 5.36(R)  Sodium 135 - 146 mmol/L 143 140 139  Potassium 3.5 - 5.3 mmol/L 4.3 4.4 4.5  Chloride 98 - 110 mmol/L 109 105 106  CO2 20 - 32 mmol/L 21 22 23   Calcium 8.6 - 10.3 mg/dL 9.1 9.5 4.43(X)  Total Protein 6.1 - 8.1 g/dL 6.4 6.8 -  Total Bilirubin 0.2 - 1.2 mg/dL 0.4 0.5 -  Alkaline Phos 38 - 126 U/L - - -  AST 10 - 35 U/L 11 13 -  ALT 9 - 46 U/L 10 13 -    Lab Results  Component Value Date   TSH 1.40 05/13/2020   Lab Results  Component Value Date   ALBUMIN 3.8 07/11/2017   ANIONGAP 10 07/11/2017   No results found for: CHOL, HDL, LDLCALC, CHOLHDL No results found for: TRIG Lab Results  Component Value Date   HGBA1C 5.6 04/16/2021   HGBA1C 5.4 11/20/2020   HGBA1C 5.4 05/13/2020      ASSESSMENT & PLAN:   Problem List Items Addressed This Visit      Respiratory   Sleep apnea in adult    Uses CPAP every night, last 16 lbs since last visit.         Endocrine   Type 2 diabetes mellitus with stage 3a chronic kidney disease, without long-term current use of insulin (HCC) - Primary    Diabetes mellitus Type II, under excellent control.. Discussed general issues about diabetes pathophysiology and management.  A1C today was 5.6 after stopping metformin 1 month ago due to renal protection. GFR  is 56 compared to 1 year ago at 33.  Relevant Orders   POCT HgB A1C (Completed)     Other   Obesity (BMI 35.0-39.9 without comorbidity)    Lost 16 lbs in 1 month, walking and eating better, decreased portion size.          No orders of the defined types were placed in this encounter.     Follow-up: No follow-ups on file.    [name], FNP [facility] [address] [place], [place] [address]

## 2021-04-16 NOTE — Assessment & Plan Note (Signed)
Uses CPAP every night, last 16 lbs since last visit.

## 2021-05-05 ENCOUNTER — Other Ambulatory Visit: Payer: Self-pay | Admitting: Internal Medicine

## 2021-05-10 ENCOUNTER — Ambulatory Visit: Payer: Self-pay | Admitting: Urology

## 2021-05-28 ENCOUNTER — Other Ambulatory Visit: Payer: Self-pay | Admitting: Internal Medicine

## 2021-05-31 ENCOUNTER — Encounter: Payer: Self-pay | Admitting: Urology

## 2021-05-31 ENCOUNTER — Other Ambulatory Visit: Payer: Self-pay

## 2021-05-31 ENCOUNTER — Ambulatory Visit (INDEPENDENT_AMBULATORY_CARE_PROVIDER_SITE_OTHER): Payer: Medicare Other | Admitting: Urology

## 2021-05-31 VITALS — BP 130/84 | HR 92 | Ht 74.0 in | Wt 302.0 lb

## 2021-05-31 DIAGNOSIS — N401 Enlarged prostate with lower urinary tract symptoms: Secondary | ICD-10-CM

## 2021-05-31 DIAGNOSIS — N3941 Urge incontinence: Secondary | ICD-10-CM | POA: Diagnosis not present

## 2021-05-31 DIAGNOSIS — R350 Frequency of micturition: Secondary | ICD-10-CM

## 2021-05-31 DIAGNOSIS — R32 Unspecified urinary incontinence: Secondary | ICD-10-CM | POA: Diagnosis not present

## 2021-05-31 LAB — BLADDER SCAN AMB NON-IMAGING: Scan Result: 21

## 2021-05-31 MED ORDER — TAMSULOSIN HCL 0.4 MG PO CAPS: 0.4000 mg | ORAL_CAPSULE | Freq: Every day | ORAL | 0 refills | Status: DC

## 2021-05-31 NOTE — Progress Notes (Signed)
05/31/2021 8:49 AM   [name] Sep 06, 1966 [id number]  Referring provider: [name], MD [address] [place],  [place] [address]  Chief Complaint  Patient presents with   Urinary Incontinence    HPI: Edwin [name] is a 55 y.o. male referred for evaluation of urinary incontinence.  He presents today with his caregiver who provided the majority of the history.  1 year history of urinary frequency, urgency and urge incontinence Voids with a good stream, no dysuria or gross hematuria Denies prior history of UTI or dysuria No prior urologic history or urologic evaluation PSA 11/20/2020 was 1.79  PMH: Past Medical History:  Diagnosis Date   Diabetes mellitus without complication (HCC)     Surgical History: History reviewed. No pertinent surgical history.  Home Medications:  Allergies as of 05/31/2021       Reactions   Citrullus Vulgaris Hives        Medication List        Accurate as of May 31, 2021  8:49 AM. If you have any questions, ask your nurse or doctor.          Amantadine HCl 100 MG tablet TAKE 1 TABLET BY MOUTH TWICE DAILY   clonazePAM 0.5 MG tablet Commonly known as: KLONOPIN Take 0.5 mg by mouth at bedtime.   escitalopram 10 MG tablet Commonly known as: LEXAPRO Take 10 mg by mouth daily.   Januvia 100 MG tablet Generic drug: sitaGLIPtin TAKE 1 TABLET BY MOUTH ONCE DAILY.   metFORMIN 1000 MG tablet Commonly known as: GLUCOPHAGE TAKE 1 TABLET BY MOUTH TWICE DAILY   omeprazole 20 MG capsule Commonly known as: PRILOSEC TAKE (1) CAPSULE BY MOUTH ONCE DAILY.   risperiDONE 3 MG tablet Commonly known as: RISPERDAL Take 3 mg by mouth daily.   simethicone 125 MG chewable tablet Commonly known as: MYLICON Chew 125 mg by mouth 3 (three) times daily.   GNP Gas Relief 80 MG chewable tablet Generic drug: simethicone CHEW 1 TABLET BY MOUTH THREE TIMES DAILY BEFORE MEALS AND AT BEDTIME AS NEEDED.   simvastatin 20 MG tablet Commonly  known as: ZOCOR TAKE 1 TABLET BY MOUTH ONCE DAILY.   tamsulosin 0.4 MG Caps capsule Commonly known as: FLOMAX Take 1 capsule (0.4 mg total) by mouth daily. Started by: [name], MD   thioridazine 100 MG tablet Commonly known as: MELLARIL TAKE 1 TABLET BY MOUTH TWICE DAILY   Vitamin D3 1.25 MG (50000 UT) Caps Take 1.25 mg by mouth daily.   Vitamin D3 50 MCG (2000 UT) Tabs TAKE 1 TABLET BY MOUTH ONCE DAILY.        Allergies:  Allergies  Allergen Reactions   Citrullus Vulgaris Hives    Family History: History reviewed. No pertinent family history.  Social History:  reports that he has never smoked. He has never used smokeless tobacco. He reports that he does not drink alcohol and does not use drugs.   Physical Exam: BP 130/84   Pulse 92   Ht 6\' 2"  (1.88 m)   Wt (!) 302 lb (137 kg)   BMI 38.77 kg/m   Constitutional:  Alert, No acute distress. HEENT: [place] AT, moist mucus membranes.  Trachea midline, no masses. Cardiovascular: No clubbing, cyanosis, or edema. Respiratory: Normal respiratory effort, no increased work of breathing. GI: Abdomen is soft, nontender, nondistended, no abdominal masses GU: Prostate 40 g, smooth without nodules Skin: No rashes, bruises or suspicious lesions.   Assessment & Plan:    1.  BPH with LUTS We discussed the most common etiology of lower urinary tract symptoms would be BPH. PVR by bladder scan 21 mL Recommended initial trial tamsulosin 0.4 mg daily, Rx sent to pharmacy Follow-up visit 1 month and if no improvement trial of a beta 3 agonist.   [name], MD  [facility] [address] [place], [place] [address] [phone]

## 2021-06-01 LAB — URINALYSIS, COMPLETE
Bilirubin, UA: NEGATIVE
Glucose, UA: NEGATIVE
Leukocytes,UA: NEGATIVE
Nitrite, UA: NEGATIVE
RBC, UA: NEGATIVE
Specific Gravity, UA: >1.03 — ABNORMAL HIGH (ref 1.005–1.030)
Urobilinogen, Ur: 1 mg/dL (ref 0.2–1.0)
pH, UA: 5.5 (ref 5.0–7.5)

## 2021-06-01 LAB — MICROSCOPIC EXAMINATION
Bacteria, UA: NONE SEEN
RBC, Urine: NONE SEEN /hpf (ref 0–2)

## 2021-06-04 ENCOUNTER — Other Ambulatory Visit: Payer: Self-pay | Admitting: Urology

## 2021-06-24 ENCOUNTER — Other Ambulatory Visit: Payer: Self-pay | Admitting: Urology

## 2021-06-25 ENCOUNTER — Ambulatory Visit (INDEPENDENT_AMBULATORY_CARE_PROVIDER_SITE_OTHER): Payer: Medicare Other | Admitting: Urology

## 2021-06-25 ENCOUNTER — Other Ambulatory Visit: Payer: Self-pay

## 2021-06-25 ENCOUNTER — Encounter: Payer: Self-pay | Admitting: Urology

## 2021-06-25 VITALS — BP 86/60 | HR 61 | Ht 74.0 in | Wt 283.0 lb

## 2021-06-25 DIAGNOSIS — N401 Enlarged prostate with lower urinary tract symptoms: Secondary | ICD-10-CM | POA: Diagnosis not present

## 2021-06-25 DIAGNOSIS — N3941 Urge incontinence: Secondary | ICD-10-CM | POA: Diagnosis not present

## 2021-06-25 MED ORDER — MIRABEGRON ER 50 MG PO TB24: 50.0000 mg | ORAL_TABLET | Freq: Every day | ORAL | 0 refills | Status: DC

## 2021-06-25 NOTE — Progress Notes (Signed)
06/25/2021 8:43 AM   Edwin [name] June 29, 1966 [id number]  Referring provider: No referring provider defined for this encounter.  Chief Complaint  Patient presents with   Urinary Incontinence    HPI: 55 y.o. male presents for 1 month follow-up.  He presents today with his caregiver  Initially seen 05/31/2021 for storage related voiding symptoms PVR 21 mL Trial tamsulosin 0.4 mg daily There has been some improvement in his urinary frequency and urgency on tamsulosin but still having incontinent episodes No dysuria or gross hematuria   PMH: Past Medical History:  Diagnosis Date   Diabetes mellitus without complication (HCC)     Surgical History: No past surgical history on file.  Home Medications:  Allergies as of 06/25/2021       Reactions   Citrullus Vulgaris Hives        Medication List        Accurate as of June 25, 2021  8:43 AM. If you have any questions, ask your nurse or doctor.          Amantadine HCl 100 MG tablet TAKE 1 TABLET BY MOUTH TWICE DAILY   clonazePAM 0.5 MG tablet Commonly known as: KLONOPIN Take 0.5 mg by mouth at bedtime.   escitalopram 10 MG tablet Commonly known as: LEXAPRO Take 10 mg by mouth daily.   Januvia 100 MG tablet Generic drug: sitaGLIPtin TAKE 1 TABLET BY MOUTH ONCE DAILY.   metFORMIN 1000 MG tablet Commonly known as: GLUCOPHAGE TAKE 1 TABLET BY MOUTH TWICE DAILY   mirabegron ER 50 MG Tb24 tablet Commonly known as: MYRBETRIQ Take 1 tablet (50 mg total) by mouth daily. Started by: [name], MD   omeprazole 20 MG capsule Commonly known as: PRILOSEC TAKE (1) CAPSULE BY MOUTH ONCE DAILY.   risperiDONE 3 MG tablet Commonly known as: RISPERDAL Take 3 mg by mouth daily.   simethicone 125 MG chewable tablet Commonly known as: MYLICON Chew 125 mg by mouth 3 (three) times daily.   GNP Gas Relief 80 MG chewable tablet Generic drug: simethicone CHEW 1 TABLET BY MOUTH THREE TIMES DAILY BEFORE MEALS  AND AT BEDTIME AS NEEDED.   simvastatin 20 MG tablet Commonly known as: ZOCOR TAKE 1 TABLET BY MOUTH ONCE DAILY.   tamsulosin 0.4 MG Caps capsule Commonly known as: FLOMAX Take 1 capsule (0.4 mg total) by mouth daily.   thioridazine 100 MG tablet Commonly known as: MELLARIL TAKE 1 TABLET BY MOUTH TWICE DAILY   Vitamin D3 1.25 MG (50000 UT) Caps Take 1.25 mg by mouth daily.   Vitamin D3 50 MCG (2000 UT) Tabs TAKE 1 TABLET BY MOUTH ONCE DAILY.        Allergies:  Allergies  Allergen Reactions   Citrullus Vulgaris Hives    Family History: No family history on file.  Social History:  reports that he has never smoked. He has never used smokeless tobacco. He reports that he does not drink alcohol and does not use drugs.   Physical Exam: BP (!) 86/60   Pulse 61   Ht 6\' 2"  (1.88 m)   Wt 283 lb (128.4 kg)   BMI 36.34 kg/m   Constitutional:  Alert, No acute distress. HEENT: [place] AT, moist mucus membranes.  Trachea midline, no masses. Cardiovascular: No clubbing, cyanosis, or edema. Respiratory: Normal respiratory effort, no increased work of breathing.   Assessment & Plan:    1.  BPH with LUTS Some improvement on tamsulosin and will continue  2.  Urge incontinence  Trial Myrbetriq 50 mg daily-samples given and will call back regarding efficacy   Follow-up office visit 6 months    [name], MD  [facility] [address], [address] [place], [place] [address] [phone]

## 2021-06-29 ENCOUNTER — Other Ambulatory Visit: Payer: Self-pay | Admitting: Urology

## 2021-06-30 ENCOUNTER — Other Ambulatory Visit: Payer: Self-pay | Admitting: Urology

## 2021-07-05 ENCOUNTER — Other Ambulatory Visit: Payer: Self-pay | Admitting: Family Medicine

## 2021-07-05 MED ORDER — MIRABEGRON ER 50 MG PO TB24: 50.0000 mg | ORAL_TABLET | Freq: Every day | ORAL | 1 refills | Status: DC

## 2021-07-05 MED ORDER — TAMSULOSIN HCL 0.4 MG PO CAPS: 0.4000 mg | ORAL_CAPSULE | Freq: Every day | ORAL | 1 refills | Status: DC

## 2021-07-23 ENCOUNTER — Ambulatory Visit: Payer: Medicare Other | Admitting: Family Medicine

## 2021-07-30 ENCOUNTER — Other Ambulatory Visit: Payer: Self-pay | Admitting: Urology

## 2021-08-03 ENCOUNTER — Encounter: Payer: Self-pay | Admitting: Internal Medicine

## 2021-08-03 ENCOUNTER — Other Ambulatory Visit: Payer: Self-pay

## 2021-08-03 ENCOUNTER — Ambulatory Visit (INDEPENDENT_AMBULATORY_CARE_PROVIDER_SITE_OTHER): Payer: Medicare Other | Admitting: Internal Medicine

## 2021-08-03 VITALS — BP 140/80 | HR 84 | Ht 74.0 in | Wt 303.8 lb

## 2021-08-03 DIAGNOSIS — E119 Type 2 diabetes mellitus without complications: Secondary | ICD-10-CM

## 2021-08-03 DIAGNOSIS — E782 Mixed hyperlipidemia: Secondary | ICD-10-CM | POA: Diagnosis not present

## 2021-08-03 DIAGNOSIS — E1122 Type 2 diabetes mellitus with diabetic chronic kidney disease: Secondary | ICD-10-CM | POA: Diagnosis not present

## 2021-08-03 DIAGNOSIS — E669 Obesity, unspecified: Secondary | ICD-10-CM

## 2021-08-03 DIAGNOSIS — N1831 Chronic kidney disease, stage 3a: Secondary | ICD-10-CM

## 2021-08-03 LAB — GLUCOSE, POCT (MANUAL RESULT ENTRY): POC Glucose: 112 mg/dl — AB (ref 70–99)

## 2021-08-03 LAB — POCT GLYCOSYLATED HEMOGLOBIN (HGB A1C): HbA1c POC (<> result, manual entry): 4.8 % (ref 4.0–5.6)

## 2021-08-03 NOTE — Assessment & Plan Note (Signed)

## 2021-08-03 NOTE — Assessment & Plan Note (Signed)

## 2021-08-03 NOTE — Assessment & Plan Note (Signed)
Hypercholesterolemia  I advised the patient to follow Mediterranean diet This diet is rich in fruits vegetables and whole grain, and This diet is also rich in fish and lean meat Patient should also eat a handful of almonds or walnuts daily Recent heart study indicated that average follow-up on this kind of diet reduces the cardiovascular mortality by 50 to 70%== 

## 2021-08-03 NOTE — Progress Notes (Signed)
Established Patient Office Visit  Subjective:  Patient ID: Edwin [name], male    DOB: [birth year]  Age: 55 y.o. MRN: [id number]  CC:  Chief Complaint  Patient presents with  . Diabetes    Diabetes He presents for his follow-up diabetic visit. He has type 2 diabetes mellitus. No MedicAlert identification noted. The initial diagnosis of diabetes was made 4 years ago. His disease course has been improving. Pertinent negatives for hypoglycemia include no confusion, dizziness or mood changes. Pertinent negatives for diabetes include no blurred vision (4), no chest pain, no fatigue, no polydipsia and no weakness. Pertinent negatives for hypoglycemia complications include no blackouts.   Edwin [name] presents for general check  Past Medical History:  Diagnosis Date  . Diabetes mellitus without complication (HCC)     History reviewed. No pertinent surgical history.  History reviewed. No pertinent family history.  Social History   Socioeconomic History  . Marital status: Divorced    Spouse name: Not on file  . Number of children: Not on file  . Years of education: Not on file  . Highest education level: Not on file  Occupational History  . Not on file  Tobacco Use  . Smoking status: Never  . Smokeless tobacco: Never  Substance and Sexual Activity  . Alcohol use: No  . Drug use: No  . Sexual activity: Not on file  Other Topics Concern  . Not on file  Social History Narrative  . Not on file   Social Determinants of Health   Financial Resource Strain: Not on file  Food Insecurity: Not on file  Transportation Needs: Not on file  Physical Activity: Not on file  Stress: Not on file  Social Connections: Not on file  Intimate Partner Violence: Not on file     Current Outpatient Medications:  .  Amantadine HCl 100 MG tablet, TAKE 1 TABLET BY MOUTH TWICE DAILY, Disp: 60 tablet, Rfl: 11 .  Cholecalciferol (VITAMIN D3) 1.25 MG (50000 UT) CAPS, Take 1.25 mg by mouth  daily., Disp: , Rfl:  .  Cholecalciferol (VITAMIN D3) 50 MCG (2000 UT) TABS, TAKE 1 TABLET BY MOUTH ONCE DAILY., Disp: 30 tablet, Rfl: 11 .  clonazePAM (KLONOPIN) 0.5 MG tablet, Take 0.5 mg by mouth at bedtime., Disp: , Rfl:  .  escitalopram (LEXAPRO) 10 MG tablet, Take 10 mg by mouth daily., Disp: , Rfl:  .  GNP GAS RELIEF 80 MG chewable tablet, CHEW 1 TABLET BY MOUTH THREE TIMES DAILY BEFORE MEALS AND AT BEDTIME AS NEEDED., Disp: 30 tablet, Rfl: 0 .  JANUVIA 100 MG tablet, TAKE 1 TABLET BY MOUTH ONCE DAILY., Disp: 30 tablet, Rfl: 11 .  metFORMIN (GLUCOPHAGE) 1000 MG tablet, TAKE 1 TABLET BY MOUTH TWICE DAILY, Disp: 60 tablet, Rfl: 11 .  MYRBETRIQ 50 MG TB24 tablet, TAKE 1 TABLET BY MOUTH ONCE DAILY., Disp: 30 tablet, Rfl: 0 .  omeprazole (PRILOSEC) 20 MG capsule, TAKE (1) CAPSULE BY MOUTH ONCE DAILY., Disp: 30 capsule, Rfl: 11 .  risperiDONE (RISPERDAL) 3 MG tablet, Take 3 mg by mouth daily., Disp: , Rfl:  .  simethicone (MYLICON) 125 MG chewable tablet, Chew 125 mg by mouth 3 (three) times daily., Disp: , Rfl:  .  simvastatin (ZOCOR) 20 MG tablet, TAKE 1 TABLET BY MOUTH ONCE DAILY., Disp: 30 tablet, Rfl: 11 .  tamsulosin (FLOMAX) 0.4 MG CAPS capsule, Take 1 capsule (0.4 mg total) by mouth daily., Disp: 30 capsule, Rfl: 1 .  thioridazine (MELLARIL) 100  MG tablet, TAKE 1 TABLET BY MOUTH TWICE DAILY, Disp: 60 tablet, Rfl: 11   Allergies  Allergen Reactions  . Citrullus Vulgaris Hives    ROS Review of Systems  Constitutional: Negative.  Negative for fatigue.  HENT: Negative.    Eyes: Negative.  Negative for blurred vision (4).  Respiratory: Negative.    Cardiovascular: Negative.  Negative for chest pain.  Gastrointestinal: Negative.   Endocrine: Negative.  Negative for polydipsia.  Genitourinary: Negative.   Musculoskeletal: Negative.   Skin: Negative.   Allergic/Immunologic: Negative.   Neurological: Negative.  Negative for dizziness and weakness.  Hematological: Negative.    Psychiatric/Behavioral: Negative.  Negative for confusion.   All other systems reviewed and are negative.    Objective:    Physical Exam Vitals reviewed.  Constitutional:      Appearance: Normal appearance.  HENT:     Mouth/Throat:     Mouth: Mucous membranes are moist.  Eyes:     Pupils: Pupils are equal, round, and reactive to light.  Neck:     Vascular: No carotid bruit.  Cardiovascular:     Rate and Rhythm: Normal rate and regular rhythm.     Pulses: Normal pulses.     Heart sounds: Normal heart sounds.  Pulmonary:     Effort: Pulmonary effort is normal.     Breath sounds: Normal breath sounds.  Abdominal:     General: Bowel sounds are normal.     Palpations: Abdomen is soft. There is no hepatomegaly, splenomegaly or mass.     Tenderness: There is no abdominal tenderness.     Hernia: No hernia is present.  Musculoskeletal:     Cervical back: Neck supple.     Right lower leg: No edema.     Left lower leg: No edema.  Skin:    Findings: No rash.  Neurological:     Mental Status: He is alert and oriented to person, place, and time.     Motor: No weakness.  Psychiatric:        Mood and Affect: Mood normal.        Behavior: Behavior normal.    BP 140/80   Pulse 84   Ht 6\' 2"  (1.88 m)   Wt (!) 303 lb 12.8 oz (137.8 kg)   BMI 39.01 kg/m  Wt Readings from Last 3 Encounters:  08/03/21 (!) 303 lb 12.8 oz (137.8 kg)  06/25/21 283 lb (128.4 kg)  05/31/21 (!) 302 lb (137 kg)     Health Maintenance Due  Topic Date Due  . PNEUMOCOCCAL POLYSACCHARIDE VACCINE AGE 55-64 HIGH RISK  Never done  . COVID-19 Vaccine (1) Never done  . FOOT EXAM  Never done  . OPHTHALMOLOGY EXAM  Never done  . URINE MICROALBUMIN  Never done  . HIV Screening  Never done  . Hepatitis C Screening  Never done  . COLONOSCOPY (Pts 45-85yrs Insurance coverage will need to be confirmed)  Never done  . Zoster Vaccines- Shingrix (1 of 2) Never done  . INFLUENZA VACCINE  07/05/2021    There are  no preventive care reminders to display for this patient.  Lab Results  Component Value Date   TSH 1.40 05/13/2020   Lab Results  Component Value Date   WBC 4.8 05/13/2020   HGB 14.3 05/13/2020   HCT 44.1 05/13/2020   MCV 84.0 05/13/2020   PLT 189 05/13/2020   Lab Results  Component Value Date   NA 143 11/20/2020   K 4.3 11/20/2020  CO2 21 11/20/2020   GLUCOSE 80 11/20/2020   BUN 15 11/20/2020   CREATININE 1.58 (H) 11/20/2020   BILITOT 0.4 11/20/2020   ALKPHOS 73 07/11/2017   AST 11 11/20/2020   ALT 10 11/20/2020   PROT 6.4 11/20/2020   ALBUMIN 3.8 07/11/2017   CALCIUM 9.1 11/20/2020   ANIONGAP 10 07/11/2017   No results found for: CHOL No results found for: HDL No results found for: LDLCALC No results found for: TRIG No results found for: CHOLHDL Lab Results  Component Value Date   HGBA1C 4.8 08/03/2021      Assessment & Plan:   Problem List Items Addressed This Visit       Endocrine   Type 2 diabetes mellitus with stage 3a chronic kidney disease, without long-term current use of insulin (HCC)    - The patient's blood sugar is labile on med. - The patient will continue the current treatment regimen.  - I encouraged the patient to regularly check blood sugar.  - I encouraged the patient to monitor diet. I encouraged the patient to eat low-carb and low-sugar to help prevent blood sugar spikes.  - I encouraged the patient to continue following their prescribed treatment plan for diabetes - I informed the patient to get help if blood sugar drops below 54mg /dL, or if suddenly have trouble thinking clearly or breathing.  Patient was advised to buy a book on diabetes from a local bookstore or from .  Patient should read 2 chapters every day to keep the motivation going, this is in addition to some of the materials we provided them from the office.  There are other resources on the Internet like YouTube and wilkipedia to get an education on the diabetes         Other   Obesity (BMI 35.0-39.9 without comorbidity)    - I encouraged the patient to lose weight.  - I educated them on making healthy dietary choices including eating more fruits and vegetables and less fried foods. - I encouraged the patient to exercise more, and educated on the benefits of exercise including weight loss, diabetes prevention, and hypertension prevention.   Dietary counseling with a registered dietician  Referral to a weight management support group (e.g. Weight Watchers, Overeaters Anonymous)  If your BMI is greater than 29 or you have gained more than 15 pounds you should work on weight loss.  Attend a healthy cooking class       Moderate mixed hyperlipidemia not requiring statin therapy    Hypercholesterolemia  I advised the patient to follow Mediterranean diet This diet is rich in fruits vegetables and whole grain, and This diet is also rich in fish and lean meat Patient should also eat a handful of almonds or walnuts daily Recent heart study indicated that average follow-up on this kind of diet reduces the cardiovascular mortality by 50 to 70%==      Other Visit Diagnoses     Type 2 diabetes mellitus without complication, without long-term current use of insulin (HCC)    -  Primary   Relevant Orders   POCT HgB A1C (Completed)   POCT glucose (manual entry) (Completed)       No orders of the defined types were placed in this encounter.   Follow-up: No follow-ups on file.    11-04-1990, MD

## 2021-08-24 ENCOUNTER — Ambulatory Visit (INDEPENDENT_AMBULATORY_CARE_PROVIDER_SITE_OTHER): Payer: Medicare Other | Admitting: Internal Medicine

## 2021-08-24 ENCOUNTER — Encounter: Payer: Self-pay | Admitting: Internal Medicine

## 2021-08-24 ENCOUNTER — Other Ambulatory Visit: Payer: Self-pay

## 2021-08-24 VITALS — BP 108/73 | HR 85 | Ht 74.0 in | Wt 302.1 lb

## 2021-08-24 DIAGNOSIS — G4730 Sleep apnea, unspecified: Secondary | ICD-10-CM

## 2021-08-24 DIAGNOSIS — N1832 Chronic kidney disease, stage 3b: Secondary | ICD-10-CM | POA: Diagnosis not present

## 2021-08-24 DIAGNOSIS — E669 Obesity, unspecified: Secondary | ICD-10-CM

## 2021-08-24 DIAGNOSIS — E782 Mixed hyperlipidemia: Secondary | ICD-10-CM

## 2021-08-24 DIAGNOSIS — E1122 Type 2 diabetes mellitus with diabetic chronic kidney disease: Secondary | ICD-10-CM | POA: Diagnosis not present

## 2021-08-24 DIAGNOSIS — N1831 Chronic kidney disease, stage 3a: Secondary | ICD-10-CM

## 2021-08-24 NOTE — Assessment & Plan Note (Signed)

## 2021-08-24 NOTE — Progress Notes (Signed)
Established Patient Office Visit  Subjective:  Patient ID: Edwin [name], male    DOB: 1966-05-03  Age: 55 y.o. MRN: [id number]  CC:  Chief Complaint  Patient presents with   Follow-up    Follow up from 8/30 visit. Care taker wanted to have patients BP taken again since the numbers were higher than normal at last visit.     HPI  Edwin [name] presents forcheck up  Past Medical History:  Diagnosis Date   Diabetes mellitus without complication (HCC)     History reviewed. No pertinent surgical history.  History reviewed. No pertinent family history.  Social History   Socioeconomic History   Marital status: Divorced    Spouse name: Not on file   Number of children: Not on file   Years of education: Not on file   Highest education level: Not on file  Occupational History   Not on file  Tobacco Use   Smoking status: Never   Smokeless tobacco: Never  Substance and Sexual Activity   Alcohol use: No   Drug use: No   Sexual activity: Not on file  Other Topics Concern   Not on file  Social History Narrative   Not on file   Social Determinants of Health   Financial Resource Strain: Not on file  Food Insecurity: Not on file  Transportation Needs: Not on file  Physical Activity: Not on file  Stress: Not on file  Social Connections: Not on file  Intimate Partner Violence: Not on file     Current Outpatient Medications:    Amantadine HCl 100 MG tablet, TAKE 1 TABLET BY MOUTH TWICE DAILY, Disp: 60 tablet, Rfl: 11   Cholecalciferol (VITAMIN D3) 1.25 MG (50000 UT) CAPS, Take 1.25 mg by mouth daily., Disp: , Rfl:    Cholecalciferol (VITAMIN D3) 50 MCG (2000 UT) TABS, TAKE 1 TABLET BY MOUTH ONCE DAILY., Disp: 30 tablet, Rfl: 11   clonazePAM (KLONOPIN) 0.5 MG tablet, Take 0.5 mg by mouth at bedtime., Disp: , Rfl:    escitalopram (LEXAPRO) 10 MG tablet, Take 10 mg by mouth daily., Disp: , Rfl:    GNP GAS RELIEF 80 MG chewable tablet, CHEW 1 TABLET BY MOUTH THREE TIMES  DAILY BEFORE MEALS AND AT BEDTIME AS NEEDED., Disp: 30 tablet, Rfl: 0   JANUVIA 100 MG tablet, TAKE 1 TABLET BY MOUTH ONCE DAILY., Disp: 30 tablet, Rfl: 11   metFORMIN (GLUCOPHAGE) 1000 MG tablet, TAKE 1 TABLET BY MOUTH TWICE DAILY, Disp: 60 tablet, Rfl: 11   MYRBETRIQ 50 MG TB24 tablet, TAKE 1 TABLET BY MOUTH ONCE DAILY., Disp: 30 tablet, Rfl: 0   omeprazole (PRILOSEC) 20 MG capsule, TAKE (1) CAPSULE BY MOUTH ONCE DAILY., Disp: 30 capsule, Rfl: 11   risperiDONE (RISPERDAL) 3 MG tablet, Take 3 mg by mouth daily., Disp: , Rfl:    simethicone (MYLICON) 125 MG chewable tablet, Chew 125 mg by mouth 3 (three) times daily., Disp: , Rfl:    simvastatin (ZOCOR) 20 MG tablet, TAKE 1 TABLET BY MOUTH ONCE DAILY., Disp: 30 tablet, Rfl: 11   tamsulosin (FLOMAX) 0.4 MG CAPS capsule, Take 1 capsule (0.4 mg total) by mouth daily., Disp: 30 capsule, Rfl: 1   thioridazine (MELLARIL) 100 MG tablet, TAKE 1 TABLET BY MOUTH TWICE DAILY, Disp: 60 tablet, Rfl: 11   Allergies  Allergen Reactions   Citrullus Vulgaris Hives    ROS Review of Systems  Constitutional: Negative.   HENT: Negative.    Eyes: Negative.  Respiratory: Negative.    Cardiovascular: Negative.   Gastrointestinal: Negative.   Endocrine: Negative.   Genitourinary: Negative.   Musculoskeletal: Negative.   Skin: Negative.   Allergic/Immunologic: Negative.   Neurological: Negative.   Hematological: Negative.   Psychiatric/Behavioral: Negative.    All other systems reviewed and are negative.    Objective:    Physical Exam Vitals reviewed.  Constitutional:      Appearance: Normal appearance. He is obese. He is not toxic-appearing.  HENT:     Head: Normocephalic and atraumatic.     Right Ear: Tympanic membrane normal.     Left Ear: Tympanic membrane normal.     Mouth/Throat:     Mouth: Mucous membranes are moist.  Eyes:     Pupils: Pupils are equal, round, and reactive to light.  Neck:     Vascular: No carotid bruit.   Cardiovascular:     Rate and Rhythm: Normal rate and regular rhythm.     Pulses: Normal pulses.     Heart sounds: Normal heart sounds.  Pulmonary:     Effort: Pulmonary effort is normal.     Breath sounds: Normal breath sounds.  Abdominal:     General: Bowel sounds are normal.     Palpations: Abdomen is soft. There is no hepatomegaly, splenomegaly or mass.     Tenderness: There is no abdominal tenderness.     Hernia: No hernia is present.  Musculoskeletal:     Cervical back: Normal range of motion and neck supple.     Right lower leg: No edema.     Left lower leg: No edema.  Skin:    Findings: No rash.  Neurological:     Mental Status: He is alert and oriented to person, place, and time.     Motor: No weakness.  Psychiatric:        Mood and Affect: Mood normal.        Behavior: Behavior normal.    BP 108/73   Pulse 85   Ht 6\' 2"  (1.88 m)   Wt (!) 302 lb 1.6 oz (137 kg)   BMI 38.79 kg/m  Wt Readings from Last 3 Encounters:  08/24/21 (!) 302 lb 1.6 oz (137 kg)  08/03/21 (!) 303 lb 12.8 oz (137.8 kg)  06/25/21 283 lb (128.4 kg)     Health Maintenance Due  Topic Date Due   COVID-19 Vaccine (1) Never done   FOOT EXAM  Never done   OPHTHALMOLOGY EXAM  Never done   URINE MICROALBUMIN  Never done   HIV Screening  Never done   Hepatitis C Screening  Never done   COLONOSCOPY (Pts 45-18yrs Insurance coverage will need to be confirmed)  Never done   Zoster Vaccines- Shingrix (1 of 2) Never done   INFLUENZA VACCINE  Never done    There are no preventive care reminders to display for this patient.  Lab Results  Component Value Date   TSH 1.40 05/13/2020   Lab Results  Component Value Date   WBC 4.8 05/13/2020   HGB 14.3 05/13/2020   HCT 44.1 05/13/2020   MCV 84.0 05/13/2020   PLT 189 05/13/2020   Lab Results  Component Value Date   NA 143 11/20/2020   K 4.3 11/20/2020   CO2 21 11/20/2020   GLUCOSE 80 11/20/2020   BUN 15 11/20/2020   CREATININE 1.58 (H)  11/20/2020   BILITOT 0.4 11/20/2020   ALKPHOS 73 07/11/2017   AST 11 11/20/2020   ALT 10  11/20/2020   PROT 6.4 11/20/2020   ALBUMIN 3.8 07/11/2017   CALCIUM 9.1 11/20/2020   ANIONGAP 10 07/11/2017   No results found for: CHOL No results found for: HDL No results found for: LDLCALC No results found for: TRIG No results found for: CHOLHDL Lab Results  Component Value Date   HGBA1C 4.8 08/03/2021      Assessment & Plan:   Problem List Items Addressed This Visit       Respiratory   Sleep apnea in adult - Primary    Patient is using CPAP machine        Endocrine   Type 2 diabetes mellitus with stage 3a chronic kidney disease, without long-term current use of insulin (HCC)    - The patient's blood sugar is labile on med. - The patient will continue the current treatment regimen.  - I encouraged the patient to regularly check blood sugar.  - I encouraged the patient to monitor diet. I encouraged the patient to eat low-carb and low-sugar to help prevent blood sugar spikes.  - I encouraged the patient to continue following their prescribed treatment plan for diabetes - I informed the patient to get help if blood sugar drops below 54mg /dL, or if suddenly have trouble thinking clearly or breathing.  Patient was advised to buy a book on diabetes from a local bookstore or from .  Patient should read 2 chapters every day to keep the motivation going, this is in addition to some of the materials we provided them from the office.  There are other resources on the Internet like YouTube and wilkipedia to get an education on the diabetes        Genitourinary   Stage 3b chronic kidney disease (HCC)    Stable at the present time we will repeat the blood test        Other   Obesity (BMI 35.0-39.9 without comorbidity)    Behavioral modification strategies: increasing lean protein intake, decreasing simple carbohydrates, increasing vegetables, increasing water intake, decreasing  eating out, no skipping meals, meal planning and cooking strategies, keeping healthy foods in the home and planning for success.      Moderate mixed hyperlipidemia not requiring statin therapy    Hypercholesterolemia  I advised the patient to follow Mediterranean diet This diet is rich in fruits vegetables and whole grain, and This diet is also rich in fish and lean meat Patient should also eat a handful of almonds or walnuts daily Recent heart study indicated that average follow-up on this kind of diet reduces the cardiovascular mortality by 50 to 70%==       No orders of the defined types were placed in this encounter.   Follow-up: No follow-ups on file.    11-04-1990, MD

## 2021-08-24 NOTE — Assessment & Plan Note (Signed)
Stable at the present time we will repeat the blood test

## 2021-08-24 NOTE — Assessment & Plan Note (Signed)
Behavioral modification strategies: increasing lean protein intake, decreasing simple carbohydrates, increasing vegetables, increasing water intake, decreasing eating out, no skipping meals, meal planning and cooking strategies, keeping healthy foods in the home and planning for success. 

## 2021-08-24 NOTE — Assessment & Plan Note (Signed)
Hypercholesterolemia  I advised the patient to follow Mediterranean diet This diet is rich in fruits vegetables and whole grain, and This diet is also rich in fish and lean meat Patient should also eat a handful of almonds or walnuts daily Recent heart study indicated that average follow-up on this kind of diet reduces the cardiovascular mortality by 50 to 70%== 

## 2021-08-24 NOTE — Assessment & Plan Note (Signed)
Patient is using CPAP machine 

## 2021-08-30 ENCOUNTER — Other Ambulatory Visit: Payer: Self-pay | Admitting: Urology

## 2021-09-06 ENCOUNTER — Other Ambulatory Visit: Payer: Self-pay | Admitting: *Deleted

## 2021-09-06 MED ORDER — DOCUSATE SODIUM 250 MG PO CAPS: 250.0000 mg | ORAL_CAPSULE | Freq: Every day | ORAL | 1 refills | Status: DC

## 2021-09-15 ENCOUNTER — Other Ambulatory Visit: Payer: Self-pay | Admitting: Internal Medicine

## 2021-09-28 ENCOUNTER — Other Ambulatory Visit: Payer: Self-pay | Admitting: Urology

## 2021-10-04 ENCOUNTER — Other Ambulatory Visit: Payer: Self-pay | Admitting: Urology

## 2021-10-04 DIAGNOSIS — R350 Frequency of micturition: Secondary | ICD-10-CM

## 2021-10-04 MED ORDER — MIRABEGRON ER 50 MG PO TB24: 50.0000 mg | ORAL_TABLET | Freq: Every day | ORAL | 8 refills | Status: DC

## 2021-10-04 NOTE — Telephone Encounter (Signed)
Incoming call from caregiver at home stating that they have been trying to get a refill on Myrbetriq. Pt has been seen within the year, rx sent to last until next scheduled follow up.

## 2021-10-11 ENCOUNTER — Other Ambulatory Visit: Payer: Self-pay | Admitting: Internal Medicine

## 2021-11-03 ENCOUNTER — Other Ambulatory Visit: Payer: Self-pay | Admitting: Internal Medicine

## 2021-11-22 ENCOUNTER — Other Ambulatory Visit: Payer: Self-pay | Admitting: Internal Medicine

## 2021-11-24 ENCOUNTER — Encounter: Payer: Medicare Other | Admitting: Internal Medicine

## 2021-12-01 ENCOUNTER — Ambulatory Visit (INDEPENDENT_AMBULATORY_CARE_PROVIDER_SITE_OTHER): Payer: Medicare Other

## 2021-12-01 DIAGNOSIS — Z0000 Encounter for general adult medical examination without abnormal findings: Secondary | ICD-10-CM

## 2021-12-01 DIAGNOSIS — Z1211 Encounter for screening for malignant neoplasm of colon: Secondary | ICD-10-CM

## 2021-12-01 NOTE — Progress Notes (Signed)
I have reviewed this visit and agree with the documentation.   

## 2021-12-01 NOTE — Progress Notes (Signed)
Subjective:   Edwin [name] is a 55 y.o. male who presents for Medicare Annual/Subsequent preventive examination. I discussed the limitations of evaluation and management by telemedicine and the availability of in person appointments. The patient expressed understanding and agreed to proceed.   Visit performed by audio   Patient location: Home  Provider location: Home  Review of Systems    N/A Cardiac Risk Factors include: none     Objective:    There were no vitals filed for this visit. There is no height or weight on file to calculate BMI.  Advanced Directives 12/01/2021 07/11/2017  Does Patient Have a Medical Advance Directive? Unable to assess, patient is non-responsive or altered mental status Yes  Type of Advance Directive - Healthcare Power of Attorney    Current Medications (verified) Outpatient Encounter Medications as of 12/01/2021  Medication Sig   Amantadine HCl 100 MG tablet TAKE 1 TABLET BY MOUTH TWICE DAILY   Cholecalciferol (VITAMIN D3) 1.25 MG (50000 UT) CAPS Take 1.25 mg by mouth daily.   Cholecalciferol (VITAMIN D3) 50 MCG (2000 UT) TABS TAKE 1 TABLET BY MOUTH ONCE DAILY.   clonazePAM (KLONOPIN) 0.5 MG tablet Take 0.5 mg by mouth at bedtime.   docusate sodium (COLACE) 250 MG capsule TAKE (1) CAPSULE BY MOUTH ONCE DAILY.   escitalopram (LEXAPRO) 10 MG tablet Take 10 mg by mouth daily.   GNP GAS RELIEF 80 MG chewable tablet CHEW 1 TABLET BY MOUTH THREE TIMES DAILY BEFORE MEALS AND AT BEDTIME AS NEEDED.   JANUVIA 100 MG tablet TAKE 1 TABLET BY MOUTH ONCE DAILY.   metFORMIN (GLUCOPHAGE) 1000 MG tablet TAKE 1 TABLET BY MOUTH TWICE DAILY   mirabegron ER (MYRBETRIQ) 50 MG TB24 tablet Take 1 tablet (50 mg total) by mouth daily.   omeprazole (PRILOSEC) 20 MG capsule TAKE (1) CAPSULE BY MOUTH ONCE DAILY.   risperiDONE (RISPERDAL) 3 MG tablet Take 3 mg by mouth daily.   simethicone (MYLICON) 125 MG chewable tablet Chew 125 mg by mouth 3 (three) times daily.    simvastatin (ZOCOR) 20 MG tablet TAKE 1 TABLET BY MOUTH ONCE DAILY.   tamsulosin (FLOMAX) 0.4 MG CAPS capsule TAKE (1) CAPSULE BY MOUTH ONCE DAILY.   thioridazine (MELLARIL) 100 MG tablet TAKE 1 TABLET BY MOUTH TWICE DAILY   No facility-administered encounter medications on file as of 12/01/2021.    Allergies (verified) Citrullus vulgaris   History: Past Medical History:  Diagnosis Date   Diabetes mellitus without complication (HCC)    History reviewed. No pertinent surgical history. History reviewed. No pertinent family history. Social History   Socioeconomic History   Marital status: Single    Spouse name: Not on file   Number of children: 0   Years of education: Not on file   Highest education level: Not on file  Occupational History   Occupation: Disabled  Tobacco Use   Smoking status: Never   Smokeless tobacco: Never  Vaping Use   Vaping Use: Never used  Substance and Sexual Activity   Alcohol use: No   Drug use: No   Sexual activity: Not Currently  Other Topics Concern   Not on file  Social History Narrative   Not on file   Social Determinants of Health   Financial Resource Strain: Low Risk    Difficulty of Paying Living Expenses: Not hard at all  Food Insecurity: No Food Insecurity   Worried About Running Out of Food in the Last Year: Never true   Ran  Out of Food in the Last Year: Never true  Transportation Needs: No Transportation Needs   Lack of Transportation (Medical): No   Lack of Transportation (Non-Medical): No  Physical Activity: Insufficiently Active   Days of Exercise per Week: 3 days   Minutes of Exercise per Session: 10 min  Stress: No Stress Concern Present   Feeling of Stress : Not at all  Social Connections: Moderately Integrated   Frequency of Communication with Friends and Family: More than three times a week   Frequency of Social Gatherings with Friends and Family: Once a week   Attends Religious Services: 1 to 4 times per year    Active Member of [employer] or Organizations: Yes   Attends Engineer, structural: More than 4 times per year   Marital Status: Never married    Tobacco Counseling Counseling given: Not Answered   Clinical Intake:  Pre-visit preparation completed: Yes  Pain : No/denies pain     Diabetes: No  How often do you need to have someone help you when you read instructions, pamphlets, or other written materials from your doctor or pharmacy?: 5 - Always  Diabetic? Yes  Interpreter Needed?: No  Information entered by :: Garnet Sierras CMA   Activities of Daily Living In your present state of health, do you have any difficulty performing the following activities: 12/01/2021  Hearing? N  Vision? N  Difficulty concentrating or making decisions? Y  Walking or climbing stairs? N  Dressing or bathing? N  Doing errands, shopping? Y  Preparing Food and eating ? N  Using the Toilet? N  In the past six months, have you accidently leaked urine? Y  Do you have problems with loss of bowel control? Y  Managing your Medications? Y  Managing your Finances? Y  Housekeeping or managing your Housekeeping? Y  Some recent data might be hidden    Patient Care Team: [name], MD as PCP - General (Internal Medicine)  Indicate any recent Medical Services you may have received from other than [facility] providers in the past year (date may be approximate).     Assessment:   This is a routine wellness examination for [place].  Hearing/Vision screen No results found.  Dietary issues and exercise activities discussed: Current Exercise Habits: Home exercise routine, Type of exercise: walking, Time (Minutes): 10, Frequency (Times/Week): 3, Weekly Exercise (Minutes/Week): 30, Intensity: Mild   Goals Addressed   None    Depression Screen PHQ 2/9 Scores 12/01/2021 04/16/2021  PHQ - 2 Score 0 0    Fall Risk Fall Risk  12/01/2021 08/24/2021 08/03/2021 04/16/2021  Falls in the past year? 0 0 0  0  Number falls in past yr: 0 0 0 0  Injury with Fall? 0 0 0 0  Risk for fall due to : No Fall Risks No Fall Risks No Fall Risks No Fall Risks  Follow up Falls evaluation completed Falls evaluation completed Falls evaluation completed -    FALL RISK PREVENTION PERTAINING TO THE HOME:  Any stairs in or around the home? Yes  If so, are there any without handrails? No  Home free of loose throw rugs in walkways, pet beds, electrical cords, etc? Yes  Adequate lighting in your home to reduce risk of falls? Yes   ASSISTIVE DEVICES UTILIZED TO PREVENT FALLS:  Life alert? No  Use of a cane, walker or w/c? No  Grab bars in the bathroom? Yes  Shower chair or bench in shower? No  Elevated  toilet seat or a handicapped toilet? Yes   TIMED UP AND GO:  Was the test performed? No .  Length of time to ambulate 10 feet: 0 sec.     Cognitive Function:        Immunizations Immunization History  Administered Date(s) Administered   Moderna SARS-COV2 Booster Vaccination 03/18/2021, 09/14/2021   Moderna Sars-Covid-2 Vaccination 10/01/2020   PFIZER(Purple Top)SARS-COV-2 Vaccination 01/03/2020   Tdap 11/20/2020    TDAP status: Up to date  Flu Vaccine status: Due, Education has been provided regarding the importance of this vaccine. Advised may receive this vaccine at local pharmacy or Health Dept. Aware to provide a copy of the vaccination record if obtained from local pharmacy or Health Dept. Verbalized acceptance and understanding.  Pneumococcal vaccine status: Due, Education has been provided regarding the importance of this vaccine. Advised may receive this vaccine at local pharmacy or Health Dept. Aware to provide a copy of the vaccination record if obtained from local pharmacy or Health Dept. Verbalized acceptance and understanding.  Covid-19 vaccine status: Information provided on how to obtain vaccines.   Qualifies for Shingles Vaccine? Yes   Zostavax completed No   Shingrix  Completed?: No.    Education has been provided regarding the importance of this vaccine. Patient has been advised to call insurance company to determine out of pocket expense if they have not yet received this vaccine. Advised may also receive vaccine at local pharmacy or Health Dept. Verbalized acceptance and understanding.  Screening Tests Health Maintenance  Topic Date Due   Pneumococcal Vaccine 2-84 Years old (1 - PCV) Never done   FOOT EXAM  Never done   OPHTHALMOLOGY EXAM  Never done   URINE MICROALBUMIN  Never done   HIV Screening  Never done   Hepatitis C Screening  Never done   COLONOSCOPY (Pts 45-39yrs Insurance coverage will need to be confirmed)  Never done   Zoster Vaccines- Shingrix (1 of 2) Never done   INFLUENZA VACCINE  Never done   COVID-19 Vaccine (3 - Booster) 11/09/2021   HEMOGLOBIN A1C  02/01/2022   TETANUS/TDAP  11/20/2030   HPV VACCINES  Aged Out    Health Maintenance  Health Maintenance Due  Topic Date Due   Pneumococcal Vaccine 92-46 Years old (1 - PCV) Never done   FOOT EXAM  Never done   OPHTHALMOLOGY EXAM  Never done   URINE MICROALBUMIN  Never done   HIV Screening  Never done   Hepatitis C Screening  Never done   COLONOSCOPY (Pts 45-72yrs Insurance coverage will need to be confirmed)  Never done   Zoster Vaccines- Shingrix (1 of 2) Never done   INFLUENZA VACCINE  Never done   COVID-19 Vaccine (3 - Booster) 11/09/2021    Colorectal cancer screening: Type of screening: Colonoscopy. Completed N0. Repeat every 10 years  Lung Cancer Screening: (Low Dose CT Chest recommended if Age 42-80 years, 30 pack-year currently smoking OR have quit w/in 15years.) does not qualify.   Lung Cancer Screening Referral: No  Additional Screening:  Hepatitis C Screening:  Does qualify; Completed No  Vision Screening: Recommended annual ophthalmology exams for early detection of glaucoma and other disorders of the eye. Is the patient up to date with their annual  eye exam?  No  Who is the provider or what is the name of the office in which the patient attends annual eye exams? [facility] If pt is not established with a provider, would they like to be  referred to a provider to establish care? No .   Dental Screening: Recommended annual dental exams for proper oral hygiene  Community Resource Referral / Chronic Care Management: CRR required this visit?  No   CCM required this visit?  No      Plan:     I have personally reviewed and noted the following in the patients chart:   Medical and social history Use of alcohol, tobacco or illicit drugs  Current medications and supplements including opioid prescriptions. Patient is not currently taking opioid prescriptions. Functional ability and status Nutritional status Physical activity Advanced directives List of other physicians Hospitalizations, surgeries, and ER visits in previous 12 months Vitals Screenings to include cognitive, depression, and falls Referrals and appointments  In addition, I have reviewed and discussed with patient certain preventive protocols, quality metrics, and best practice recommendations. A written personalized care plan for preventive services as well as general preventive health recommendations were provided to patient.    Mr. Bourke , Thank you for taking time to come for your Medicare Wellness Visit. I appreciate your ongoing commitment to your health goals. Please review the following plan we discussed and let me know if I can assist you in the future.   These are the goals we discussed:  Goals   None     This is a list of the screening recommended for you and due dates:  Health Maintenance  Topic Date Due   Pneumococcal Vaccination (1 - PCV) Never done   Complete foot exam   Never done   Eye exam for diabetics  Never done   Urine Protein Check  Never done   HIV Screening  Never done   Hepatitis C Screening: USPSTF Recommendation to screen -  Ages 68-79 yo.  Never done   Colon Cancer Screening  Never done   Zoster (Shingles) Vaccine (1 of 2) Never done   Flu Shot  Never done   COVID-19 Vaccine (3 - Booster) 11/09/2021   Hemoglobin A1C  02/01/2022   Tetanus Vaccine  11/20/2030   HPV Vaccine  Aged [address] [place], [place]   12/01/2021   Nurse Notes: Memory test was not completed today due to mental health status. Care caps were given to caregiver, he verbalized understanding of where to obtain the vaccines. A colonoscopy was ordered today.

## 2021-12-02 ENCOUNTER — Telehealth: Payer: Self-pay

## 2021-12-02 NOTE — Telephone Encounter (Signed)
Called mother number not in service so called group home and spoke with head lady who schedules appointments and she said she wanted to schedule later in spring put in recall

## 2021-12-24 ENCOUNTER — Ambulatory Visit (INDEPENDENT_AMBULATORY_CARE_PROVIDER_SITE_OTHER): Payer: Medicare Other | Admitting: Urology

## 2021-12-24 ENCOUNTER — Other Ambulatory Visit: Payer: Self-pay

## 2021-12-24 ENCOUNTER — Encounter: Payer: Self-pay | Admitting: Urology

## 2021-12-24 VITALS — BP 117/77 | HR 98 | Ht 74.0 in | Wt 305.0 lb

## 2021-12-24 DIAGNOSIS — N401 Enlarged prostate with lower urinary tract symptoms: Secondary | ICD-10-CM | POA: Diagnosis not present

## 2021-12-24 DIAGNOSIS — N3941 Urge incontinence: Secondary | ICD-10-CM

## 2021-12-24 DIAGNOSIS — R3915 Urgency of urination: Secondary | ICD-10-CM

## 2021-12-24 LAB — MICROSCOPIC EXAMINATION
Bacteria, UA: NONE SEEN
RBC, Urine: NONE SEEN /hpf (ref 0–2)

## 2021-12-24 LAB — URINALYSIS, COMPLETE
Bilirubin, UA: NEGATIVE
Glucose, UA: NEGATIVE
Ketones, UA: NEGATIVE
Leukocytes,UA: NEGATIVE
Nitrite, UA: NEGATIVE
Protein,UA: NEGATIVE
RBC, UA: NEGATIVE
Specific Gravity, UA: >1.03 — ABNORMAL HIGH (ref 1.005–1.030)
Urobilinogen, Ur: 1 mg/dL (ref 0.2–1.0)
pH, UA: 5.5 (ref 5.0–7.5)

## 2021-12-24 MED ORDER — GEMTESA 75 MG PO TABS: 75.0000 mg | ORAL_TABLET | Freq: Every day | ORAL | 0 refills | Status: DC

## 2021-12-24 NOTE — Progress Notes (Signed)
12/24/2021 8:40 AM   Edwin [name] [birth year] [id number]  Referring provider: No referring provider defined for this encounter.  Chief Complaint  Patient presents with   Other    HPI: 56 y.o. male presents for 48-month follow-up.  His caregiver is present  At last visit given a trial of Myrbetriq for urgency and urge incontinence Caregiver states this worked initially however seems to have a lost some efficacy.  He does have urgency and occasional episodes of incontinence Remains on tamsulosin No dysuria or gross hematuria No flank, abdominal or pelvic pain   PMH: Past Medical History:  Diagnosis Date   Diabetes mellitus without complication ([place])     Surgical History: History reviewed. No pertinent surgical history.  Home Medications:  Allergies as of 12/24/2021       Reactions   Citrullus Vulgaris Hives        Medication List        Accurate as of December 24, 2021  8:40 AM. If you have any questions, ask your nurse or doctor.          STOP taking these medications    metFORMIN 1000 MG tablet Commonly known as: GLUCOPHAGE Stopped by: Abbie Sons, MD       TAKE these medications    Amantadine HCl 100 MG tablet TAKE 1 TABLET BY MOUTH TWICE DAILY   clonazePAM 0.5 MG tablet Commonly known as: KLONOPIN Take 0.5 mg by mouth at bedtime.   docusate sodium 250 MG capsule Commonly known as: COLACE TAKE (1) CAPSULE BY MOUTH ONCE DAILY.   escitalopram 10 MG tablet Commonly known as: LEXAPRO Take 10 mg by mouth daily.   Januvia 100 MG tablet Generic drug: sitaGLIPtin TAKE 1 TABLET BY MOUTH ONCE DAILY.   mirabegron ER 50 MG Tb24 tablet Commonly known as: Myrbetriq Take 1 tablet (50 mg total) by mouth daily.   omeprazole 20 MG capsule Commonly known as: PRILOSEC TAKE (1) CAPSULE BY MOUTH ONCE DAILY.   risperiDONE 3 MG tablet Commonly known as: RISPERDAL Take 3 mg by mouth daily.   simethicone 125 MG chewable tablet Commonly known  as: MYLICON Chew 0000000 mg by mouth 3 (three) times daily.   GNP Gas Relief 80 MG chewable tablet Generic drug: simethicone CHEW 1 TABLET BY MOUTH THREE TIMES DAILY BEFORE MEALS AND AT BEDTIME AS NEEDED.   simvastatin 20 MG tablet Commonly known as: ZOCOR TAKE 1 TABLET BY MOUTH ONCE DAILY.   tamsulosin 0.4 MG Caps capsule Commonly known as: FLOMAX TAKE (1) CAPSULE BY MOUTH ONCE DAILY.   thioridazine 100 MG tablet Commonly known as: MELLARIL TAKE 1 TABLET BY MOUTH TWICE DAILY   Vitamin D3 1.25 MG (50000 UT) Caps Take 1.25 mg by mouth daily.   Vitamin D3 50 MCG (2000 UT) Tabs TAKE 1 TABLET BY MOUTH ONCE DAILY.        Allergies:  Allergies  Allergen Reactions   Citrullus Vulgaris Hives    Family History: History reviewed. No pertinent family history.  Social History:  reports that he has [name] smoked. He has [name] used smokeless tobacco. He reports that he does not drink alcohol and does not use drugs.   Physical Exam: BP 117/77    Pulse 98    Ht 6\' 2"  (1.88 m)    Wt (!) 305 lb (138.3 kg)    BMI 39.16 kg/m   Constitutional:  Alert, No acute distress. HEENT: [place] AT, moist mucus membranes.  Trachea midline, no masses. Cardiovascular: No  clubbing, cyanosis, or edema. Respiratory: Normal respiratory effort, no increased work of breathing.   Laboratory Data:  Urinalysis Appearance-yellow, clear Dipstick-negative Microscopy-negative  Assessment & Plan:    1.  BPH with LUTS Stable on tamsulosin  2.  Urgency with urge incontinence Hold Myrbetriq Trial Gemtesa 1 tab daily Call back 1 month regarding efficacy and will send Rx if better efficacy than Myrbetriq Follow-up 1 year or as needed    Abbie Sons, MD  [facility] [address] [place], [place] [address] [phone]

## 2021-12-27 ENCOUNTER — Ambulatory Visit: Payer: Self-pay | Admitting: Urology

## 2022-01-17 ENCOUNTER — Other Ambulatory Visit: Payer: Self-pay

## 2022-01-18 ENCOUNTER — Other Ambulatory Visit: Payer: Self-pay | Admitting: Internal Medicine

## 2022-01-24 ENCOUNTER — Ambulatory Visit: Payer: Medicare Other | Admitting: Urology

## 2022-02-03 ENCOUNTER — Telehealth: Payer: Self-pay | Admitting: Family Medicine

## 2022-02-03 MED ORDER — TROSPIUM CHLORIDE 20 MG PO TABS: 20.0000 mg | ORAL_TABLET | Freq: Two times a day (BID) | ORAL | 3 refills | Status: DC

## 2022-02-03 NOTE — Telephone Encounter (Signed)
[name] notified and Rx sent to pharmacy.  ?

## 2022-02-03 NOTE — Telephone Encounter (Signed)
Can give a trial of trospium 20 mg twice daily #60/refill x3 ?

## 2022-02-03 NOTE — Telephone Encounter (Signed)
Patient's caregiver Lyda Jester called and states patient was given samples of Gemtesa and they are not working. He would like to know if there is another medication he should try?  ?

## 2022-02-23 ENCOUNTER — Other Ambulatory Visit: Payer: Self-pay | Admitting: *Deleted

## 2022-02-23 ENCOUNTER — Other Ambulatory Visit: Payer: Self-pay | Admitting: Internal Medicine

## 2022-02-23 MED ORDER — DOCUSATE SODIUM 250 MG PO CAPS: ORAL_CAPSULE | ORAL | 4 refills | Status: DC

## 2022-04-09 ENCOUNTER — Other Ambulatory Visit: Payer: Self-pay | Admitting: Internal Medicine

## 2022-05-04 ENCOUNTER — Other Ambulatory Visit: Payer: Self-pay | Admitting: Urology

## 2022-05-04 ENCOUNTER — Other Ambulatory Visit: Payer: Self-pay | Admitting: Internal Medicine

## 2022-05-25 ENCOUNTER — Other Ambulatory Visit: Payer: Self-pay | Admitting: Internal Medicine

## 2022-05-31 ENCOUNTER — Other Ambulatory Visit: Payer: Self-pay | Admitting: Internal Medicine

## 2022-05-31 ENCOUNTER — Other Ambulatory Visit: Payer: Self-pay | Admitting: Urology

## 2022-05-31 DIAGNOSIS — R350 Frequency of micturition: Secondary | ICD-10-CM

## 2022-06-20 ENCOUNTER — Other Ambulatory Visit: Payer: Self-pay | Admitting: Internal Medicine

## 2022-06-24 ENCOUNTER — Other Ambulatory Visit: Payer: Self-pay | Admitting: Urology

## 2022-06-24 DIAGNOSIS — R350 Frequency of micturition: Secondary | ICD-10-CM

## 2022-08-02 ENCOUNTER — Other Ambulatory Visit: Payer: Self-pay

## 2022-08-02 MED ORDER — SIMVASTATIN 20 MG PO TABS: 20.0000 mg | ORAL_TABLET | Freq: Every day | ORAL | 0 refills | Status: DC

## 2022-08-02 MED ORDER — OMEPRAZOLE 20 MG PO CPDR: DELAYED_RELEASE_CAPSULE | ORAL | 0 refills | Status: DC

## 2022-08-02 MED ORDER — SITAGLIPTIN PHOSPHATE 100 MG PO TABS: 100.0000 mg | ORAL_TABLET | Freq: Every day | ORAL | 0 refills | Status: DC

## 2022-08-02 MED ORDER — VITAMIN D3 1.25 MG (50000 UT) PO CAPS: 1.2500 mg | ORAL_CAPSULE | Freq: Every day | ORAL | 0 refills | Status: DC

## 2022-08-02 NOTE — Progress Notes (Signed)
Refills sent

## 2022-08-04 ENCOUNTER — Ambulatory Visit (INDEPENDENT_AMBULATORY_CARE_PROVIDER_SITE_OTHER): Payer: Medicare Other | Admitting: Nurse Practitioner

## 2022-08-04 ENCOUNTER — Encounter: Payer: Self-pay | Admitting: Nurse Practitioner

## 2022-08-04 VITALS — BP 115/85 | HR 93 | Ht 73.2 in | Wt 312.1 lb

## 2022-08-04 DIAGNOSIS — Z6841 Body Mass Index (BMI) 40.0 and over, adult: Secondary | ICD-10-CM

## 2022-08-04 DIAGNOSIS — E785 Hyperlipidemia, unspecified: Secondary | ICD-10-CM | POA: Diagnosis not present

## 2022-08-04 DIAGNOSIS — G4730 Sleep apnea, unspecified: Secondary | ICD-10-CM | POA: Diagnosis not present

## 2022-08-04 NOTE — Assessment & Plan Note (Signed)
Continue statin therapy for cardiovascular risk reduction. 

## 2022-08-04 NOTE — Progress Notes (Signed)
Established Patient Office Visit  Subjective:  Patient ID: Edwin [name], male    DOB: 11/05/1966  Age: 56 y.o. MRN: [id number]  CC: No chief complaint on file.    HPI  Edwin [name] presents to request for sleep study. He is using  CPAP machine for over 10 years. Patient has over 10 year h/o of sleep apnea his old machine is not working. He has h/o sleep apnea, diabetes, hyperlipidemia, mild MR, bipolar and schizophrenia. Patient lives in a group home and is accomiped by the care taker. His psych provider is at [place], [place].      HPI   Past Medical History:  Diagnosis Date   Diabetes mellitus without complication (HCC)     History reviewed. No pertinent surgical history.  History reviewed. No pertinent family history.  Social History   Socioeconomic History   Marital status: Single    Spouse name: Not on file   Number of children: 0   Years of education: Not on file   Highest education level: Not on file  Occupational History   Occupation: Disabled  Tobacco Use   Smoking status: Never   Smokeless tobacco: Never  Vaping Use   Vaping Use: Never used  Substance and Sexual Activity   Alcohol use: No   Drug use: No   Sexual activity: Not Currently  Other Topics Concern   Not on file  Social History Narrative   Not on file   Social Determinants of Health   Financial Resource Strain: Low Risk  (12/01/2021)   Overall Financial Resource Strain (CARDIA)    Difficulty of Paying Living Expenses: Not hard at all  Food Insecurity: No Food Insecurity (12/01/2021)   Hunger Vital Sign    Worried About Running Out of Food in the Last Year: Never true    Ran Out of Food in the Last Year: Never true  Transportation Needs: No Transportation Needs (12/01/2021)   PRAPARE - Administrator, Civil Service (Medical): No    Lack of Transportation (Non-Medical): No  Physical Activity: Insufficiently Active (12/01/2021)   Exercise Vital  Sign    Days of Exercise per Week: 3 days    Minutes of Exercise per Session: 10 min  Stress: No Stress Concern Present (12/01/2021)   [employer] of Occupational Health - Occupational Stress Questionnaire    Feeling of Stress : Not at all  Social Connections: Moderately Integrated (12/01/2021)   Social Connection and Isolation Panel [NHANES]    Frequency of Communication with Friends and Family: More than three times a week    Frequency of Social Gatherings with Friends and Family: Once a week    Attends Religious Services: 1 to 4 times per year    Active Member of [employer] or Organizations: Yes    Attends Banker Meetings: More than 4 times per year    Marital Status: Never married  Intimate Partner Violence: Not At Risk (12/01/2021)   Humiliation, Afraid, Rape, and Kick questionnaire    Fear of Current or Ex-Partner: No    Emotionally Abused: No    Physically Abused: No    Sexually Abused: No     Outpatient Medications Prior to Visit  Medication Sig Dispense Refill   Amantadine HCl 100 MG tablet TAKE 1 TABLET BY MOUTH TWICE DAILY 60 tablet 11   Cholecalciferol (VITAMIN D3) 1.25 MG (50000 UT) CAPS Take 1.25 mg by mouth daily. 12 capsule 0   Cholecalciferol (VITAMIN D3)  50 MCG (2000 UT) TABS TAKE 1 TABLET BY MOUTH ONCE DAILY. 30 tablet 0   clonazePAM (KLONOPIN) 0.5 MG tablet Take 0.5 mg by mouth at bedtime.     docusate sodium (COLACE) 250 MG capsule TAKE (1) CAPSULE BY MOUTH ONCE DAILY. 30 capsule 0   escitalopram (LEXAPRO) 10 MG tablet Take 10 mg by mouth daily.     GNP GAS RELIEF 80 MG chewable tablet CHEW 1 TABLET BY MOUTH THREE TIMES DAILY BEFORE MEALS AND AT BEDTIME AS NEEDED. 30 tablet 0   omeprazole (PRILOSEC) 20 MG capsule TAKE (1) CAPSULE BY MOUTH ONCE DAILY. 30 capsule 0   risperiDONE (RISPERDAL) 3 MG tablet Take 3 mg by mouth daily.     simethicone (MYLICON) 125 MG chewable tablet Chew 125 mg by mouth 3 (three) times daily.     simvastatin (ZOCOR) 20 MG  tablet Take 1 tablet (20 mg total) by mouth daily. 30 tablet 0   sitaGLIPtin (JANUVIA) 100 MG tablet Take 1 tablet (100 mg total) by mouth daily. 30 tablet 0   tamsulosin (FLOMAX) 0.4 MG CAPS capsule TAKE (1) CAPSULE BY MOUTH ONCE DAILY. 30 capsule 11   thioridazine (MELLARIL) 100 MG tablet TAKE 1 TABLET BY MOUTH TWICE DAILY 60 tablet 11   trospium (SANCTURA) 20 MG tablet TAKE 1 TABLET BY MOUTH TWICE DAILY 60 tablet 2   No facility-administered medications prior to visit.    Allergies  Allergen Reactions   Citrullus Vulgaris Hives    ROS Review of Systems  Constitutional: Negative.   HENT: Negative.    Eyes: Negative.   Respiratory: Negative.    Cardiovascular: Negative.   Gastrointestinal: Negative.   Genitourinary: Negative.   Musculoskeletal: Negative.   Skin: Negative.   Neurological:  Negative for dizziness, facial asymmetry and headaches.  Hematological: Negative.   Psychiatric/Behavioral:  Positive for behavioral problems. Negative for agitation and confusion.       Objective:    Physical Exam Constitutional:      Appearance: Normal appearance. He is obese.  HENT:     Head: Normocephalic.     Right Ear: Tympanic membrane normal.     Left Ear: Tympanic membrane normal.     Nose: Nose normal.     Mouth/Throat:     Mouth: Mucous membranes are moist.  Eyes:     Conjunctiva/sclera: Conjunctivae normal.     Pupils: Pupils are equal, round, and reactive to light.  Cardiovascular:     Rate and Rhythm: Normal rate and regular rhythm.     Pulses: Normal pulses.     Heart sounds: Normal heart sounds.  Pulmonary:     Effort: Pulmonary effort is normal.     Breath sounds: Normal breath sounds.  Abdominal:     General: Bowel sounds are normal.     Palpations: Abdomen is soft.  Musculoskeletal:        General: Normal range of motion.  Skin:    General: Skin is warm.     Capillary Refill: Capillary refill takes less than 2 seconds.  Neurological:     General: No  focal deficit present.     Mental Status: He is alert and oriented to person, place, and time. Mental status is at baseline.  Psychiatric:        Mood and Affect: Mood normal.        Behavior: Behavior normal.        Thought Content: Thought content normal.        Judgment: Judgment  normal.     BP 115/85   Pulse 93   Ht 6' 1.2" (1.859 m)   Wt (!) 312 lb 2 oz (141.6 kg)   BMI 40.96 kg/m  Wt Readings from Last 3 Encounters:  08/04/22 (!) 312 lb 2 oz (141.6 kg)  12/24/21 (!) 305 lb (138.3 kg)  08/24/21 (!) 302 lb 1.6 oz (137 kg)     Health Maintenance  Topic Date Due   FOOT EXAM  Never done   OPHTHALMOLOGY EXAM  Never done   URINE MICROALBUMIN  Never done   HIV Screening  Never done   Hepatitis C Screening  Never done   COLONOSCOPY (Pts 45-18yrs Insurance coverage will need to be confirmed)  Never done   Zoster Vaccines- Shingrix (1 of 2) Never done   COVID-19 Vaccine (3 - Mixed Product series) 11/09/2021   HEMOGLOBIN A1C  02/01/2022   INFLUENZA VACCINE  07/05/2022   TETANUS/TDAP  11/20/2030   HPV VACCINES  Aged Out    There are no preventive care reminders to display for this patient.  Lab Results  Component Value Date   TSH 1.40 05/13/2020   Lab Results  Component Value Date   WBC 4.8 05/13/2020   HGB 14.3 05/13/2020   HCT 44.1 05/13/2020   MCV 84.0 05/13/2020   PLT 189 05/13/2020   Lab Results  Component Value Date   NA 143 11/20/2020   K 4.3 11/20/2020   CO2 21 11/20/2020   GLUCOSE 80 11/20/2020   BUN 15 11/20/2020   CREATININE 1.58 (H) 11/20/2020   BILITOT 0.4 11/20/2020   ALKPHOS 73 07/11/2017   AST 11 11/20/2020   ALT 10 11/20/2020   PROT 6.4 11/20/2020   ALBUMIN 3.8 07/11/2017   CALCIUM 9.1 11/20/2020   ANIONGAP 10 07/11/2017   No results found for: "CHOL" No results found for: "HDL" No results found for: "LDLCALC" No results found for: "TRIG" No results found for: "CHOLHDL" Lab Results  Component Value Date   HGBA1C 4.8 08/03/2021       Assessment & Plan:   Problem List Items Addressed This Visit       Respiratory   Sleep apnea in adult - Primary    Referral sent for sleep study.        Other   Class 3 severe obesity with serious comorbidity and body mass index (BMI) of 40.0 to 44.9 in adult [facility])    Body mass index is 40.96 kg/m. Advised pt to lose weight. Advised patient to avoid trans fat, fatty and fried food. Follow a regular physical activity schedule.         Hyperlipidemia    Continue statin therapy for cardiovascular risk reduction.        No orders of the defined types were placed in this encounter.    Follow-up: No follow-ups on file.    Kara Dies, NP

## 2022-08-04 NOTE — Assessment & Plan Note (Signed)
Referral sent for sleep study.  

## 2022-08-04 NOTE — Assessment & Plan Note (Signed)
Body mass index is 40.96 kg/m. Advised pt to lose weight. Advised patient to avoid trans fat, fatty and fried food. Follow a regular physical activity schedule.

## 2022-08-31 ENCOUNTER — Other Ambulatory Visit: Payer: Self-pay | Admitting: Family Medicine

## 2022-08-31 MED ORDER — TROSPIUM CHLORIDE 20 MG PO TABS: 20.0000 mg | ORAL_TABLET | Freq: Two times a day (BID) | ORAL | 2 refills | Status: DC

## 2022-09-23 ENCOUNTER — Other Ambulatory Visit: Payer: Self-pay | Admitting: Family Medicine

## 2022-09-23 MED ORDER — TAMSULOSIN HCL 0.4 MG PO CAPS: ORAL_CAPSULE | ORAL | 11 refills | Status: DC

## 2022-09-27 ENCOUNTER — Other Ambulatory Visit: Payer: Self-pay | Admitting: *Deleted

## 2022-09-27 MED ORDER — VITAMIN D (ERGOCALCIFEROL) 50000 UNITS PO CAPS: 1.0000 | ORAL_CAPSULE | Freq: Every day | ORAL | 6 refills | Status: DC

## 2022-09-27 MED ORDER — SITAGLIPTIN PHOSPHATE 100 MG PO TABS: 100.0000 mg | ORAL_TABLET | Freq: Every day | ORAL | 6 refills | Status: DC

## 2022-09-27 MED ORDER — DOCUSATE SODIUM 250 MG PO CAPS
ORAL_CAPSULE | ORAL | 6 refills | Status: DC
Start: 2022-09-27 — End: 2022-11-24

## 2022-09-27 MED ORDER — OMEPRAZOLE 20 MG PO CPDR
DELAYED_RELEASE_CAPSULE | ORAL | 6 refills | Status: DC
Start: 2022-09-27 — End: 2024-04-09

## 2022-09-27 MED ORDER — SIMVASTATIN 20 MG PO TABS: 20.0000 mg | ORAL_TABLET | Freq: Every day | ORAL | 6 refills | Status: DC

## 2022-09-29 ENCOUNTER — Other Ambulatory Visit: Payer: Self-pay | Admitting: *Deleted

## 2022-09-29 MED ORDER — VITAMIN D (ERGOCALCIFEROL) 50000 UNITS PO CAPS: 1.0000 | ORAL_CAPSULE | ORAL | 6 refills | Status: DC

## 2022-11-24 ENCOUNTER — Ambulatory Visit (INDEPENDENT_AMBULATORY_CARE_PROVIDER_SITE_OTHER): Payer: Medicare Other | Admitting: Nurse Practitioner

## 2022-11-24 ENCOUNTER — Encounter: Payer: Self-pay | Admitting: Nurse Practitioner

## 2022-11-24 VITALS — BP 132/88 | HR 82 | Ht 73.5 in | Wt 316.0 lb

## 2022-11-24 DIAGNOSIS — F259 Schizoaffective disorder, unspecified: Secondary | ICD-10-CM | POA: Insufficient documentation

## 2022-11-24 DIAGNOSIS — N1831 Chronic kidney disease, stage 3a: Secondary | ICD-10-CM

## 2022-11-24 DIAGNOSIS — F250 Schizoaffective disorder, bipolar type: Secondary | ICD-10-CM

## 2022-11-24 DIAGNOSIS — E1122 Type 2 diabetes mellitus with diabetic chronic kidney disease: Secondary | ICD-10-CM

## 2022-11-24 DIAGNOSIS — Z794 Long term (current) use of insulin: Secondary | ICD-10-CM

## 2022-11-24 DIAGNOSIS — E785 Hyperlipidemia, unspecified: Secondary | ICD-10-CM

## 2022-11-24 LAB — POCT GLYCOSYLATED HEMOGLOBIN (HGB A1C)
HbA1c POC (<> result, manual entry): 4.8 % (ref 4.0–5.6)
HbA1c, POC (controlled diabetic range): 4.8 % (ref 0.0–7.0)
HbA1c, POC (prediabetic range): 4.8 % — AB (ref 5.7–6.4)
Hemoglobin A1C: 4.8 % (ref 4.0–5.6)

## 2022-11-24 NOTE — Assessment & Plan Note (Addendum)
Stable at present. Patient is followed by the mental health provider at [facility]. Signed FL 2 form

## 2022-11-24 NOTE — Assessment & Plan Note (Signed)
Continue statin therapy for cardiovascular risk reduction. Advised patient to consume balanced diet and follow regular exercise schedule. Limit the intake of fatty, fried foods, red meat, packaged foods and sugary drinks and foods. Consume veggies, fruits, low-fat daily, whole grains, poultry and nuts

## 2022-11-24 NOTE — Assessment & Plan Note (Signed)
Patient POCT hemoglobin A1c 4.8 in the office today. Advised pt to check the BS regularly, make a log and bring to next appointment.  Advised pt to eat variety of food including fruits, vegetables, whole grains, complex carbohydrates and proteins.

## 2022-11-24 NOTE — Progress Notes (Signed)
Established Patient Office Visit  Subjective:  Patient ID: Edwin [name], male    DOB: [birth year]  Age: 56 y.o. MRN: [id number]  CC:  Chief Complaint  Patient presents with   Form Completion    Fl2 needed. FBS was 111 this am.      HPI  Edwin [name] presents for FI 2 form to be completed with the care giver. He lives in [facility] group home. He has h/o of sleep apnea, bipolar, schizoaffective disorder. He is seen at [facility] [place] .   HPI   Past Medical History:  Diagnosis Date   Diabetes mellitus without complication ([place])     No past surgical history on file.  No family history on file.  Social History   Socioeconomic History   Marital status: Single    Spouse name: Not on file   Number of children: 0   Years of education: Not on file   Highest education level: Not on file  Occupational History   Occupation: Disabled  Tobacco Use   Smoking status: Never   Smokeless tobacco: Never  Vaping Use   Vaping Use: Never used  Substance and Sexual Activity   Alcohol use: No   Drug use: No   Sexual activity: Not Currently  Other Topics Concern   Not on file  Social History Narrative   Not on file   Social Determinants of Health   Financial Resource Strain: Low Risk  (12/01/2021)   Overall Financial Resource Strain (CARDIA)    Difficulty of Paying Living Expenses: Not hard at all  Food Insecurity: No Food Insecurity (12/01/2021)   Hunger Vital Sign    Worried About Running Out of Food in the Last Year: Never true    [place] in the Last Year: Never true  Transportation Needs: No Transportation Needs (12/01/2021)   PRAPARE - Hydrologist (Medical): No    Lack of Transportation (Non-Medical): No  Physical Activity: Insufficiently Active (12/01/2021)   Exercise Vital Sign    Days of Exercise per Week: 3 days    Minutes of Exercise per Session: 10 min  Stress: No Stress Concern Present  (12/01/2021)   [place]    Feeling of Stress : Not at all  Social Connections: Moderately Integrated (12/01/2021)   Social Connection and Isolation Panel [NHANES]    Frequency of Communication with Friends and Family: More than three times a week    Frequency of Social Gatherings with Friends and Family: Once a week    Attends Religious Services: 1 to 4 times per year    Active Member of [employer] or Organizations: Yes    Attends Archivist Meetings: More than 4 times per year    Marital Status: Never married  Intimate Partner Violence: Not At Risk (12/01/2021)   Humiliation, Afraid, Rape, and Kick questionnaire    Fear of Current or Ex-Partner: No    Emotionally Abused: No    Physically Abused: No    Sexually Abused: No     Outpatient Medications Prior to Visit  Medication Sig Dispense Refill   Amantadine HCl 100 MG tablet TAKE 1 TABLET BY MOUTH TWICE DAILY 60 tablet 11   Cholecalciferol (VITAMIN D3) 1.25 MG (50000 UT) CAPS Take 1.25 mg by mouth daily. 12 capsule 0   clonazePAM (KLONOPIN) 0.5 MG tablet Take 0.5 mg by mouth at bedtime.  escitalopram (LEXAPRO) 10 MG tablet Take 10 mg by mouth daily.     omeprazole (PRILOSEC) 20 MG capsule TAKE (1) CAPSULE BY MOUTH ONCE DAILY. 30 capsule 6   risperiDONE (RISPERDAL) 3 MG tablet Take 3 mg by mouth daily.     simvastatin (ZOCOR) 20 MG tablet Take 1 tablet (20 mg total) by mouth daily. 30 tablet 6   sitaGLIPtin (JANUVIA) 100 MG tablet Take 1 tablet (100 mg total) by mouth daily. 30 tablet 6   tamsulosin (FLOMAX) 0.4 MG CAPS capsule TAKE (1) CAPSULE BY MOUTH ONCE DAILY. 30 capsule 11   thioridazine (MELLARIL) 100 MG tablet TAKE 1 TABLET BY MOUTH TWICE DAILY 60 tablet 11   trospium (SANCTURA) 20 MG tablet Take 1 tablet (20 mg total) by mouth 2 (two) times daily. 60 tablet 2   Cholecalciferol (VITAMIN D3) 50 MCG (2000 UT) TABS TAKE 1 TABLET BY MOUTH ONCE DAILY. 30  tablet 0   docusate sodium (COLACE) 250 MG capsule daily 30 capsule 6   GNP GAS RELIEF 80 MG chewable tablet CHEW 1 TABLET BY MOUTH THREE TIMES DAILY BEFORE MEALS AND AT BEDTIME AS NEEDED. 30 tablet 0   simethicone (MYLICON) 144 MG chewable tablet Chew 125 mg by mouth 3 (three) times daily.     Vitamin D, Ergocalciferol, 50000 units CAPS Take 1 each by mouth once a week. 30 capsule 6   No facility-administered medications prior to visit.    Allergies  Allergen Reactions   Citrullus Vulgaris Hives    ROS Review of Systems  Constitutional: Negative.   HENT: Negative.    Eyes: Negative.   Respiratory:  Negative for chest tightness and shortness of breath.   Cardiovascular:  Negative for leg swelling.  Gastrointestinal: Negative.   Genitourinary: Negative.   Musculoskeletal: Negative.   Skin: Negative.   Neurological: Negative.   Psychiatric/Behavioral:  Positive for behavioral problems and hallucinations.       Objective:    Physical Exam Constitutional:      Appearance: Normal appearance. He is normal weight.  HENT:     Head: Normocephalic and atraumatic.     Right Ear: Tympanic membrane normal.     Left Ear: Tympanic membrane normal.     Mouth/Throat:     Mouth: Mucous membranes are moist.  Eyes:     Conjunctiva/sclera: Conjunctivae normal.     Pupils: Pupils are equal, round, and reactive to light.  Cardiovascular:     Rate and Rhythm: Normal rate and regular rhythm.     Pulses: Normal pulses.     Heart sounds: Normal heart sounds. No murmur heard. Pulmonary:     Effort: Pulmonary effort is normal.     Breath sounds: Normal breath sounds.  Abdominal:     General: Abdomen is flat. Bowel sounds are normal. There is distension.     Palpations: Abdomen is soft. There is no mass.     Hernia: No hernia is present.  Musculoskeletal:        General: Normal range of motion.  Skin:    General: Skin is warm.     Capillary Refill: Capillary refill takes less than 2  seconds.  Neurological:     General: No focal deficit present.     Mental Status: He is alert and oriented to person, place, and time. Mental status is at baseline.  Psychiatric:        Mood and Affect: Mood normal.        Behavior: Behavior normal.  Thought Content: Thought content normal.        Judgment: Judgment normal.     BP 132/88   Pulse 82   Ht 6' 1.5" (1.867 m)   Wt (!) 316 lb (143.3 kg)   SpO2 98%   BMI 41.13 kg/m  Wt Readings from Last 3 Encounters:  11/24/22 (!) 316 lb (143.3 kg)  08/04/22 (!) 312 lb 2 oz (141.6 kg)  12/24/21 (!) 305 lb (138.3 kg)     Health Maintenance  Topic Date Due   FOOT EXAM  Never done   OPHTHALMOLOGY EXAM  Never done   HIV Screening  Never done   Diabetic kidney evaluation - Urine ACR  Never done   Hepatitis C Screening  Never done   COLONOSCOPY (Pts 45-19yr Insurance coverage will need to be confirmed)  Never done   Zoster Vaccines- Shingrix (1 of 2) Never done   Diabetic kidney evaluation - eGFR measurement  11/20/2021   COVID-19 Vaccine (6 - 2023-24 season) 10/30/2022   Medicare Annual Wellness (AWV)  12/01/2022   HEMOGLOBIN A1C  05/26/2023   DTaP/Tdap/Td (2 - Td or Tdap) 11/20/2030   INFLUENZA VACCINE  Completed   HPV VACCINES  Aged Out    There are no preventive care reminders to display for this patient.  Lab Results  Component Value Date   TSH 1.40 05/13/2020   Lab Results  Component Value Date   WBC 4.8 05/13/2020   HGB 14.3 05/13/2020   HCT 44.1 05/13/2020   MCV 84.0 05/13/2020   PLT 189 05/13/2020   Lab Results  Component Value Date   NA 143 11/20/2020   K 4.3 11/20/2020   CO2 21 11/20/2020   GLUCOSE 80 11/20/2020   BUN 15 11/20/2020   CREATININE 1.58 (H) 11/20/2020   BILITOT 0.4 11/20/2020   ALKPHOS 73 07/11/2017   AST 11 11/20/2020   ALT 10 11/20/2020   PROT 6.4 11/20/2020   ALBUMIN 3.8 07/11/2017   CALCIUM 9.1 11/20/2020   ANIONGAP 10 07/11/2017   No results found for: "CHOL" No  results found for: "HDL" No results found for: "LDLCALC" No results found for: "TRIG" No results found for: "CHOLHDL" Lab Results  Component Value Date   HGBA1C 4.8 11/24/2022   HGBA1C 4.8 11/24/2022   HGBA1C 4.8 (A) 11/24/2022   HGBA1C 4.8 11/24/2022      Assessment & Plan:   Problem List Items Addressed This Visit       Endocrine   Type 2 diabetes mellitus with stage 3a chronic kidney disease, without long-term current use of insulin ([place] - Primary    Patient POCT hemoglobin A1c 4.8 in the office today. Advised pt to check the BS regularly, make a log and bring to next appointment.  Advised pt to eat variety of food including fruits, vegetables, whole grains, complex carbohydrates and proteins.        Relevant Orders   POCT HgB A1C (Completed)     Other   Hyperlipidemia    Continue statin therapy for cardiovascular risk reduction. Advised patient to consume balanced diet and follow regular exercise schedule. Limit the intake of fatty, fried foods, red meat, packaged foods and sugary drinks and foods. Consume veggies, fruits, low-fat daily, whole grains, poultry and nuts         Schizoaffective disorder ([place]    Stable at present. Patient [facility] Signed [place] 2 form  No orders of the defined types were placed in this encounter.    Follow-up: No follow-ups on file.    Theresia Lo, NP

## 2022-12-08 ENCOUNTER — Other Ambulatory Visit: Payer: Self-pay | Admitting: Urology

## 2022-12-26 ENCOUNTER — Ambulatory Visit (INDEPENDENT_AMBULATORY_CARE_PROVIDER_SITE_OTHER): Payer: Medicare Other | Admitting: Urology

## 2022-12-26 ENCOUNTER — Encounter: Payer: Self-pay | Admitting: Urology

## 2022-12-26 VITALS — BP 140/87 | HR 80 | Ht 74.0 in | Wt 318.0 lb

## 2022-12-26 DIAGNOSIS — N3941 Urge incontinence: Secondary | ICD-10-CM | POA: Diagnosis not present

## 2022-12-26 DIAGNOSIS — N401 Enlarged prostate with lower urinary tract symptoms: Secondary | ICD-10-CM

## 2022-12-26 LAB — URINALYSIS, COMPLETE
Bilirubin, UA: NEGATIVE
Glucose, UA: NEGATIVE
Ketones, UA: NEGATIVE
Leukocytes,UA: NEGATIVE
Nitrite, UA: NEGATIVE
RBC, UA: NEGATIVE
Specific Gravity, UA: >1.03 — ABNORMAL HIGH (ref 1.005–1.030)
Urobilinogen, Ur: 0.2 mg/dL (ref 0.2–1.0)
pH, UA: 6 (ref 5.0–7.5)

## 2022-12-26 LAB — MICROSCOPIC EXAMINATION

## 2022-12-26 LAB — BLADDER SCAN AMB NON-IMAGING: Scan Result: 40

## 2022-12-26 NOTE — Progress Notes (Signed)
   12/26/2022 9:17 AM   Edwin [name] Dec 25, 1965 [id number]  Referring provider: Cletis Athens, MD [address] [place],  [place] [address]  Chief Complaint  Patient presents with   Benign Prostatic Hypertrophy    HPI: 57 y.o. male presents for 1 year follow-up.  His caregiver is present  Has failed Myrbetriq, Gemtesa and trospium for his storage related voiding symptoms Remains on tamsulosin No dysuria, gross hematuria No flank, abdominal or pelvic pain   PMH: Past Medical History:  Diagnosis Date   Diabetes mellitus without complication ([place])     Surgical History: No past surgical history on file.  Home Medications:  Allergies as of 12/26/2022       Reactions   Citrullus Vulgaris Hives        Medication List        Accurate as of December 26, 2022  9:17 AM. If you have any questions, ask your nurse or doctor.          Amantadine HCl 100 MG tablet TAKE 1 TABLET BY MOUTH TWICE DAILY   clonazePAM 0.5 MG tablet Commonly known as: KLONOPIN Take 0.5 mg by mouth at bedtime.   escitalopram 10 MG tablet Commonly known as: LEXAPRO Take 10 mg by mouth daily.   omeprazole 20 MG capsule Commonly known as: PRILOSEC TAKE (1) CAPSULE BY MOUTH ONCE DAILY.   risperiDONE 3 MG tablet Commonly known as: RISPERDAL Take 3 mg by mouth daily.   simvastatin 20 MG tablet Commonly known as: ZOCOR Take 1 tablet (20 mg total) by mouth daily.   sitaGLIPtin 100 MG tablet Commonly known as: Januvia Take 1 tablet (100 mg total) by mouth daily.   tamsulosin 0.4 MG Caps capsule Commonly known as: FLOMAX TAKE (1) CAPSULE BY MOUTH ONCE DAILY.   thioridazine 100 MG tablet Commonly known as: MELLARIL TAKE 1 TABLET BY MOUTH TWICE DAILY   trospium 20 MG tablet Commonly known as: SANCTURA TAKE ONE TABLET BY MOUTH TWICE DAILY   Vitamin D3 1.25 MG (50000 UT) Caps Take 1.25 mg by mouth daily.        Allergies:  Allergies  Allergen Reactions   Citrullus Vulgaris  Hives    Family History: No family history on file.  Social History:  reports that he has [name] smoked. He has [name] used smokeless tobacco. He reports that he does not drink alcohol and does not use drugs.   Physical Exam: BP (!) 140/87   Pulse 80   Ht 6\' 2"  (1.88 m)   Wt (!) 318 lb (144.2 kg)   BMI 40.83 kg/m   Constitutional:  Alert, No acute distress. HEENT: [place] AT, moist mucus membranes.  Trachea midline, no masses. Cardiovascular: No clubbing, cyanosis, or edema. Respiratory: Normal respiratory effort, no increased work of breathing.   Assessment & Plan:    1.  BPH with LUTS Stable on tamsulosin PVR today 40 mL  2.  Urgency with urge incontinence Has failed beta 3 agonist x 2 and trospium Not interested in pursuing PTNS Annual follow-up with PVR; will call earlier for worsening voiding symptoms   Abbie Sons, MD  [facility] [address], [place], [place] [address] [phone]

## 2023-02-21 ENCOUNTER — Telehealth: Payer: Self-pay | Admitting: Internal Medicine

## 2023-02-21 NOTE — Telephone Encounter (Signed)
[facility] called from feeling great sleepy study need recent office notes sent to [phone]

## 2023-02-24 NOTE — Telephone Encounter (Signed)
Attempted to return phone call but phone number is not a working number.  Pt's PCP is [facility] and we cannot release any records.

## 2023-08-21 ENCOUNTER — Other Ambulatory Visit: Payer: Self-pay

## 2023-08-24 ENCOUNTER — Ambulatory Visit: Payer: Medicare Other | Admitting: Physician Assistant

## 2023-09-20 NOTE — Progress Notes (Unsigned)
Celso Amy, PA-C [address]  [address]  [place], [place] [address]  Main: [phone]  Fax: [phone]   Gastroenterology Consultation  Referring Provider:     [name], MD Primary Care Physician:  [name], MD Primary Gastroenterologist:  Celso Amy, PA-C  Reason for Consultation:     Abdominal pain        HPI:   Edwin [name] is a 57 y.o. y/o male referred for consultation & management  by [name], MD.    Here to evaluate abdominal pain.  He lives in a group home.  He is here today with his sister and a caregiver from the group home.  Patient has complained of generalized abdominal pain for approximately 8 months.  Pain has been mostly located above and behind the umbilicus.  Also having some right lower quadrant abdominal pain.  Occurs in the morning.  Can also occur after eating.  He has not had any nausea, vomiting, rectal bleeding, or weight loss.  Has history of mild constipation and takes stool softener 3 times daily with benefit.  He has not had any heartburn or dysphagia.  Patient sisters states he was diagnosed with peptic ulcer many years ago.  Reportedly had EGD at [facility], results are not available.  He has never had a colonoscopy.  No previous abdominal or pelvic surgeries.  He takes Prilosec 20 mg daily.  Labs 08/2023 showed stage III chronic kidney disease with BUN 17, creatinine 1.81, GFR 43.  Followed by nephrology.  Labs 06/2023 showed normal hemoglobin 14.4.  Medical history of type 2 diabetes, CKD, obesity, hyperparathyroidism, bipolar, schizophrenia, sleep apnea.  No recent abdominal imaging.   Last abdominal ultrasound from 2010 showed fatty liver, otherwise unrevealing.  Past Medical History:  Diagnosis Date   Diabetes mellitus without complication (HCC)     History reviewed. No pertinent surgical history.  Prior to Admission medications   Medication Sig Start Date End Date Taking? Authorizing Provider   Amantadine HCl 100 MG tablet TAKE 1 TABLET BY MOUTH TWICE DAILY 05/06/21   [name], MD  Cholecalciferol (VITAMIN D3) 1.25 MG (50000 UT) CAPS Take 1.25 mg by mouth daily. 08/02/22   [name], MD  clonazePAM (KLONOPIN) 0.5 MG tablet Take 0.5 mg by mouth at bedtime.    [provider]  escitalopram (LEXAPRO) 10 MG tablet Take 10 mg by mouth daily. 07/06/17   [provider]  omeprazole (PRILOSEC) 20 MG capsule TAKE (1) CAPSULE BY MOUTH ONCE DAILY. 09/27/22   [name], MD  risperiDONE (RISPERDAL) 3 MG tablet Take 3 mg by mouth daily. 07/06/17   [provider]  simvastatin (ZOCOR) 20 MG tablet Take 1 tablet (20 mg total) by mouth daily. 09/27/22   [name], MD  sitaGLIPtin (JANUVIA) 100 MG tablet Take 1 tablet (100 mg total) by mouth daily. 09/27/22   [name], MD  tamsulosin (FLOMAX) 0.4 MG CAPS capsule TAKE (1) CAPSULE BY MOUTH ONCE DAILY. 09/23/22   Stoioff, Verna Czech, MD  thioridazine (MELLARIL) 100 MG tablet TAKE 1 TABLET BY MOUTH TWICE DAILY 05/06/21   [name], MD  trospium (SANCTURA) 20 MG tablet TAKE ONE TABLET BY MOUTH TWICE DAILY 12/08/22   Stoioff, Verna Czech, MD    History reviewed. No pertinent family history.   Social History   Tobacco Use   Smoking status: Never   Smokeless tobacco: Never  Vaping Use   Vaping status: Never Used  Substance Use Topics  Alcohol use: No   Drug use: No    Allergies as of 09/21/2023 - Review Complete 09/21/2023  Allergen Reaction Noted   Citrullus vulgaris Hives 04/23/2014    Review of Systems:    All systems reviewed and negative except where noted in HPI.   Physical Exam:  BP 104/73   Pulse 93   Temp 97.7 F (36.5 C)   Ht 6\' 2"  (1.88 m)   Wt (!) 312 lb 12.8 oz (141.9 kg)   BMI 40.16 kg/m  No LMP for male patient.  General:   Alert,  Well-developed, well-nourished, pleasant and cooperative in NAD Lungs:  Respirations even and unlabored.  Clear throughout to auscultation.   No wheezes,  crackles, or rhonchi. No acute distress. Heart:  Regular rate and rhythm; no murmurs, clicks, rubs, or gallops. Abdomen:  Normal bowel sounds.  No bruits.  Soft, and obese without masses, hepatosplenomegaly or hernias noted.  There is tenderness above and around the umbilicus.  Also some right lower quadrant tenderness.  Rest of abdomen is not tender.   No guarding or rebound tenderness.    Neurologic: Poor historian.  Some mental disability.;  grossly normal neurologically. Psych:  Alert and cooperative. Normal mood and affect.  Imaging Studies: No results found.  Assessment and Plan:   Edwin [name] is a 57 y.o. y/o male has been referred for generalized abdominal pain.  Mostly localized above and behind the umbilicus.  Also having right lower quadrant abdominal pain.  Uncertain etiology.  No previous abdominal surgeries.  I am ordering abdominal pelvic CT for further evaluation.  Pending CT results, then we can decide about scheduling a colonoscopy.  He has never had a colonoscopy.  He is overdue for first screening colonoscopy.  He has some mental disability and lives in a group home.  Poor historian.  1.  Abdominal pain: RLQ and Peri-Umbilical (above and behind the naval)  Abdominal / Pelvic CT without Contrast   2.  CKD stage 3  Followed by Nephrology  3.  Constipation  Continue Colace stool softener 3 times daily  4.  GERD  Continue Prilosec 20 mg once daily.  3.  Colon cancer screening: No previous colonoscopy  Follow-up in 1 month and then we can decide about scheduling colonoscopy.    Follow up 1 month.  Celso Amy, PA-C

## 2023-09-21 ENCOUNTER — Encounter: Payer: Self-pay | Admitting: Physician Assistant

## 2023-09-21 ENCOUNTER — Ambulatory Visit (INDEPENDENT_AMBULATORY_CARE_PROVIDER_SITE_OTHER): Payer: Medicare Other | Admitting: Physician Assistant

## 2023-09-21 VITALS — BP 104/73 | HR 93 | Temp 97.7°F | Ht 74.0 in | Wt 312.8 lb

## 2023-09-21 DIAGNOSIS — R1084 Generalized abdominal pain: Secondary | ICD-10-CM

## 2023-09-21 DIAGNOSIS — R1033 Periumbilical pain: Secondary | ICD-10-CM | POA: Diagnosis not present

## 2023-09-21 DIAGNOSIS — K5900 Constipation, unspecified: Secondary | ICD-10-CM

## 2023-09-21 DIAGNOSIS — R1031 Right lower quadrant pain: Secondary | ICD-10-CM

## 2023-09-21 DIAGNOSIS — N183 Chronic kidney disease, stage 3 unspecified: Secondary | ICD-10-CM

## 2023-09-21 DIAGNOSIS — K219 Gastro-esophageal reflux disease without esophagitis: Secondary | ICD-10-CM

## 2023-09-21 NOTE — Patient Instructions (Signed)
CT abdomen and pelvis scheduled [facility]  arrive @ 8:15 am 09/27/23 . Nothing to eat/drink 4 hours prior.  [address] [place] [address]

## 2023-09-27 ENCOUNTER — Other Ambulatory Visit: Payer: Medicare Other

## 2023-09-27 ENCOUNTER — Ambulatory Visit
Admission: RE | Admit: 2023-09-27 | Discharge: 2023-09-27 | Disposition: A | Discharge status: Home / Self Care | Payer: Medicare Other | Source: Ambulatory Visit | Attending: Physician Assistant | Admitting: Physician Assistant

## 2023-09-27 DIAGNOSIS — R1084 Generalized abdominal pain: Secondary | ICD-10-CM | POA: Insufficient documentation

## 2023-10-23 ENCOUNTER — Telehealth: Payer: Self-pay

## 2023-10-23 ENCOUNTER — Other Ambulatory Visit: Payer: Self-pay

## 2023-10-23 ENCOUNTER — Emergency Department: Payer: Medicare Other

## 2023-10-23 DIAGNOSIS — E1122 Type 2 diabetes mellitus with diabetic chronic kidney disease: Secondary | ICD-10-CM | POA: Diagnosis not present

## 2023-10-23 DIAGNOSIS — F840 Autistic disorder: Secondary | ICD-10-CM | POA: Insufficient documentation

## 2023-10-23 DIAGNOSIS — M25552 Pain in left hip: Secondary | ICD-10-CM | POA: Diagnosis present

## 2023-10-23 DIAGNOSIS — Z7984 Long term (current) use of oral hypoglycemic drugs: Secondary | ICD-10-CM | POA: Diagnosis not present

## 2023-10-23 DIAGNOSIS — N189 Chronic kidney disease, unspecified: Secondary | ICD-10-CM | POA: Diagnosis not present

## 2023-10-23 LAB — COMPREHENSIVE METABOLIC PANEL
ALT: 24 U/L (ref 0–44)
AST: 21 U/L (ref 15–41)
Albumin: 3.9 g/dL (ref 3.5–5.0)
Alkaline Phosphatase: 113 U/L (ref 38–126)
Anion gap: 9 (ref 5–15)
BUN: 19 mg/dL (ref 6–20)
CO2: 22 mmol/L (ref 22–32)
Calcium: 8.8 mg/dL — ABNORMAL LOW (ref 8.9–10.3)
Chloride: 109 mmol/L (ref 98–111)
Creatinine, Ser: 1.95 mg/dL — ABNORMAL HIGH (ref 0.61–1.24)
GFR, Estimated: 39 mL/min — ABNORMAL LOW (ref >60)
Glucose, Bld: 121 mg/dL — ABNORMAL HIGH (ref 70–99)
Potassium: 4.1 mmol/L (ref 3.5–5.1)
Sodium: 140 mmol/L (ref 135–145)
Total Bilirubin: 0.3 mg/dL (ref <1.2)
Total Protein: 7.2 g/dL (ref 6.5–8.1)

## 2023-10-23 LAB — CBC
HCT: 46.9 % (ref 39.0–52.0)
Hemoglobin: 15.2 g/dL (ref 13.0–17.0)
MCH: 27.9 pg (ref 26.0–34.0)
MCHC: 32.4 g/dL (ref 30.0–36.0)
MCV: 86.1 fL (ref 80.0–100.0)
Platelets: 206 10*3/uL (ref 150–400)
RBC: 5.45 MIL/uL (ref 4.22–5.81)
RDW: 13.9 % (ref 11.5–15.5)
WBC: 5.1 10*3/uL (ref 4.0–10.5)
nRBC: 0 % (ref 0.0–0.2)

## 2023-10-23 LAB — LIPASE, BLOOD: Lipase: 27 U/L (ref 11–51)

## 2023-10-23 NOTE — ED Triage Notes (Signed)
Pt presents to ER with sister with c/o left side abd pain, back pain, and left hip pain that has been ongoing since the summer time of this year.  Pt had recent ct a/p w/o contrast that was negative for any significant findings.  Last BM was yesterday.  Denies any n/v/d.  Pt does have hx of autism, but is verbal, with some delay.

## 2023-10-23 NOTE — Telephone Encounter (Signed)
Spoke with Lyda Jester at [employer].  Call and notify patient abdominal pelvic CT without contrast showed no acute abnormality.  There was fatty infiltration of the pancreas.  Recommend low-fat diet, exercise, and weight loss.  No gallstones.  Liver, stomach, intestines are all normal.  No evidence of cancer or masses.  Celso Amy, PA-C

## 2023-10-23 NOTE — Progress Notes (Signed)
Call and notify patient abdominal pelvic CT without contrast showed no acute abnormality.  There was fatty infiltration of the pancreas.  Recommend low-fat diet, exercise, and weight loss.  No gallstones.  Liver, stomach, intestines are all normal.  No evidence of cancer or masses. Celso Amy, PA-C

## 2023-10-24 ENCOUNTER — Emergency Department
Admission: EM | Admit: 2023-10-24 | Discharge: 2023-10-24 | Disposition: A | Discharge status: Home / Self Care | Payer: Medicare Other | Attending: Emergency Medicine | Admitting: Emergency Medicine

## 2023-10-24 DIAGNOSIS — M25552 Pain in left hip: Secondary | ICD-10-CM | POA: Diagnosis not present

## 2023-10-24 LAB — URINALYSIS, ROUTINE W REFLEX MICROSCOPIC
Bacteria, UA: NONE SEEN
Bilirubin Urine: NEGATIVE
Glucose, UA: >=500 mg/dL — AB
Hgb urine dipstick: NEGATIVE
Ketones, ur: NEGATIVE mg/dL
Leukocytes,Ua: NEGATIVE
Nitrite: NEGATIVE
Protein, ur: NEGATIVE mg/dL
Specific Gravity, Urine: 1.038 — ABNORMAL HIGH (ref 1.005–1.030)
pH: 5 (ref 5.0–8.0)

## 2023-10-24 MED ORDER — HYDROCODONE-ACETAMINOPHEN 5-325 MG PO TABS
1.0000 | ORAL_TABLET | Freq: Once | ORAL | Status: AC
  Administered 2023-10-24: 1 via ORAL
  Filled 2023-10-24: qty 1

## 2023-10-24 MED ORDER — ONDANSETRON 4 MG PO TBDP: 4.0000 mg | ORAL_TABLET | Freq: Four times a day (QID) | ORAL | 0 refills | Status: DC | PRN

## 2023-10-24 MED ORDER — HYDROCODONE-ACETAMINOPHEN 5-325 MG PO TABS
1.0000 | ORAL_TABLET | Freq: Four times a day (QID) | ORAL | 0 refills | Status: DC | PRN
Start: 2023-10-24 — End: 2024-07-18

## 2023-10-24 MED ORDER — IBUPROFEN 800 MG PO TABS: 800.0000 mg | ORAL_TABLET | Freq: Three times a day (TID) | ORAL | 0 refills | Status: DC | PRN

## 2023-10-24 MED ORDER — IBUPROFEN 800 MG PO TABS
800.0000 mg | ORAL_TABLET | Freq: Once | ORAL | Status: AC
Start: 2023-10-24 — End: 2023-10-24
  Administered 2023-10-24: 800 mg via ORAL
  Filled 2023-10-24: qty 1

## 2023-10-24 MED ORDER — ONDANSETRON 4 MG PO TBDP
4.0000 mg | ORAL_TABLET | Freq: Once | ORAL | Status: AC
  Administered 2023-10-24: 4 mg via ORAL
  Filled 2023-10-24: qty 1

## 2023-10-24 NOTE — Discharge Instructions (Addendum)
Please follow-up with orthopedics as an outpatient for further evaluation of your left hip pain.  Please avoid ibuprofen, aspirin, Aleve, Goody powders given your chronic kidney disease.  You are being provided a prescription for opiates (also known as narcotics) for pain control.  Opiates can be addictive and should only be used when absolutely necessary for pain control when other alternatives do not work.  We recommend you only use them for the recommended amount of time and only as prescribed.  Please do not take with other sedative medications or alcohol.  Please do not drive, operate machinery, make important decisions while taking opiates.  Please note that these medications can be addictive and have high abuse potential.  Patients can become addicted to narcotics after only taking them for a few days.  Please keep these medications locked away from children, teenagers or any family members with history of substance abuse.  Narcotic pain medicine may also make you constipated.  You may use over-the-counter medications such as MiraLAX, Colace to prevent constipation.  If you become constipated, you may use over-the-counter enemas as needed.  Itching and nausea are also common side effects of narcotic pain medication.  If you develop uncontrolled vomiting or a rash, please stop these medications and seek medical care.

## 2023-10-24 NOTE — ED Provider Notes (Signed)
[facility] Provider Note    Event Date/Time   First MD Initiated Contact with Patient 10/24/23 0121     (approximate)   History   Abdominal Pain and Hip Pain (/)   HPI  Edwin [name] is a 57 y.o. male with history of obesity, diabetes, chronic kidney disease, autism, bipolar, schizophrenia who presents to the emergency department with complaints of left-sided hip pain that has been ongoing for months.  He is being seen by gastroenterologist as an outpatient.  He has been taking Tylenol without relief.  Tonight he began complaining of increased pain in the left hip that is worse with walking.  Family member noticed that pain was worse when he tried to get up into a vehicle today.  No falls.  He is able to ambulate.  He is not having any fevers, vomiting, diarrhea, dysuria, hematuria.  He denies numbness, tingling, weakness, bowel or bladder incontinence.  He is able to ambulate.  Patient lives in a group home.   Patient had a CT scan of his abdomen and pelvis on 09/27/2023 which showed fatty infiltration of the pancreas but no other acute abnormality.  Appendix was normal.  No diverticulosis.  History provided by patient, family.    Past Medical History:  Diagnosis Date   Diabetes mellitus without complication (HCC)     History reviewed. No pertinent surgical history.  MEDICATIONS:  Prior to Admission medications   Medication Sig Start Date End Date Taking? Authorizing Provider  Amantadine HCl 100 MG tablet TAKE 1 TABLET BY MOUTH TWICE DAILY 05/06/21   [name], MD  Cholecalciferol (VITAMIN D3) 1.25 MG (50000 UT) CAPS Take 1.25 mg by mouth daily. 08/02/22   [name], MD  clonazePAM (KLONOPIN) 0.5 MG tablet Take 0.5 mg by mouth at bedtime.    [provider]  empagliflozin (JARDIANCE) 10 MG TABS tablet Take 1 tablet by mouth daily. 07/04/23   [provider]  ERGOCALCIFEROL PO Take by mouth.    [provider]   escitalopram (LEXAPRO) 10 MG tablet Take 10 mg by mouth daily. 07/06/17   [provider]  omeprazole (PRILOSEC) 20 MG capsule TAKE (1) CAPSULE BY MOUTH ONCE DAILY. 09/27/22   [name], MD  risperiDONE (RISPERDAL) 3 MG tablet Take 3 mg by mouth daily. 07/06/17   [provider]  simvastatin (ZOCOR) 20 MG tablet Take 1 tablet (20 mg total) by mouth daily. 09/27/22   [name], MD  sitaGLIPtin (JANUVIA) 100 MG tablet Take 1 tablet (100 mg total) by mouth daily. 09/27/22   [name], MD  thioridazine (MELLARIL) 100 MG tablet TAKE 1 TABLET BY MOUTH TWICE DAILY 05/06/21   [name], MD    Physical Exam   Triage Vital Signs: ED Triage Vitals  Encounter Vitals Group     BP 10/23/23 2036 (!) 144/91     Systolic BP Percentile --      Diastolic BP Percentile --      Pulse Rate 10/23/23 2036 79     Resp 10/23/23 2036 18     Temp 10/23/23 2036 97.8 F (36.6 C)     Temp Source 10/23/23 2036 Oral     SpO2 10/23/23 2036 98 %     Weight 10/23/23 2041 (!) 313 lb (142 kg)     Height 10/23/23 2041 6\' 2"  (1.88 m)     Head Circumference --      Peak Flow --      Pain Score  10/23/23 2041 6     Pain Loc --      Pain Education --      Exclude from Growth Chart --     Most recent vital signs: Vitals:   10/23/23 2036 10/23/23 2356  BP: (!) 144/91 122/77  Pulse: 79 72  Resp: 18 16  Temp: 97.8 F (36.6 C) 98.3 F (36.8 C)  SpO2: 98% 92%    CONSTITUTIONAL: Alert, responds appropriately to questions. Well-appearing; well-nourished HEAD: Normocephalic, atraumatic EYES: Conjunctivae clear, pupils appear equal, sclera nonicteric ENT: normal nose; moist mucous membranes NECK: Supple, normal ROM CARD: RRR; S1 and S2 appreciated RESP: Normal chest excursion without splinting or tachypnea; breath sounds clear and equal bilaterally; no wheezes, no rhonchi, no rales, no hypoxia or respiratory distress, speaking full sentences ABD/GI: Non-distended; soft, non-tender, no  rebound, no guarding, no peritoneal signs, no tenderness in McBurney's point, no tenderness in the left abdomen BACK: The back appears normal, no midline spinal tenderness, step-off or deformity, no CVA tenderness, no rash or other lesions noted EXT: Normal ROM in all joints; no deformity noted, no edema, no pain with internal or external rotation of the hip or flexion or extension, left leg is warm and well-perfused, no calf tenderness or calf swelling SKIN: Normal color for age and race; warm; no rash on exposed skin NEURO: Moves all extremities equally, normal speech, reports normal sensation, 2+ deep tendon reflexes in bilateral lower extremities, no clonus PSYCH: The patient's mood and manner are appropriate.   ED Results / Procedures / Treatments   LABS: (all labs ordered are listed, but only abnormal results are displayed) Labs Reviewed  COMPREHENSIVE METABOLIC PANEL - Abnormal; Notable for the following components:      Result Value   Glucose, Bld 121 (*)    Creatinine, Ser 1.95 (*)    Calcium 8.8 (*)    GFR, Estimated 39 (*)    All other components within normal limits  URINALYSIS, ROUTINE W REFLEX MICROSCOPIC - Abnormal; Notable for the following components:   Color, Urine YELLOW (*)    APPearance CLEAR (*)    Specific Gravity, Urine 1.038 (*)    Glucose, UA >=500 (*)    All other components within normal limits  CBC  LIPASE, BLOOD     EKG:  EKG Interpretation Date/Time:    Ventricular Rate:    PR Interval:    QRS Duration:    QT Interval:    QTC Calculation:   R Axis:      Text Interpretation:           RADIOLOGY: My personal review and interpretation of imaging: Left hip x-ray shows no acute abnormality.  I have personally reviewed all radiology reports.   DG HIP UNILAT WITH PELVIS 2-3 VIEWS LEFT  Result Date: 10/23/2023 CLINICAL DATA:  Acute left hip pain, no known injury, initial encounter EXAM: DG HIP (WITH OR WITHOUT PELVIS) 3V LEFT COMPARISON:   None Available. FINDINGS: Pelvic ring is intact. Degenerative changes of the hip joints are noted bilaterally. No acute fracture or dislocation is noted. No soft tissue changes are seen. IMPRESSION: Degenerative changes without acute abnormality. Electronically Signed   By: Alcide Clever M.D.   On: 10/23/2023 23:15     PROCEDURES:  Critical Care performed: No      Procedures    IMPRESSION / MDM / ASSESSMENT AND PLAN / ED COURSE  I reviewed the triage vital signs and the nursing notes.    Patient here with  complaints of left side and left hip pain.   DIFFERENTIAL DIAGNOSIS (includes but not limited to):   Arthritis, tendinitis, doubt septic arthritis, gout, fracture.  Low suspicion for diverticulitis, colitis, bowel obstruction, kidney stone, UTI, cauda equina, epidural abscess or hematoma, discitis or osteomyelitis, transverse myelitis   Patient's presentation is most consistent with acute complicated illness / injury requiring diagnostic workup.   PLAN: Patient here with complaints of left hip pain.  Abdominal exam is benign.  Symptoms ongoing for months and had a negative CT of the abdomen pelvis less than a month ago.  No indication that repeat CT imaging needs to be done of his abdomen today.  He has no back pain on exam and is neurologically intact.  Low suspicion for neurosurgical emergency.  Pain seems to be localized to the left hip joint.  I am able to fully range the joint without pain.  He has no fever.  Have low suspicion that this is a septic joint.  Likely arthritis, tendinitis.  Pain is worse with walking and when he tried to get up into a truck tonight.  Will give pain medication and reassess.  Workup initiated from triage.  No leukocytosis.  Normal LFTs and lipase.  Urine pending.  X-ray of the left hip reviewed and interpreted by myself and the radiologist and is unremarkable.   MEDICATIONS GIVEN IN ED: Medications  HYDROcodone-acetaminophen (NORCO/VICODIN) 5-325  MG per tablet 1 tablet (has no administration in time range)  ondansetron (ZOFRAN-ODT) disintegrating tablet 4 mg (has no administration in time range)  ibuprofen (ADVIL) tablet 800 mg (800 mg Oral Given 10/24/23 0202)     ED COURSE: Urine shows no sign of infection.  Pain has been well-controlled here.  He did receive 1 dose of ibuprofen here but due to his chronic kidney disease, I recommended they avoid NSAIDs at home.  Start with Vicodin.  Will give orthopedic follow-up.  Patient and family comfortable with this plan.  At this time, I do not feel there is any life-threatening condition present. I reviewed all nursing notes, vitals, pertinent previous records.  All lab and urine results, EKGs, imaging ordered have been independently reviewed and interpreted by myself.  I reviewed all available radiology reports from any imaging ordered this visit.  Based on my assessment, I feel the patient is safe to be discharged home without further emergent workup and can continue workup as an outpatient as needed. Discussed all findings, treatment plan as well as usual and customary return precautions.  They verbalize understanding and are comfortable with this plan.  Outpatient follow-up has been provided as needed.  All questions have been answered.   CONSULTS:  none      OUTSIDE RECORDS REVIEWED: Reviewed last gastroenterology note on 09/21/2023.       FINAL CLINICAL IMPRESSION(S) / ED DIAGNOSES   Final diagnoses:  Left hip pain     Rx / DC Orders   ED Discharge Orders          Ordered    ibuprofen (ADVIL) 800 MG tablet  Every 8 hours PRN,   Status:  Discontinued        10/24/23 0220    HYDROcodone-acetaminophen (NORCO/VICODIN) 5-325 MG tablet  Every 6 hours PRN        10/24/23 0228    ondansetron (ZOFRAN-ODT) 4 MG disintegrating tablet  Every 6 hours PRN        10/24/23 0228  Note:  This document was prepared using Dragon voice recognition software and may  include unintentional dictation errors.   Art Levan, Layla Maw, DO 10/24/23 [phone]

## 2023-10-25 ENCOUNTER — Encounter: Payer: Self-pay | Admitting: Physician Assistant

## 2023-10-25 ENCOUNTER — Ambulatory Visit: Payer: Medicare Other | Admitting: Physician Assistant

## 2023-10-25 VITALS — BP 117/78 | HR 96 | Temp 97.7°F | Ht 74.0 in | Wt 341.8 lb

## 2023-10-25 DIAGNOSIS — K5900 Constipation, unspecified: Secondary | ICD-10-CM | POA: Diagnosis not present

## 2023-10-25 DIAGNOSIS — R1033 Periumbilical pain: Secondary | ICD-10-CM | POA: Diagnosis not present

## 2023-10-25 DIAGNOSIS — Z8711 Personal history of peptic ulcer disease: Secondary | ICD-10-CM | POA: Diagnosis not present

## 2023-10-25 DIAGNOSIS — Z1211 Encounter for screening for malignant neoplasm of colon: Secondary | ICD-10-CM

## 2023-10-25 DIAGNOSIS — K219 Gastro-esophageal reflux disease without esophagitis: Secondary | ICD-10-CM

## 2023-10-25 DIAGNOSIS — R1084 Generalized abdominal pain: Secondary | ICD-10-CM

## 2023-10-25 DIAGNOSIS — K5904 Chronic idiopathic constipation: Secondary | ICD-10-CM

## 2023-10-25 MED ORDER — PEG 3350-KCL-NA BICARB-NACL 420 G PO SOLR: 4000.0000 mL | Freq: Once | ORAL | 0 refills | Status: AC

## 2023-10-25 NOTE — Progress Notes (Signed)
Celso Amy, PA-C [address]  [address]  [place], [place] [address]  Main: [phone]  Fax: [phone]   Primary Care Physician: [name], MD  Primary Gastroenterologist:  Celso Amy, PA-C / Dr. Wyline Mood    CC: Follow-up periumbilical abdominal pain  HPI: Edwin [name] is a 57 y.o. male returns for 1 month follow-up of generalized abdominal pain.  He takes Colace stool softener for constipation and Prilosec 20 Mg daily for GERD.  He lives in a group home.  Here today with his sister and a caregiver from the group home.  He is age 57 and has never had a screening colonoscopy.  Patient's sister states he had an EGD many years ago which showed an ulcer, records unavailable.  He continues to have discomfort at his umbilicus.  No other GI symptoms.  Patient is a poor historian.  09/27/2023 CT abdomen pelvis without contrast: No acute abnormality.  Fatty infiltration of the pancreas.  Labs 08/2023 showed stage III chronic kidney disease with BUN 17, creatinine 1.81, GFR 43.  Followed by nephrology.  Labs 06/2023 showed normal hemoglobin 14.4.  Medical history of type 2 diabetes, CKD, obesity, hyperparathyroidism, bipolar, schizophrenia, sleep apnea.   Current Outpatient Medications  Medication Sig Dispense Refill   Amantadine HCl 100 MG tablet TAKE 1 TABLET BY MOUTH TWICE DAILY 60 tablet 11   Cholecalciferol (VITAMIN D3) 1.25 MG (50000 UT) CAPS Take 1.25 mg by mouth daily. 12 capsule 0   clonazePAM (KLONOPIN) 0.5 MG tablet Take 0.5 mg by mouth at bedtime.     empagliflozin (JARDIANCE) 10 MG TABS tablet Take 1 tablet by mouth daily.     ERGOCALCIFEROL PO Take by mouth.     escitalopram (LEXAPRO) 10 MG tablet Take 10 mg by mouth daily.     HYDROcodone-acetaminophen (NORCO/VICODIN) 5-325 MG tablet Take 1 tablet by mouth every 6 (six) hours as needed. 15 tablet 0   omeprazole (PRILOSEC) 20 MG capsule TAKE (1) CAPSULE BY MOUTH ONCE DAILY. 30 capsule 6   ondansetron  (ZOFRAN-ODT) 4 MG disintegrating tablet Take 1 tablet (4 mg total) by mouth every 6 (six) hours as needed for nausea or vomiting. 20 tablet 0   polyethylene glycol-electrolytes (NULYTELY) 420 g solution Take 4,000 mLs by mouth once for 1 dose. Use as directed for your colonoscopy preparation 4000 mL 0   risperiDONE (RISPERDAL) 3 MG tablet Take 3 mg by mouth daily.     simvastatin (ZOCOR) 20 MG tablet Take 1 tablet (20 mg total) by mouth daily. 30 tablet 6   sitaGLIPtin (JANUVIA) 100 MG tablet Take 1 tablet (100 mg total) by mouth daily. 30 tablet 6   thioridazine (MELLARIL) 100 MG tablet TAKE 1 TABLET BY MOUTH TWICE DAILY 60 tablet 11   No current facility-administered medications for this visit.    Allergies as of 10/25/2023 - Review Complete 10/25/2023  Allergen Reaction Noted   Citrullus vulgaris Hives 04/23/2014    Past Medical History:  Diagnosis Date   Diabetes mellitus without complication (HCC)     History reviewed. No pertinent surgical history.  Review of Systems:    All systems reviewed and negative except where noted in HPI.   Physical Examination:   BP 117/78   Pulse 96   Temp 97.7 F (36.5 C)   Ht 6\' 2"  (1.88 m)   Wt (!) 341 lb 12.8 oz (155 kg)   BMI 43.88 kg/m   General: Well-nourished, well-developed in no acute distress.  Lungs: Clear to auscultation bilaterally. Non-labored. Heart: Regular rate and rhythm, no murmurs rubs or gallops.  Abdomen: Bowel sounds are normal; Abdomen is Soft and obese; No hepatosplenomegaly, masses or hernias;  Mild Umbilical Abdominal Tenderness; rest of abdomen is not tender.  No umbilical hernia.  No guarding or rebound tenderness. Neuro: Alert and oriented x 3.  Grossly intact.  Some cognitive impairment. Psych: Alert and cooperative, normal mood and affect.   Imaging Studies: DG HIP UNILAT WITH PELVIS 2-3 VIEWS LEFT  Result Date: 10/23/2023 CLINICAL DATA:  Acute left hip pain, no known injury, initial encounter EXAM: DG  HIP (WITH OR WITHOUT PELVIS) 3V LEFT COMPARISON:  None Available. FINDINGS: Pelvic ring is intact. Degenerative changes of the hip joints are noted bilaterally. No acute fracture or dislocation is noted. No soft tissue changes are seen. IMPRESSION: Degenerative changes without acute abnormality. Electronically Signed   By: Alcide Clever M.D.   On: 10/23/2023 23:15   CT ABDOMEN PELVIS WO CONTRAST  Result Date: 10/21/2023 CLINICAL DATA:  Abdominal pain near umbilicus and constipation. EXAM: CT ABDOMEN AND PELVIS WITHOUT CONTRAST TECHNIQUE: Multidetector CT imaging of the abdomen and pelvis was performed following the standard protocol without IV contrast. RADIATION DOSE REDUCTION: This exam was performed according to the departmental dose-optimization program which includes automated exposure control, adjustment of the mA and/or kV according to patient size and/or use of iterative reconstruction technique. COMPARISON:  None Available. FINDINGS: Lower chest: No acute abnormality. Hepatobiliary: No focal liver abnormality is seen. No gallstones, gallbladder wall thickening, or biliary dilatation. Pancreas: The pancreas demonstrates fatty infiltration without mass, inflammation or ductal dilatation. Spleen: Normal in size without focal abnormality. Adrenals/Urinary Tract: Adrenal glands are unremarkable. Kidneys are normal, without renal calculi, focal lesion, or hydronephrosis. Bladder is unremarkable. Stomach/Bowel: Bowel shows no evidence of obstruction, ileus, inflammation or lesion. The appendix is normal. No free intraperitoneal air. Vascular/Lymphatic: No significant vascular findings are present. No enlarged abdominal or pelvic lymph nodes. Reproductive: Prostate is unremarkable. Other: No abdominal wall hernia or abnormality. No abdominopelvic ascites. Musculoskeletal: No acute or significant osseous findings. IMPRESSION: 1. No acute findings in the abdomen or pelvis. 2. Fatty infiltration of the pancreas.  Electronically Signed   By: [name] M.D.   On: 10/21/2023 10:10    Assessment and Plan:   Edwin [name] is a 57 y.o. y/o male y.o. y/o male returns for follow-up of:  1.  Generalized abdominal pain (Peri-Umbilical): Recent abdominal pelvic CT showed fatty pancreas, no other abnormality.  Recommend low-fat diet and regular exercise.  2.  Constipation  Continue Colace stool softener  3.  GERD / Remote History of Peptic Ulcer  Continue Prilosec 20 Mg once daily  Scheduling EGD I discussed risks of EGD with patient to include risk of bleeding, perforation, and risk of sedation.  Patient expressed understanding and agrees to proceed with EGD.   4.  Colon cancer screening -no previous colonoscopy  Scheduling Colonoscopy I discussed risks of colonoscopy with patient to include risk of bleeding, colon perforation, and risk of sedation.  Patient expressed understanding and agrees to proceed with colonoscopy.   5.  Fatty Pancreas  Recommend Low fat diet, Exercise and Weight Loss.    Celso Amy, PA-C  Follow up based on the EGD and colonoscopy results.  Also follow-up based on GI symptoms.

## 2023-10-26 ENCOUNTER — Telehealth: Payer: Self-pay | Admitting: Physician Assistant

## 2023-10-26 NOTE — Telephone Encounter (Signed)
Patient sister Laureen Ochs who is on patient DPR states that the patient was taken to the ER Monday night for hip pain and the ER recommended a MRI of hip and a orthopedic referral. She was wondering if this could be order by Celso Amy PA. Informed her no this needed to come from patient PCP they would be one that place the referral and order the MRI if it needed to be done. She states she will call them.

## 2023-10-26 NOTE — Telephone Encounter (Signed)
Patient sister called in she has some questions about her brother result.

## 2023-11-23 ENCOUNTER — Encounter
Admission: RE | Disposition: A | Discharge status: Home / Self Care | Payer: Self-pay | Source: Home / Self Care | Attending: Gastroenterology

## 2023-11-23 ENCOUNTER — Ambulatory Visit: Payer: Medicare Other | Admitting: Certified Registered"

## 2023-11-23 ENCOUNTER — Encounter: Payer: Self-pay | Admitting: Gastroenterology

## 2023-11-23 ENCOUNTER — Ambulatory Visit
Admission: RE | Admit: 2023-11-23 | Discharge: 2023-11-23 | Disposition: A | Discharge status: Home / Self Care | Payer: Medicare Other | Attending: Gastroenterology | Admitting: Gastroenterology

## 2023-11-23 DIAGNOSIS — Z1211 Encounter for screening for malignant neoplasm of colon: Secondary | ICD-10-CM | POA: Diagnosis present

## 2023-11-23 DIAGNOSIS — G4730 Sleep apnea, unspecified: Secondary | ICD-10-CM | POA: Insufficient documentation

## 2023-11-23 DIAGNOSIS — K2970 Gastritis, unspecified, without bleeding: Secondary | ICD-10-CM | POA: Diagnosis not present

## 2023-11-23 DIAGNOSIS — E119 Type 2 diabetes mellitus without complications: Secondary | ICD-10-CM | POA: Insufficient documentation

## 2023-11-23 DIAGNOSIS — K219 Gastro-esophageal reflux disease without esophagitis: Secondary | ICD-10-CM | POA: Diagnosis not present

## 2023-11-23 DIAGNOSIS — K562 Volvulus: Secondary | ICD-10-CM | POA: Diagnosis not present

## 2023-11-23 DIAGNOSIS — R1084 Generalized abdominal pain: Secondary | ICD-10-CM

## 2023-11-23 DIAGNOSIS — Z7984 Long term (current) use of oral hypoglycemic drugs: Secondary | ICD-10-CM | POA: Insufficient documentation

## 2023-11-23 DIAGNOSIS — K317 Polyp of stomach and duodenum: Secondary | ICD-10-CM | POA: Insufficient documentation

## 2023-11-23 HISTORY — DX: Pure hypercholesterolemia, unspecified: E78.00

## 2023-11-23 HISTORY — DX: Other specified health status: Z78.9

## 2023-11-23 HISTORY — PX: POLYPECTOMY: SHX5525

## 2023-11-23 HISTORY — PX: BIOPSY: SHX5522

## 2023-11-23 HISTORY — PX: ESOPHAGOGASTRODUODENOSCOPY (EGD) WITH PROPOFOL: SHX5813

## 2023-11-23 HISTORY — PX: COLONOSCOPY WITH PROPOFOL: SHX5780

## 2023-11-23 LAB — GLUCOSE, CAPILLARY: Glucose-Capillary: 84 mg/dL (ref 70–99)

## 2023-11-23 SURGERY — COLONOSCOPY WITH PROPOFOL
Anesthesia: General

## 2023-11-23 MED ORDER — EPHEDRINE 5 MG/ML INJ
INTRAVENOUS | Status: AC
  Filled 2023-11-23: qty 5

## 2023-11-23 MED ORDER — LIDOCAINE HCL (PF) 2 % IJ SOLN
INTRAMUSCULAR | Status: AC
  Filled 2023-11-23: qty 5

## 2023-11-23 MED ORDER — DEXMEDETOMIDINE HCL IN NACL 80 MCG/20ML IV SOLN
INTRAVENOUS | Status: DC | PRN
  Administered 2023-11-23 (×2): 4 ug via INTRAVENOUS

## 2023-11-23 MED ORDER — GLYCOPYRROLATE 0.2 MG/ML IJ SOLN
INTRAMUSCULAR | Status: AC
  Filled 2023-11-23: qty 1

## 2023-11-23 MED ORDER — GLYCOPYRROLATE 0.2 MG/ML IJ SOLN
INTRAMUSCULAR | Status: DC | PRN
  Administered 2023-11-23: .2 mg via INTRAVENOUS

## 2023-11-23 MED ORDER — PROPOFOL 10 MG/ML IV BOLUS
INTRAVENOUS | Status: DC | PRN
  Administered 2023-11-23: 90 ug/kg/min via INTRAVENOUS
  Administered 2023-11-23: 100 mg via INTRAVENOUS

## 2023-11-23 MED ORDER — PROPOFOL 10 MG/ML IV BOLUS
INTRAVENOUS | Status: AC
  Filled 2023-11-23: qty 40

## 2023-11-23 MED ORDER — PROPOFOL 10 MG/ML IV BOLUS
INTRAVENOUS | Status: AC
  Filled 2023-11-23: qty 20

## 2023-11-23 MED ORDER — LIDOCAINE HCL (CARDIAC) PF 100 MG/5ML IV SOSY
PREFILLED_SYRINGE | INTRAVENOUS | Status: DC | PRN
  Administered 2023-11-23: 100 mg via INTRAVENOUS

## 2023-11-23 MED ORDER — PHENYLEPHRINE 80 MCG/ML (10ML) SYRINGE FOR IV PUSH (FOR BLOOD PRESSURE SUPPORT)
PREFILLED_SYRINGE | INTRAVENOUS | Status: AC
Start: 2023-11-23 — End: ?
  Filled 2023-11-23: qty 10

## 2023-11-23 MED ORDER — SODIUM CHLORIDE 0.9 % IV SOLN
INTRAVENOUS | Status: DC
Start: 2023-11-23 — End: 2023-11-23

## 2023-11-23 MED ORDER — PHENYLEPHRINE 80 MCG/ML (10ML) SYRINGE FOR IV PUSH (FOR BLOOD PRESSURE SUPPORT)
PREFILLED_SYRINGE | INTRAVENOUS | Status: DC | PRN
  Administered 2023-11-23 (×2): 80 ug via INTRAVENOUS

## 2023-11-23 NOTE — Transfer of Care (Signed)
Immediate Anesthesia Transfer of Care Note  Patient: Edwin [name]  Procedure(s) Performed: COLONOSCOPY WITH PROPOFOL ESOPHAGOGASTRODUODENOSCOPY (EGD) WITH PROPOFOL BIOPSY POLYPECTOMY  Patient Location: Endoscopy Unit  Anesthesia Type:General  Level of Consciousness: drowsy  Airway & Oxygen Therapy: Patient Spontanous Breathing and Patient connected to face mask oxygen  Post-op Assessment: Report given to RN and Post -op Vital signs reviewed and stable  Post vital signs: Reviewed and stable  Last Vitals:  Vitals Value Taken Time  BP 90/58 11/23/23 0942  Temp 36.4 C 11/23/23 0940  Pulse 83 11/23/23 0944  Resp 12 11/23/23 0944  SpO2 100 % 11/23/23 0944  Vitals shown include unfiled device data.  Last Pain:  Vitals:   11/23/23 0940  TempSrc: Temporal  PainSc: Asleep         Complications: No notable events documented.

## 2023-11-23 NOTE — Anesthesia Preprocedure Evaluation (Addendum)
Anesthesia Evaluation  Patient identified by MRN, date of birth, ID band Patient awake    Reviewed: Allergy & Precautions, NPO status , Patient's Chart, lab work & pertinent test results  History of Anesthesia Complications Negative for: history of anesthetic complications  Airway Mallampati: III  TM Distance: >3 FB Neck ROM: full    Dental  (+) Chipped   Pulmonary neg shortness of breath, sleep apnea    Pulmonary exam normal        Cardiovascular Exercise Tolerance: Good (-) angina negative cardio ROS Normal cardiovascular exam     Neuro/Psych  PSYCHIATRIC DISORDERS      negative neurological ROS     GI/Hepatic negative GI ROS, Neg liver ROS,,,  Endo/Other  diabetes, Type 2    Renal/GU Renal disease  negative genitourinary   Musculoskeletal   Abdominal   Peds  Hematology negative hematology ROS (+)   Anesthesia Other Findings Past Medical History: No date: Diabetes mellitus without complication (HCC) No date: Hypercholesteremia No date: Medical history non-contributory  Past Surgical History: No date: ESOPHAGOGASTRODUODENOSCOPY (EGD) WITH PROPOFOL     Reproductive/Obstetrics negative OB ROS                             Anesthesia Physical Anesthesia Plan  ASA: 3  Anesthesia Plan: General   Post-op Pain Management:    Induction: Intravenous  PONV Risk Score and Plan: Propofol infusion and TIVA  Airway Management Planned: Natural Airway and Nasal Cannula  Additional Equipment:   Intra-op Plan:   Post-operative Plan:   Informed Consent: I have reviewed the patients History and Physical, chart, labs and discussed the procedure including the risks, benefits and alternatives for the proposed anesthesia with the patient or authorized representative who has indicated his/her understanding and acceptance.     Dental Advisory Given  Plan Discussed with: Anesthesiologist,  CRNA and Surgeon  Anesthesia Plan Comments: (Patient and family consented for risks of anesthesia including but not limited to:  - adverse reactions to medications - risk of airway placement if required - damage to eyes, teeth, lips or other oral mucosa - nerve damage due to positioning  - sore throat or hoarseness - Damage to heart, brain, nerves, lungs, other parts of body or loss of life  They voiced understanding and assent.)       Anesthesia Quick Evaluation

## 2023-11-23 NOTE — Op Note (Signed)
[facility] Patient Name: Edwin [name] Procedure Date: 11/23/2023 8:50 AM MRN: [id number] Account #: [id number] Date of Birth: 10-Mar-1966 Admit Type: Outpatient Age: 57 Room: [facility] ENDO ROOM 2 Gender: Male Note Status: Finalized Instrument Name: Prentice Docker [id number] Procedure:             Colonoscopy Indications:           Screening for colorectal malignant neoplasm Providers:             Wyline Mood MD, MD Referring MD:          [name], MD (Referring MD) Medicines:             Monitored Anesthesia Care Complications:         No immediate complications. Procedure:             Pre-Anesthesia Assessment:                        - Prior to the procedure, a History and Physical was                         performed, and patient medications, allergies and                         sensitivities were reviewed. The patient's tolerance                         of previous anesthesia was reviewed.                        - The risks and benefits of the procedure and the                         sedation options and risks were discussed with the                         patient. All questions were answered and informed                         consent was obtained.                        - ASA Grade Assessment: II - A patient with mild                         systemic disease.                        - ASA Grade Assessment: II - A patient with mild                         systemic disease.                        - After reviewing the risks and benefits, the patient                         was deemed in satisfactory condition to undergo the                         procedure.  After obtaining informed consent, the colonoscope was                         passed under direct vision. Throughout the procedure,                         the patient's blood pressure, pulse, and oxygen                         saturations were monitored  continuously.The patient                         tolerated the procedure fairly well. The quality of                         the bowel preparation was fair. The colonoscopy was                         technically difficult and complex due to significant                         looping. The Colonoscope was introduced through the                         with the intention of advancing to the cecum. The                         scope was advanced to the ascending colon before the                         procedure was aborted. Medications were given. Findings:      The perianal and digital rectal examinations were normal.      A large amount of liquid stool was found in the entire colon,       interfering with visualization. Impression:            - Preparation of the colon was fair.                        - Stool in the entire examined colon.                        - No specimens collected. Recommendation:        - Discharge patient to home (with escort).                        - Resume previous diet.                        - Continue present medications.                        - Repeat colonoscopy in 2 months because the bowel                         preparation was suboptimal. Procedure Code(s):     --- Professional ---                        [phone], 53, Colonoscopy,  flexible; diagnostic,                         including collection of specimen(s) by brushing or                         washing, when performed (separate procedure) Diagnosis Code(s):     --- Professional ---                        Z12.11, Encounter for screening for malignant neoplasm                         of colon CPT copyright 2022 American Medical Association. All rights reserved. The codes documented in this report are preliminary and upon coder review may  be revised to meet current compliance requirements. Wyline Mood, MD Wyline Mood MD, MD 11/23/2023 9:40:16 AM This report has been signed electronically. Number of  Addenda: 0 Note Initiated On: 11/23/2023 8:50 AM Total Procedure Duration: 0 hours 19 minutes 14 seconds  Estimated Blood Loss:  Estimated blood loss: none.      [facility]

## 2023-11-23 NOTE — H&P (Signed)
Wyline Mood, MD [address], [place], [place], [address] [address], [address], [place], [place], [address] Phone: [phone]  Fax: [phone]  Primary Care Physician:  [name], MD   Pre-Procedure History & Physical: HPI:  Edwin [name] is a 57 y.o. male is here for an endoscopy and colonoscopy    Past Medical History:  Diagnosis Date   Diabetes mellitus without complication (HCC)     No past surgical history on file.  Prior to Admission medications   Medication Sig Start Date End Date Taking? Authorizing Provider  Amantadine HCl 100 MG tablet TAKE 1 TABLET BY MOUTH TWICE DAILY 05/06/21   [name], MD  Cholecalciferol (VITAMIN D3) 1.25 MG (50000 UT) CAPS Take 1.25 mg by mouth daily. 08/02/22   [name], MD  clonazePAM (KLONOPIN) 0.5 MG tablet Take 0.5 mg by mouth at bedtime.    [provider]  empagliflozin (JARDIANCE) 10 MG TABS tablet Take 1 tablet by mouth daily. 07/04/23   [provider]  ERGOCALCIFEROL PO Take by mouth.    [provider]  escitalopram (LEXAPRO) 10 MG tablet Take 10 mg by mouth daily. 07/06/17   [provider]  HYDROcodone-acetaminophen (NORCO/VICODIN) 5-325 MG tablet Take 1 tablet by mouth every 6 (six) hours as needed. 10/24/23   Ward, Layla Maw, DO  omeprazole (PRILOSEC) 20 MG capsule TAKE (1) CAPSULE BY MOUTH ONCE DAILY. 09/27/22   [name], MD  ondansetron (ZOFRAN-ODT) 4 MG disintegrating tablet Take 1 tablet (4 mg total) by mouth every 6 (six) hours as needed for nausea or vomiting. 10/24/23   Ward, Layla Maw, DO  risperiDONE (RISPERDAL) 3 MG tablet Take 3 mg by mouth daily. 07/06/17   [provider]  simvastatin (ZOCOR) 20 MG tablet Take 1 tablet (20 mg total) by mouth daily. 09/27/22   [name], MD  sitaGLIPtin (JANUVIA) 100 MG tablet Take 1 tablet (100 mg total) by mouth daily. 09/27/22   [name], MD  thioridazine (MELLARIL) 100 MG tablet TAKE 1 TABLET BY  MOUTH TWICE DAILY 05/06/21   [name], MD    Allergies as of 10/25/2023 - Review Complete 10/25/2023  Allergen Reaction Noted   Citrullus vulgaris Hives 04/23/2014    No family history on file.  Social History   Socioeconomic History   Marital status: Single    Spouse name: Not on file   Number of children: 0   Years of education: Not on file   Highest education level: Not on file  Occupational History   Occupation: Disabled  Tobacco Use   Smoking status: Never   Smokeless tobacco: Never  Vaping Use   Vaping status: Never Used  Substance and Sexual Activity   Alcohol use: No   Drug use: No   Sexual activity: Not Currently  Other Topics Concern   Not on file  Social History Narrative   Not on file   Social Drivers of Health   Financial Resource Strain: Low Risk  (12/01/2021)   Overall Financial Resource Strain (CARDIA)    Difficulty of Paying Living Expenses: Not hard at all  Food Insecurity: No Food Insecurity (12/01/2021)   Hunger Vital Sign    Worried About Running Out of Food in the Last Year: Never true    Ran Out of Food in the Last Year: Never true  Transportation Needs: No Transportation Needs (12/01/2021)   PRAPARE - Administrator, Civil Service (Medical): No  Lack of Transportation (Non-Medical): No  Physical Activity: Insufficiently Active (12/01/2021)   Exercise Vital Sign    Days of Exercise per Week: 3 days    Minutes of Exercise per Session: 10 min  Stress: No Stress Concern Present (12/01/2021)   [employer] of Occupational Health - Occupational Stress Questionnaire    Feeling of Stress : Not at all  Social Connections: Moderately Integrated (12/01/2021)   Social Connection and Isolation Panel [NHANES]    Frequency of Communication with Friends and Family: More than three times a week    Frequency of Social Gatherings with Friends and Family: Once a week    Attends Religious Services: 1 to 4 times per year    Active  Member of [employer] or Organizations: Yes    Attends Engineer, structural: More than 4 times per year    Marital Status: Never married  Intimate Partner Violence: Not At Risk (12/01/2021)   Humiliation, Afraid, Rape, and Kick questionnaire    Fear of Current or Ex-Partner: No    Emotionally Abused: No    Physically Abused: No    Sexually Abused: No    Review of Systems: See HPI, otherwise negative ROS  Physical Exam: There were no vitals taken for this visit. General:   Alert,  pleasant and cooperative in NAD Head:  Normocephalic and atraumatic. Neck:  Supple; no masses or thyromegaly. Lungs:  Clear throughout to auscultation, normal respiratory effort.    Heart:  +S1, +S2, Regular rate and rhythm, No edema. Abdomen:  Soft, nontender and nondistended. Normal bowel sounds, without guarding, and without rebound.   Neurologic:  Alert and  oriented x4;  grossly normal neurologically.  Impression/Plan: Edwin [name] is here for an endoscopy and colonoscopy  to be performed for  evaluation of GERD and colon cancer screening     Risks, benefits, limitations, and alternatives regarding endoscopy have been reviewed with the patient.  Questions have been answered.  All parties agreeable.   Wyline Mood, MD  11/23/2023, 8:07 AM

## 2023-11-23 NOTE — Op Note (Signed)
[facility] Patient Name: Edwin [name] Procedure Date: 11/23/2023 8:51 AM MRN: [id number] Account #: [id number] Date of Birth: [birth year] Admit Type: Outpatient Age: 57 Room: [facility] ENDO ROOM 2 Gender: Male Note Status: Finalized Instrument Name: Upper Endoscope [id number] Procedure:             Upper GI endoscopy Indications:           Follow-up of esophageal reflux Providers:             Wyline Mood MD, MD Referring MD:          [name], MD (Referring MD) Medicines:             Monitored Anesthesia Care Complications:         No immediate complications. Procedure:             Pre-Anesthesia Assessment:                        - Prior to the procedure, a History and Physical was                         performed, and patient medications, allergies and                         sensitivities were reviewed. The patient's tolerance                         of previous anesthesia was reviewed.                        - The risks and benefits of the procedure and the                         sedation options and risks were discussed with the                         patient. All questions were answered and informed                         consent was obtained.                        - ASA Grade Assessment: II - A patient with mild                         systemic disease.                        After obtaining informed consent, the endoscope was                         passed under direct vision. Throughout the procedure,                         the patient's blood pressure, pulse, and oxygen                         saturations were monitored continuously. The Endoscope  was introduced through the mouth, and advanced to the                         third part of duodenum. The upper GI endoscopy was                         accomplished with ease. The patient tolerated the                         procedure well. Findings:      The  examined duodenum was normal.      The esophagus was normal.      Localized moderate inflammation characterized by congestion (edema) and       erythema was found in the gastric antrum. Biopsies were taken with a       cold forceps for histology.      Two 10 to 12 mm semi-pedunculated polyps with no bleeding and no       stigmata of recent bleeding were found in the gastric fundus. The polyp       was removed with a hot snare. Resection and retrieval were complete.      The cardia and gastric fundus were normal on retroflexion. Impression:            - Normal examined duodenum.                        - Normal esophagus.                        - Gastritis. Biopsied.                        - Two gastric polyps. Resected and retrieved. Recommendation:        - Await pathology results.                        - Perform a colonoscopy today. Procedure Code(s):     --- Professional ---                        [phone], Esophagogastroduodenoscopy, flexible,                         transoral; with removal of tumor(s), polyp(s), or                         other lesion(s) by snare technique                        [id number], 59, Esophagogastroduodenoscopy, flexible,                         transoral; with biopsy, single or multiple Diagnosis Code(s):     --- Professional ---                        K29.70, Gastritis, unspecified, without bleeding                        K21.9, Gastro-esophageal reflux disease without  esophagitis                        K31.7, Polyp of stomach and duodenum CPT copyright 2022 American Medical Association. All rights reserved. The codes documented in this report are preliminary and upon coder review may  be revised to meet current compliance requirements. Wyline Mood, MD Wyline Mood MD, MD 11/23/2023 9:15:04 AM This report has been signed electronically. Number of Addenda: 0 Note Initiated On: 11/23/2023 8:51 AM Estimated Blood Loss:  Estimated blood  loss: none.      [facility]

## 2023-11-23 NOTE — Anesthesia Postprocedure Evaluation (Signed)
Anesthesia Post Note  Patient: Edwin [name]  Procedure(s) Performed: COLONOSCOPY WITH PROPOFOL ESOPHAGOGASTRODUODENOSCOPY (EGD) WITH PROPOFOL BIOPSY POLYPECTOMY  Patient location during evaluation: Endoscopy Anesthesia Type: General Level of consciousness: awake and alert Pain management: pain level controlled Vital Signs Assessment: post-procedure vital signs reviewed and stable Respiratory status: spontaneous breathing, nonlabored ventilation, respiratory function stable and patient connected to nasal cannula oxygen Cardiovascular status: blood pressure returned to baseline and stable Postop Assessment: no apparent nausea or vomiting Anesthetic complications: no   No notable events documented.   Last Vitals:  Vitals:   11/23/23 0940 11/23/23 0950  BP: (!) 90/58 91/70  Pulse: 84   Resp: 11   Temp: 36.4 C   SpO2: 96%     Last Pain:  Vitals:   11/23/23 0950  TempSrc:   PainSc: 0-No pain                 Cleda Mccreedy Lionel Woodberry

## 2023-11-24 ENCOUNTER — Encounter: Payer: Self-pay | Admitting: Gastroenterology

## 2023-11-24 LAB — SURGICAL PATHOLOGY

## 2023-11-27 ENCOUNTER — Encounter: Payer: Self-pay | Admitting: Gastroenterology

## 2023-12-27 ENCOUNTER — Ambulatory Visit: Payer: Medicare Other | Admitting: Urology

## 2024-02-07 ENCOUNTER — Encounter: Payer: Self-pay | Admitting: Urology

## 2024-02-07 ENCOUNTER — Ambulatory Visit: Payer: Medicare Other | Admitting: Urology

## 2024-02-07 VITALS — BP 130/85 | HR 94 | Ht 73.0 in | Wt 305.9 lb

## 2024-02-07 DIAGNOSIS — N3941 Urge incontinence: Secondary | ICD-10-CM

## 2024-02-07 DIAGNOSIS — N401 Enlarged prostate with lower urinary tract symptoms: Secondary | ICD-10-CM | POA: Diagnosis not present

## 2024-02-07 LAB — BLADDER SCAN AMB NON-IMAGING: Scan Result: 11

## 2024-02-07 MED ORDER — TAMSULOSIN HCL 0.4 MG PO CAPS: 0.4000 mg | ORAL_CAPSULE | Freq: Every day | ORAL | 3 refills | Status: DC

## 2024-02-07 NOTE — Progress Notes (Signed)
 I, Edwin [name], acting as a scribe for Edwin Altes, MD., have documented all relevant documentation on the behalf of Edwin Altes, MD, as directed by Edwin Altes, MD while in the presence of Edwin Altes, MD.  02/07/2024 11:15 AM   [name] 1965-12-08 [id number]  Referring provider: [name], MD [address] [place],  [place] [address]  Chief Complaint  Patient presents with   Benign Prostatic Hypertrophy   Urologic history: 1. BPH with LUTS Tamsulosin  HPI: Edwin [name] is a 58 y.o. male presents for annual follow-up. He presents today with his caregiver.  No significant change in his voiding pattern, and still with storage-related voiding symptoms.  Remains on Tamsulosin No dysuria, gross hematuria No flank, abdominal or pelvic pain   PMH: Past Medical History:  Diagnosis Date   Diabetes mellitus without complication (HCC)    Hypercholesteremia    Medical history non-contributory     Surgical History: Past Surgical History:  Procedure Laterality Date   BIOPSY  11/23/2023   Procedure: BIOPSY;  Surgeon: Wyline Mood, MD;  Location: [facility] ENDOSCOPY;  Service: Gastroenterology;;   COLONOSCOPY WITH PROPOFOL N/A 11/23/2023   Procedure: COLONOSCOPY WITH PROPOFOL;  Surgeon: Wyline Mood, MD;  Location: [facility] ENDOSCOPY;  Service: Gastroenterology;  Laterality: N/A;   ESOPHAGOGASTRODUODENOSCOPY (EGD) WITH PROPOFOL     ESOPHAGOGASTRODUODENOSCOPY (EGD) WITH PROPOFOL N/A 11/23/2023   Procedure: ESOPHAGOGASTRODUODENOSCOPY (EGD) WITH PROPOFOL;  Surgeon: Wyline Mood, MD;  Location: [facility] ENDOSCOPY;  Service: Gastroenterology;  Laterality: N/A;   POLYPECTOMY  11/23/2023   Procedure: POLYPECTOMY;  Surgeon: Wyline Mood, MD;  Location: [facility] ENDOSCOPY;  Service: Gastroenterology;;    Home Medications:  Allergies as of 02/07/2024       Reactions   Citrullus Vulgaris Hives        Medication List        Accurate as of February 07, 2024 11:15 AM. If you  have any questions, ask your nurse or doctor.          Amantadine HCl 100 MG tablet TAKE 1 TABLET BY MOUTH TWICE DAILY   clonazePAM 0.5 MG tablet Commonly known as: KLONOPIN Take 0.5 mg by mouth at bedtime.   empagliflozin 10 MG Tabs tablet Commonly known as: JARDIANCE Take 1 tablet by mouth daily.   ERGOCALCIFEROL PO Take by mouth.   escitalopram 10 MG tablet Commonly known as: LEXAPRO Take 10 mg by mouth daily.   HYDROcodone-acetaminophen 5-325 MG tablet Commonly known as: NORCO/VICODIN Take 1 tablet by mouth every 6 (six) hours as needed.   omeprazole 20 MG capsule Commonly known as: PRILOSEC TAKE (1) CAPSULE BY MOUTH ONCE DAILY.   ondansetron 4 MG disintegrating tablet Commonly known as: ZOFRAN-ODT Take 1 tablet (4 mg total) by mouth every 6 (six) hours as needed for nausea or vomiting.   risperiDONE 3 MG tablet Commonly known as: RISPERDAL Take 3 mg by mouth daily.   simvastatin 20 MG tablet Commonly known as: ZOCOR Take 1 tablet (20 mg total) by mouth daily.   sitaGLIPtin 100 MG tablet Commonly known as: Januvia Take 1 tablet (100 mg total) by mouth daily.   tamsulosin 0.4 MG Caps capsule Commonly known as: FLOMAX Take 1 capsule (0.4 mg total) by mouth daily. Started by: Edwin [name]   thioridazine 100 MG tablet Commonly known as: MELLARIL TAKE 1 TABLET BY MOUTH TWICE DAILY   Vitamin D3 1.25 MG (50000 UT) Caps Take 1.25 mg by mouth daily.  Allergies:  Allergies  Allergen Reactions   Citrullus Vulgaris Hives   Social History:  reports that he has never smoked. He has never used smokeless tobacco. He reports that he does not drink alcohol and does not use drugs.   Physical Exam: BP 130/85   Pulse 94   Ht 6\' 1"  (1.854 m)   Wt (!) 305 lb 14.4 oz (138.8 kg)   BMI 40.36 kg/m   Constitutional:  Alert and oriented, No acute distress. HEENT: [place] AT Respiratory: Normal respiratory effort, no increased work of  breathing. Psychiatric: Normal mood and affect.  Assessment & Plan:    1.  BPH with LUTS PVR 11 mL  2. Urgency with urge incontinence Has failed beta 3 agonist x 2 and trospium Discussed PTNS and gave his caregiver a brochure to discuss with his guardian if interested in pursuing  Continuing annual follow-up.  I have reviewed the above documentation for accuracy and completeness, and I agree with the above.   Edwin Altes, MD  [facility] [address] [place], [place] [address] [phone]

## 2024-02-26 ENCOUNTER — Telehealth: Payer: Self-pay

## 2024-02-26 NOTE — Telephone Encounter (Signed)
 Spoke with Lyda Jester- patient;s caregiver- he stated he needed to look at his calendar and he would call back to schedule the Colonoscopy for the patient- may need 2 day prep due to patient not being clean.

## 2024-03-06 ENCOUNTER — Other Ambulatory Visit: Payer: Self-pay

## 2024-03-06 ENCOUNTER — Telehealth: Payer: Self-pay

## 2024-03-06 DIAGNOSIS — K219 Gastro-esophageal reflux disease without esophagitis: Secondary | ICD-10-CM

## 2024-03-06 DIAGNOSIS — Z1211 Encounter for screening for malignant neoplasm of colon: Secondary | ICD-10-CM

## 2024-03-06 MED ORDER — PEG 3350-KCL-NA BICARB-NACL 420 G PO SOLR
4000.0000 mL | Freq: Once | ORAL | 0 refills | Status: AC
Start: 2024-03-06 — End: 2024-03-06

## 2024-03-06 NOTE — Telephone Encounter (Signed)
 Spoke with Edwin [name] at the facility and scheduled colonoscopy and EGD 04/17/24-golytely sent to pharmacy and mailed instructions to the below address.   [address] [place] [address]

## 2024-03-11 ENCOUNTER — Encounter: Payer: Self-pay | Admitting: Gastroenterology

## 2024-03-19 ENCOUNTER — Telehealth: Payer: Self-pay

## 2024-03-19 NOTE — Telephone Encounter (Signed)
 [name] from endoscopy unit called stating that you had told this patient's caregiver-[name] that patient's colonoscopy would be on 04/18/2024 but yet you had scheduled it for today. [name] wants to know if Edwin [name] is still for May 15th. However, I did not see anything scheduled for that day. Can you please give [name] a call and clarify on the date and then send an order for the procedure. Thank you.

## 2024-04-09 ENCOUNTER — Ambulatory Visit (INDEPENDENT_AMBULATORY_CARE_PROVIDER_SITE_OTHER): Admitting: Nurse Practitioner

## 2024-04-09 ENCOUNTER — Encounter: Payer: Self-pay | Admitting: Nurse Practitioner

## 2024-04-09 VITALS — BP 132/88 | HR 68 | Temp 98.2°F | Resp 16 | Ht 73.0 in | Wt 312.0 lb

## 2024-04-09 DIAGNOSIS — N1831 Chronic kidney disease, stage 3a: Secondary | ICD-10-CM

## 2024-04-09 DIAGNOSIS — E785 Hyperlipidemia, unspecified: Secondary | ICD-10-CM

## 2024-04-09 DIAGNOSIS — Z6841 Body Mass Index (BMI) 40.0 and over, adult: Secondary | ICD-10-CM

## 2024-04-09 DIAGNOSIS — M150 Primary generalized (osteo)arthritis: Secondary | ICD-10-CM | POA: Diagnosis not present

## 2024-04-09 DIAGNOSIS — N1832 Chronic kidney disease, stage 3b: Secondary | ICD-10-CM | POA: Diagnosis not present

## 2024-04-09 DIAGNOSIS — E782 Mixed hyperlipidemia: Secondary | ICD-10-CM

## 2024-04-09 DIAGNOSIS — E1122 Type 2 diabetes mellitus with diabetic chronic kidney disease: Secondary | ICD-10-CM

## 2024-04-09 DIAGNOSIS — Z79899 Other long term (current) drug therapy: Secondary | ICD-10-CM

## 2024-04-09 DIAGNOSIS — E559 Vitamin D deficiency, unspecified: Secondary | ICD-10-CM

## 2024-04-09 DIAGNOSIS — E1169 Type 2 diabetes mellitus with other specified complication: Secondary | ICD-10-CM | POA: Diagnosis not present

## 2024-04-09 DIAGNOSIS — E66813 Obesity, class 3: Secondary | ICD-10-CM

## 2024-04-09 DIAGNOSIS — F250 Schizoaffective disorder, bipolar type: Secondary | ICD-10-CM

## 2024-04-09 MED ORDER — EMPAGLIFLOZIN 10 MG PO TABS: 10.0000 mg | ORAL_TABLET | Freq: Every day | ORAL | 11 refills | Status: AC

## 2024-04-09 MED ORDER — AMANTADINE HCL 100 MG PO TABS: 100.0000 mg | ORAL_TABLET | Freq: Two times a day (BID) | ORAL | 11 refills | Status: AC

## 2024-04-09 MED ORDER — VITAMIN D (ERGOCALCIFEROL) 1.25 MG (50000 UNIT) PO CAPS: 50000.0000 [IU] | ORAL_CAPSULE | ORAL | 1 refills | Status: DC

## 2024-04-09 MED ORDER — SIMVASTATIN 20 MG PO TABS: 20.0000 mg | ORAL_TABLET | Freq: Every day | ORAL | 6 refills | Status: DC

## 2024-04-09 MED ORDER — MELOXICAM 15 MG PO TABS: 15.0000 mg | ORAL_TABLET | Freq: Every day | ORAL | 1 refills | Status: DC | PRN

## 2024-04-09 MED ORDER — OMEPRAZOLE 20 MG PO CPDR: DELAYED_RELEASE_CAPSULE | ORAL | 6 refills | Status: DC

## 2024-04-09 MED ORDER — SIMETHICONE 80 MG PO CHEW: 80.0000 mg | CHEWABLE_TABLET | Freq: Four times a day (QID) | ORAL | 0 refills | Status: DC | PRN

## 2024-04-09 MED ORDER — DOCUSATE SODIUM 100 MG PO CAPS: 100.0000 mg | ORAL_CAPSULE | Freq: Three times a day (TID) | ORAL | 11 refills | Status: AC

## 2024-04-09 NOTE — Progress Notes (Signed)
 [facility] [address] [place], [place] [address]  Internal MEDICINE  Office Visit Note  Patient Name: Edwin [name]  [id number]  [id number]  Date of Service: 04/09/2024   Complaints/HPI Pt is here for establishment of PCP. Chief Complaint  Patient presents with   New Patient (Initial Visit)    Stomach pain-naval pain    HPI Edras presents for a new patient visit to establish care.  Accompanied by [name] from the group home and his sister, Rich Champ.  Well-appearing 58 y.o. male with OSA, GERD, diabetes, obesity, high cholesterol, and schizoaffective disorder.  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  Eye exam and/or foot exam: Labs: due for labs and due for annual wellness visit.  New or worsening pain: left leg and left hip pain sometimes, most likely arthritis Has abdominal pains sometimes as well.    Current Medication: Outpatient Encounter Medications as of 04/09/2024  Medication Sig   Cholecalciferol (VITAMIN D3) 1.25 MG (50000 UT) CAPS Take 1.25 mg by mouth daily.   clonazePAM (KLONOPIN) 0.5 MG tablet Take 0.5 mg by mouth at bedtime.   docusate sodium  (COLACE) 100 MG capsule Take 1 capsule (100 mg total) by mouth 3 (three) times daily.   escitalopram (LEXAPRO) 10 MG tablet Take 10 mg by mouth daily.   HYDROcodone -acetaminophen  (NORCO/VICODIN) 5-325 MG tablet Take 1 tablet by mouth every 6 (six) hours as needed.   meloxicam  (MOBIC ) 15 MG tablet Take 1 tablet (15 mg total) by mouth daily as needed for pain (arthritis pain/flare). Take with food   polyethylene glycol-electrolytes (NULYTELY) 420 g solution Take by mouth.   risperiDONE (RISPERDAL) 1 MG tablet Take 1 mg by mouth daily as needed (severe agitation).   risperiDONE (RISPERDAL) 3 MG tablet  Take 3 mg by mouth daily.   simethicone  (MYLICON) 80 MG chewable tablet Chew 1 tablet (80 mg total) by mouth every 6 (six) hours as needed for flatulence.   tamsulosin  (FLOMAX ) 0.4 MG CAPS capsule Take 1 capsule (0.4 mg total) by mouth daily.   thioridazine (MELLARIL) 100 MG tablet TAKE 1 TABLET BY MOUTH TWICE DAILY   Vitamin D , Ergocalciferol , (DRISDOL ) 1.25 MG (50000 UNIT) CAPS capsule Take 1 capsule (50,000 Units total) by mouth every 7 (seven) days.   [DISCONTINUED] Amantadine  HCl 100 MG tablet TAKE 1 TABLET BY MOUTH TWICE DAILY   [DISCONTINUED] empagliflozin  (JARDIANCE ) 10 MG TABS tablet Take 1 tablet by mouth daily.   [DISCONTINUED] ERGOCALCIFEROL  PO Take by mouth.   [DISCONTINUED] omeprazole  (PRILOSEC) 20 MG capsule TAKE (1) CAPSULE BY MOUTH ONCE DAILY.   [DISCONTINUED] ondansetron  (ZOFRAN -ODT) 4 MG disintegrating tablet Take 1 tablet (4 mg total) by mouth every 6 (six) hours as needed for nausea or vomiting.   [DISCONTINUED] simvastatin  (ZOCOR ) 20 MG tablet Take 1 tablet (20 mg total) by mouth daily.   [DISCONTINUED] sitaGLIPtin  (JANUVIA ) 100 MG tablet Take 1 tablet (100 mg total) by mouth daily.   Amantadine  HCl 100 MG tablet Take 1 tablet (100 mg total) by mouth 2 (two) times daily.   empagliflozin  (JARDIANCE ) 10 MG TABS tablet Take 1 tablet (10 mg total) by mouth daily.   omeprazole  (PRILOSEC) 20 MG capsule TAKE (1) CAPSULE BY MOUTH ONCE DAILY.   simvastatin  (ZOCOR ) 20 MG tablet Take 1 tablet (20 mg  total) by mouth daily.   No facility-administered encounter medications on file as of 04/09/2024.    Surgical History: Past Surgical History:  Procedure Laterality Date   BIOPSY  11/23/2023   Procedure: BIOPSY;  Surgeon: Luke Salaam, MD;  Location: [facility] ENDOSCOPY;  Service: Gastroenterology;;   COLONOSCOPY WITH PROPOFOL  N/A 11/23/2023   Procedure: COLONOSCOPY WITH PROPOFOL ;  Surgeon: Luke Salaam, MD;  Location: [facility] ENDOSCOPY;  Service: Gastroenterology;  Laterality: N/A;    ESOPHAGOGASTRODUODENOSCOPY (EGD) WITH PROPOFOL      ESOPHAGOGASTRODUODENOSCOPY (EGD) WITH PROPOFOL  N/A 11/23/2023   Procedure: ESOPHAGOGASTRODUODENOSCOPY (EGD) WITH PROPOFOL ;  Surgeon: Luke Salaam, MD;  Location: [facility] ENDOSCOPY;  Service: Gastroenterology;  Laterality: N/A;   POLYPECTOMY  11/23/2023   Procedure: POLYPECTOMY;  Surgeon: Luke Salaam, MD;  Location: [facility] ENDOSCOPY;  Service: Gastroenterology;;    Medical History: Past Medical History:  Diagnosis Date   Diabetes mellitus without complication (HCC)    Hypercholesteremia    Medical history non-contributory     Family History: History reviewed. No pertinent family history.  Social History   Socioeconomic History   Marital status: Single    Spouse name: Not on file   Number of children: 0   Years of education: Not on file   Highest education level: Not on file  Occupational History   Occupation: Disabled  Tobacco Use   Smoking status: Never   Smokeless tobacco: Never  Vaping Use   Vaping status: Never Used  Substance and Sexual Activity   Alcohol use: No   Drug use: No   Sexual activity: Not Currently  Other Topics Concern   Not on file  Social History Narrative   Not on file   Social Drivers of Health   Financial Resource Strain: Low Risk  (12/01/2021)   Overall Financial Resource Strain (CARDIA)    Difficulty of Paying Living Expenses: Not hard at all  Food Insecurity: No Food Insecurity (12/01/2021)   Hunger Vital Sign    Worried About Running Out of Food in the Last Year: Never true    Ran Out of Food in the Last Year: Never true  Transportation Needs: No Transportation Needs (12/01/2021)   PRAPARE - Administrator, Civil Service (Medical): No    Lack of Transportation (Non-Medical): No  Physical Activity: Insufficiently Active (12/01/2021)   Exercise Vital Sign    Days of Exercise per Week: 3 days    Minutes of Exercise per Session: 10 min  Stress: No Stress Concern Present  (12/01/2021)   [employer] of Occupational Health - Occupational Stress Questionnaire    Feeling of Stress : Not at all  Social Connections: Moderately Integrated (12/01/2021)   Social Connection and Isolation Panel [NHANES]    Frequency of Communication with Friends and Family: More than three times a week    Frequency of Social Gatherings with Friends and Family: Once a week    Attends Religious Services: 1 to 4 times per year    Active Member of [employer] or Organizations: Yes    Attends Engineer, structural: More than 4 times per year    Marital Status: Never married  Intimate Partner Violence: Not At Risk (12/01/2021)   Humiliation, Afraid, Rape, and Kick questionnaire    Fear of Current or Ex-Partner: No    Emotionally Abused: No    Physically Abused: No    Sexually Abused: No     Review of Systems  Constitutional:  Negative for chills, fatigue and unexpected weight change.  HENT:  Negative  for congestion, postnasal drip, rhinorrhea, sneezing and sore throat.   Eyes:  Negative for redness.  Respiratory:  Negative for cough, chest tightness, shortness of breath and wheezing.   Cardiovascular: Negative.  Negative for chest pain and palpitations.  Gastrointestinal:  Negative for abdominal pain, constipation, diarrhea, nausea and vomiting.  Genitourinary:  Negative for dysuria and frequency.  Musculoskeletal:  Positive for arthralgias. Negative for back pain, joint swelling and neck pain.  Skin:  Negative for rash.  Neurological: Negative.  Negative for tremors and numbness.  Hematological:  Negative for adenopathy. Does not bruise/bleed easily.  Psychiatric/Behavioral:  Positive for behavioral problems (Depression). Negative for self-injury, sleep disturbance and suicidal ideas. The patient is nervous/anxious.     Vital Signs: BP 132/88   Pulse 68   Temp 98.2 F (36.8 C)   Resp 16   Ht 6\' 1"  (1.854 m)   Wt (!) 312 lb (141.5 kg)   SpO2 98%   BMI 41.16 kg/m     Physical Exam Vitals reviewed.  Constitutional:      General: He is not in acute distress.    Appearance: Normal appearance. He is obese. He is not ill-appearing.  HENT:     Head: Normocephalic and atraumatic.  Eyes:     Pupils: Pupils are equal, round, and reactive to light.  Cardiovascular:     Rate and Rhythm: Normal rate and regular rhythm.     Heart sounds: Normal heart sounds. No murmur heard. Pulmonary:     Effort: Pulmonary effort is normal. No respiratory distress.     Breath sounds: Normal breath sounds.  Skin:    General: Skin is warm and dry.     Capillary Refill: Capillary refill takes less than 2 seconds.  Neurological:     Mental Status: He is alert. Mental status is at baseline.  Psychiatric:        Mood and Affect: Mood normal.        Behavior: Behavior normal.       Assessment/Plan: 1. Type 2 diabetes mellitus with stage 3b chronic kidney disease, without long-term current use of insulin (HCC) (Primary) Continue jardiance  as prescribed. Routine labs ordered.  - empagliflozin  (JARDIANCE ) 10 MG TABS tablet; Take 1 tablet (10 mg total) by mouth daily.  Dispense: 30 tablet; Refill: 11 - CBC with Differential/Platelet - CMP14+EGFR - Lipid Profile - Hgb A1C w/o eAG - Vitamin D  (25 hydroxy)  2. Stage 3b chronic kidney disease (HCC) Routine labs ordered  - CBC with Differential/Platelet - CMP14+EGFR - Lipid Profile - Hgb A1C w/o eAG - Vitamin D  (25 hydroxy)  3. Hyperlipidemia associated with type 2 diabetes mellitus (HCC) Continue simvastatin  as prescribed. Routine labs ordered  - simvastatin  (ZOCOR ) 20 MG tablet; Take 1 tablet (20 mg total) by mouth daily.  Dispense: 30 tablet; Refill: 6 - CBC with Differential/Platelet - CMP14+EGFR - Lipid Profile - Hgb A1C w/o eAG - Vitamin D  (25 hydroxy)  4. Primary osteoarthritis involving multiple joints Meloxicam  prescribed for arthritis pain  - meloxicam  (MOBIC ) 15 MG tablet; Take 1 tablet (15 mg total)  by mouth daily as needed for pain (arthritis pain/flare). Take with food  Dispense: 30 tablet; Refill: 1  5. Class 3 severe obesity with serious comorbidity and body mass index (BMI) of 40.0 to 44.9 in adult Continue to monitor weight, encouraged walking and eating diabetic diet.   6. Vitamin D  deficiency Continue vitamin D  as prescribed, routine lab ordered  - Vitamin D , Ergocalciferol , (DRISDOL ) 1.25 MG (50000  UNIT) CAPS capsule; Take 1 capsule (50,000 Units total) by mouth every 7 (seven) days.  Dispense: 12 capsule; Refill: 1 - Vitamin D  (25 hydroxy)  7. Encounter for medication review Medication list reviewed, updated and refills ordered  - risperiDONE (RISPERDAL) 1 MG tablet; Take 1 mg by mouth daily as needed (severe agitation). - Amantadine  HCl 100 MG tablet; Take 1 tablet (100 mg total) by mouth 2 (two) times daily.  Dispense: 60 tablet; Refill: 11 - omeprazole  (PRILOSEC) 20 MG capsule; TAKE (1) CAPSULE BY MOUTH ONCE DAILY.  Dispense: 30 capsule; Refill: 6 - simethicone  (MYLICON) 80 MG chewable tablet; Chew 1 tablet (80 mg total) by mouth every 6 (six) hours as needed for flatulence.  Dispense: 30 tablet; Refill: 0 - docusate sodium  (COLACE) 100 MG capsule; Take 1 capsule (100 mg total) by mouth 3 (three) times daily.  Dispense: 90 capsule; Refill: 11  8. Schizoaffective disorder, bipolar type [facility]) Sees psychiatry    General Counseling: brennon otterness understanding of the findings of todays visit and agrees with plan of treatment. I have discussed any further diagnostic evaluation that may be needed or ordered today. We also reviewed his medications today. he has been encouraged to call the office with any questions or concerns that should arise related to todays visit.    Orders Placed This Encounter  Procedures   CBC with Differential/Platelet   CMP14+EGFR   Lipid Profile   Hgb A1C w/o eAG   Vitamin D  (25 hydroxy)    Meds ordered this encounter  Medications    Amantadine  HCl 100 MG tablet    Sig: Take 1 tablet (100 mg total) by mouth 2 (two) times daily.    Dispense:  60 tablet    Refill:  11   omeprazole  (PRILOSEC) 20 MG capsule    Sig: TAKE (1) CAPSULE BY MOUTH ONCE DAILY.    Dispense:  30 capsule    Refill:  6   empagliflozin  (JARDIANCE ) 10 MG TABS tablet    Sig: Take 1 tablet (10 mg total) by mouth daily.    Dispense:  30 tablet    Refill:  11   simvastatin  (ZOCOR ) 20 MG tablet    Sig: Take 1 tablet (20 mg total) by mouth daily.    Dispense:  30 tablet    Refill:  6   Vitamin D , Ergocalciferol , (DRISDOL ) 1.25 MG (50000 UNIT) CAPS capsule    Sig: Take 1 capsule (50,000 Units total) by mouth every 7 (seven) days.    Dispense:  12 capsule    Refill:  1   simethicone  (MYLICON) 80 MG chewable tablet    Sig: Chew 1 tablet (80 mg total) by mouth every 6 (six) hours as needed for flatulence.    Dispense:  30 tablet    Refill:  0   docusate sodium  (COLACE) 100 MG capsule    Sig: Take 1 capsule (100 mg total) by mouth 3 (three) times daily.    Dispense:  90 capsule    Refill:  11   meloxicam  (MOBIC ) 15 MG tablet    Sig: Take 1 tablet (15 mg total) by mouth daily as needed for pain (arthritis pain/flare). Take with food    Dispense:  30 tablet    Refill:  1    Fill new script today    Return for AWV, Demontray Franta PCP at earliest opening available and have labs done prior to office visit. .  Time spent:30 Minutes Time spent with patient included reviewing progress notes,  labs, imaging studies, and discussing plan for follow up.   [place] Controlled Substance Database was reviewed by me for overdose risk score (ORS)   This patient was seen by Laurence Pons, FNP-C in collaboration with Dr. Verneta Gone as a part of collaborative care agreement.   Carlita Whitcomb R. Bobbi Burow, MSN, FNP-C Internal Medicine

## 2024-04-10 ENCOUNTER — Encounter (HOSPITAL_COMMUNITY): Payer: Self-pay

## 2024-04-16 ENCOUNTER — Encounter: Payer: Self-pay | Admitting: Gastroenterology

## 2024-04-17 ENCOUNTER — Encounter
Admission: RE | Disposition: A | Discharge status: Home / Self Care | Payer: Self-pay | Source: Home / Self Care | Attending: Gastroenterology

## 2024-04-17 ENCOUNTER — Encounter: Payer: Self-pay | Admitting: Anesthesiology

## 2024-04-17 ENCOUNTER — Ambulatory Visit
Admission: RE | Admit: 2024-04-17 | Discharge: 2024-04-17 | Disposition: A | Discharge status: Home / Self Care | Attending: Gastroenterology | Admitting: Gastroenterology

## 2024-04-17 DIAGNOSIS — D125 Benign neoplasm of sigmoid colon: Secondary | ICD-10-CM | POA: Insufficient documentation

## 2024-04-17 DIAGNOSIS — Z1211 Encounter for screening for malignant neoplasm of colon: Secondary | ICD-10-CM | POA: Diagnosis not present

## 2024-04-17 DIAGNOSIS — D126 Benign neoplasm of colon, unspecified: Secondary | ICD-10-CM

## 2024-04-17 DIAGNOSIS — K219 Gastro-esophageal reflux disease without esophagitis: Secondary | ICD-10-CM

## 2024-04-17 HISTORY — PX: POLYPECTOMY: SHX149

## 2024-04-17 HISTORY — PX: COLONOSCOPY: SHX5424

## 2024-04-17 LAB — GLUCOSE, CAPILLARY: Glucose-Capillary: 99 mg/dL (ref 70–99)

## 2024-04-17 SURGERY — COLONOSCOPY
Anesthesia: General

## 2024-04-17 MED ORDER — LIDOCAINE HCL (PF) 2 % IJ SOLN
INTRAMUSCULAR | Status: AC
  Filled 2024-04-17: qty 5

## 2024-04-17 MED ORDER — LIDOCAINE HCL (CARDIAC) PF 100 MG/5ML IV SOSY
PREFILLED_SYRINGE | INTRAVENOUS | Status: DC | PRN
  Administered 2024-04-17: 100 mg via INTRAVENOUS

## 2024-04-17 MED ORDER — SODIUM CHLORIDE 0.9 % IV SOLN
INTRAVENOUS | Status: DC
  Administered 2024-04-17: 20 mL/h via INTRAVENOUS

## 2024-04-17 MED ORDER — DEXMEDETOMIDINE HCL IN NACL 80 MCG/20ML IV SOLN
INTRAVENOUS | Status: DC | PRN
  Administered 2024-04-17: 20 ug via INTRAVENOUS

## 2024-04-17 MED ORDER — PROPOFOL 10 MG/ML IV BOLUS
INTRAVENOUS | Status: DC | PRN
  Administered 2024-04-17: 50 mg via INTRAVENOUS

## 2024-04-17 MED ORDER — DEXMEDETOMIDINE HCL IN NACL 80 MCG/20ML IV SOLN
INTRAVENOUS | Status: AC
  Filled 2024-04-17: qty 20

## 2024-04-17 MED ORDER — PROPOFOL 500 MG/50ML IV EMUL
INTRAVENOUS | Status: DC | PRN
  Administered 2024-04-17: 50 ug/kg/min via INTRAVENOUS

## 2024-04-17 NOTE — Transfer of Care (Signed)
 Immediate Anesthesia Transfer of Care Note  Patient: Edwin [name]  Procedure(s) Performed: COLONOSCOPY POLYPECTOMY, INTESTINE  Patient Location: PACU  Anesthesia Type:General  Level of Consciousness: sedated  Airway & Oxygen Therapy: Patient Spontanous Breathing  Post-op Assessment: Report given to RN and Post -op Vital signs reviewed and stable  Post vital signs: Reviewed and stable  Last Vitals:  Vitals Value Taken Time  BP    Temp    Pulse 70 04/17/24 1037  Resp 19 04/17/24 1037  SpO2 99 % 04/17/24 1037  Vitals shown include unfiled device data.  Last Pain:  Vitals:   04/17/24 0924  TempSrc: Temporal  PainSc: 0-No pain         Complications: No notable events documented.

## 2024-04-17 NOTE — Anesthesia Preprocedure Evaluation (Signed)
 Anesthesia Evaluation  Patient identified by MRN, date of birth, ID band Patient awake    Reviewed: Allergy & Precautions, NPO status , Patient's Chart, lab work & pertinent test results  History of Anesthesia Complications Negative for: history of anesthetic complications  Airway Mallampati: I   Neck ROM: Full    Dental no notable dental hx.    Pulmonary sleep apnea    Pulmonary exam normal breath sounds clear to auscultation       Cardiovascular negative cardio ROS Normal cardiovascular exam Rhythm:Regular Rate:Normal     Neuro/Psych  PSYCHIATRIC DISORDERS (schizoaffective disorder)      negative neurological ROS     GI/Hepatic negative GI ROS,,,  Endo/Other  diabetes, Type 2  Obesity   Renal/GU Renal disease (stage III CKD)   BPH    Musculoskeletal   Abdominal   Peds  Hematology negative hematology ROS (+)   Anesthesia Other Findings   Reproductive/Obstetrics                             Anesthesia Physical Anesthesia Plan  ASA: 2  Anesthesia Plan: General   Post-op Pain Management:    Induction: Intravenous  PONV Risk Score and Plan: 2 and Propofol  infusion, TIVA and Treatment may vary due to age or medical condition  Airway Management Planned: Natural Airway  Additional Equipment:   Intra-op Plan:   Post-operative Plan:   Informed Consent: I have reviewed the patients History and Physical, chart, labs and discussed the procedure including the risks, benefits and alternatives for the proposed anesthesia with the patient or authorized representative who has indicated his/her understanding and acceptance.     Consent reviewed with POA  Plan Discussed with: CRNA  Anesthesia Plan Comments: (History and consent obtained from patient's sister and legal guardian, Rich Champ, at bedside. LMA/GETA backup discussed.  Patient's sister consented for risks of anesthesia including  but not limited to:  - adverse reactions to medications - damage to eyes, teeth, lips or other oral mucosa - nerve damage due to positioning  - sore throat or hoarseness - damage to heart, brain, nerves, lungs, other parts of body or loss of life  Informed patient's sister about role of CRNA in peri- and intra-operative care; she voiced understanding.)       Anesthesia Quick Evaluation

## 2024-04-17 NOTE — Op Note (Signed)
 [facility] Gastroenterology Patient Name: Bari Doorley Procedure Date: 04/17/2024 9:59 AM MRN: [id number] Account #: [id number] Date of Birth: February 25, 1966 Admit Type: Outpatient Age: 58 Room: [facility] ENDO ROOM 3 Gender: Male Note Status: Finalized Instrument Name: Charlyn Cooley [id number] Procedure:             Colonoscopy Indications:           Screening for colorectal malignant neoplasm Providers:             Luke Salaam MD, MD Referring MD:          Laurence Pons (Referring MD) Medicines:             Monitored Anesthesia Care Complications:         No immediate complications. Procedure:             Pre-Anesthesia Assessment:                        - Prior to the procedure, a History and Physical was                         performed, and patient medications, allergies and                         sensitivities were reviewed. The patient's tolerance                         of previous anesthesia was reviewed.                        - The risks and benefits of the procedure and the                         sedation options and risks were discussed with the                         patient. All questions were answered and informed                         consent was obtained.                        - ASA Grade Assessment: II - A patient with mild                         systemic disease.                        After obtaining informed consent, the colonoscope was                         passed under direct vision. Throughout the procedure,                         the patient's blood pressure, pulse, and oxygen                         saturations were monitored continuously. The                         Colonoscope was introduced through  the anus and                         advanced to the the cecum, identified by the                         appendiceal orifice. The colonoscopy was performed                         with ease. The patient tolerated the procedure well.                          The quality of the bowel preparation was excellent.                         The ileocecal valve, appendiceal orifice, and rectum                         were photographed. Findings:      The perianal and digital rectal examinations were normal.      Two sessile polyps were found in the sigmoid colon and ascending colon.       The polyps were 5 to 6 mm in size. These polyps were removed with a cold       snare. Resection and retrieval were complete.      The exam was otherwise without abnormality on direct and retroflexion       views. Impression:            - Two 5 to 6 mm polyps in the sigmoid colon and in the                         ascending colon, removed with a cold snare. Resected                         and retrieved.                        - The examination was otherwise normal on direct and                         retroflexion views. Recommendation:        - Discharge patient to home (with escort).                        - Resume previous diet.                        - Continue present medications.                        - Await pathology results.                        - Repeat colonoscopy for surveillance based on                         pathology results. Procedure Code(s):     --- Professional ---  60454, Colonoscopy, flexible; with removal of                         tumor(s), polyp(s), or other lesion(s) by snare                         technique Diagnosis Code(s):     --- Professional ---                        Z12.11, Encounter for screening for malignant neoplasm                         of colon                        D12.5, Benign neoplasm of sigmoid colon                        D12.2, Benign neoplasm of ascending colon CPT copyright 2022 American Medical Association. All rights reserved. The codes documented in this report are preliminary and upon coder review may  be revised to meet current compliance requirements. Luke Salaam, MD Luke Salaam MD, MD 04/17/2024 10:35:43 AM This report has been signed electronically. Number of Addenda: 0 Note Initiated On: 04/17/2024 9:59 AM Scope Withdrawal Time: 0 hours 10 minutes 24 seconds  Total Procedure Duration: 0 hours 16 minutes 27 seconds  Estimated Blood Loss:  Estimated blood loss: none.      [facility]

## 2024-04-17 NOTE — Anesthesia Postprocedure Evaluation (Signed)
 Anesthesia Post Note  Patient: Edwin [name]  Procedure(s) Performed: COLONOSCOPY POLYPECTOMY, INTESTINE  Patient location during evaluation: PACU Anesthesia Type: General Level of consciousness: awake and alert, oriented and patient cooperative Pain management: pain level controlled Vital Signs Assessment: post-procedure vital signs reviewed and stable Respiratory status: spontaneous breathing, nonlabored ventilation and respiratory function stable Cardiovascular status: blood pressure returned to baseline and stable Postop Assessment: adequate PO intake Anesthetic complications: no   No notable events documented.   Last Vitals:  Vitals:   04/17/24 1037 04/17/24 1047  BP: (!) 139/90 (!) 133/90  Pulse:    Resp:    Temp: (!) 35.7 C   SpO2:  100%    Last Pain:  Vitals:   04/17/24 1057  TempSrc:   PainSc: 0-No pain                 Dorothey Gate

## 2024-04-17 NOTE — H&P (Signed)
 Luke Salaam, MD [address], [place], [place], [address] [address], [place], [place], [address] Phone: [phone]  Fax: [phone]  Primary Care Physician:  Laurence Pons, NP   Pre-Procedure History & Physical: HPI:  Edwin [name] is a 58 y.o. male is here for an colonoscopy.   Past Medical History:  Diagnosis Date   Diabetes mellitus without complication (HCC)    Hypercholesteremia    Medical history non-contributory     Past Surgical History:  Procedure Laterality Date   BIOPSY  11/23/2023   Procedure: BIOPSY;  Surgeon: Luke Salaam, MD;  Location: [facility] ENDOSCOPY;  Service: Gastroenterology;;   COLONOSCOPY WITH PROPOFOL  N/A 11/23/2023   Procedure: COLONOSCOPY WITH PROPOFOL ;  Surgeon: Luke Salaam, MD;  Location: [facility] ENDOSCOPY;  Service: Gastroenterology;  Laterality: N/A;   ESOPHAGOGASTRODUODENOSCOPY (EGD) WITH PROPOFOL      ESOPHAGOGASTRODUODENOSCOPY (EGD) WITH PROPOFOL  N/A 11/23/2023   Procedure: ESOPHAGOGASTRODUODENOSCOPY (EGD) WITH PROPOFOL ;  Surgeon: Luke Salaam, MD;  Location: [facility] ENDOSCOPY;  Service: Gastroenterology;  Laterality: N/A;   POLYPECTOMY  11/23/2023   Procedure: POLYPECTOMY;  Surgeon: Luke Salaam, MD;  Location: [facility] ENDOSCOPY;  Service: Gastroenterology;;    Prior to Admission medications   Medication Sig Start Date End Date Taking? Authorizing Provider  Amantadine  HCl 100 MG tablet Take 1 tablet (100 mg total) by mouth 2 (two) times daily. 04/09/24  Yes Abernathy, Alyssa, NP  Cholecalciferol (VITAMIN D3) 1.25 MG (50000 UT) CAPS Take 1.25 mg by mouth daily. 08/02/22  Yes Masoud, Pandora Bogaert, MD  clonazePAM (KLONOPIN) 0.5 MG tablet Take 0.5 mg by mouth at bedtime.   Yes [provider]  docusate sodium  (COLACE) 100 MG capsule Take 1 capsule (100 mg total) by mouth 3 (three) times daily. 04/09/24  Yes Abernathy, Janeice Medal, NP  empagliflozin  (JARDIANCE ) 10 MG TABS tablet Take 1 tablet (10 mg total) by mouth daily. 04/09/24  Yes  Abernathy, Alyssa, NP  escitalopram (LEXAPRO) 10 MG tablet Take 10 mg by mouth daily. 07/06/17  Yes [provider]  HYDROcodone -acetaminophen  (NORCO/VICODIN) 5-325 MG tablet Take 1 tablet by mouth every 6 (six) hours as needed. 10/24/23  Yes Ward, Clover Dao, DO  meloxicam  (MOBIC ) 15 MG tablet Take 1 tablet (15 mg total) by mouth daily as needed for pain (arthritis pain/flare). Take with food 04/09/24  Yes Abernathy, Alyssa, NP  omeprazole  (PRILOSEC) 20 MG capsule TAKE (1) CAPSULE BY MOUTH ONCE DAILY. 04/09/24  Yes Abernathy, Alyssa, NP  polyethylene glycol-electrolytes (NULYTELY) 420 g solution Take by mouth. 03/06/24  Yes [provider]  risperiDONE (RISPERDAL) 1 MG tablet Take 1 mg by mouth daily as needed (severe agitation).   Yes [provider]  risperiDONE (RISPERDAL) 3 MG tablet Take 3 mg by mouth daily. 07/06/17  Yes [provider]  simethicone  (MYLICON) 80 MG chewable tablet Chew 1 tablet (80 mg total) by mouth every 6 (six) hours as needed for flatulence. 04/09/24  Yes Abernathy, Janeice Medal, NP  simvastatin  (ZOCOR ) 20 MG tablet Take 1 tablet (20 mg total) by mouth daily. 04/09/24  Yes Abernathy, Janeice Medal, NP  tamsulosin  (FLOMAX ) 0.4 MG CAPS capsule Take 1 capsule (0.4 mg total) by mouth daily. 02/07/24  Yes Stoioff, Scott C, MD  thioridazine (MELLARIL) 100 MG tablet TAKE 1 TABLET BY MOUTH TWICE DAILY 05/06/21  Yes Masoud, Pandora Bogaert, MD  Vitamin D , Ergocalciferol , (DRISDOL ) 1.25 MG (50000 UNIT) CAPS capsule Take 1 capsule (50,000 Units total) by mouth every 7 (seven) days. 04/09/24  Yes Laurence Pons, NP  Allergies as of 03/06/2024 - Review Complete 02/07/2024  Allergen Reaction Noted   Citrullus vulgaris Hives 04/23/2014    History reviewed. No pertinent family history.  Social History   Socioeconomic History   Marital status: Single    Spouse name: Not on file   Number of children: 0   Years of education: Not on file   Highest education level: Not on file   Occupational History   Occupation: Disabled  Tobacco Use   Smoking status: Never   Smokeless tobacco: Never  Vaping Use   Vaping status: Never Used  Substance and Sexual Activity   Alcohol use: No   Drug use: No   Sexual activity: Not Currently  Other Topics Concern   Not on file  Social History Narrative   Not on file   Social Drivers of Health   Financial Resource Strain: Low Risk  (12/01/2021)   Overall Financial Resource Strain (CARDIA)    Difficulty of Paying Living Expenses: Not hard at all  Food Insecurity: No Food Insecurity (12/01/2021)   Hunger Vital Sign    Worried About Running Out of Food in the Last Year: Never true    Ran Out of Food in the Last Year: Never true  Transportation Needs: No Transportation Needs (12/01/2021)   PRAPARE - Administrator, Civil Service (Medical): No    Lack of Transportation (Non-Medical): No  Physical Activity: Insufficiently Active (12/01/2021)   Exercise Vital Sign    Days of Exercise per Week: 3 days    Minutes of Exercise per Session: 10 min  Stress: No Stress Concern Present (12/01/2021)   [employer] of Occupational Health - Occupational Stress Questionnaire    Feeling of Stress : Not at all  Social Connections: Moderately Integrated (12/01/2021)   Social Connection and Isolation Panel [NHANES]    Frequency of Communication with Friends and Family: More than three times a week    Frequency of Social Gatherings with Friends and Family: Once a week    Attends Religious Services: 1 to 4 times per year    Active Member of [employer] or Organizations: Yes    Attends Engineer, structural: More than 4 times per year    Marital Status: Never married  Intimate Partner Violence: Not At Risk (12/01/2021)   Humiliation, Afraid, Rape, and Kick questionnaire    Fear of Current or Ex-Partner: No    Emotionally Abused: No    Physically Abused: No    Sexually Abused: No    Review of Systems: See HPI, otherwise  negative ROS  Physical Exam: BP (!) 156/120   Pulse 78   Temp (!) 96.2 F (35.7 C) (Temporal)   Resp 20   Ht 6\' 3"  (1.905 m)   Wt (!) 140.6 kg   SpO2 98%   BMI 38.75 kg/m  General:   Alert,  pleasant and cooperative in NAD Head:  Normocephalic and atraumatic. Neck:  Supple; no masses or thyromegaly. Lungs:  Clear throughout to auscultation, normal respiratory effort.    Heart:  +S1, +S2, Regular rate and rhythm, No edema. Abdomen:  Soft, nontender and nondistended. Normal bowel sounds, without guarding, and without rebound.   Neurologic:  Alert and  oriented x4;  grossly normal neurologically.  Impression/Plan: Edwin [name] is here for an colonoscopy to be performed for Screening colonoscopy average risk   Risks, benefits, limitations, and alternatives regarding  colonoscopy have been reviewed with the patient.  Questions have been answered.  All parties  agreeable.   Luke Salaam, MD  04/17/2024, 10:00 AM

## 2024-04-18 ENCOUNTER — Encounter: Payer: Self-pay | Admitting: Gastroenterology

## 2024-04-18 LAB — SURGICAL PATHOLOGY

## 2024-04-19 ENCOUNTER — Ambulatory Visit: Payer: Self-pay | Admitting: Gastroenterology

## 2024-04-20 LAB — CMP14+EGFR
ALT: 29 IU/L (ref 0–44)
AST: 25 IU/L (ref 0–40)
Albumin: 3.7 g/dL — ABNORMAL LOW (ref 3.8–4.9)
Alkaline Phosphatase: 129 IU/L — ABNORMAL HIGH (ref 44–121)
BUN/Creatinine Ratio: 8 — ABNORMAL LOW (ref 9–20)
BUN: 14 mg/dL (ref 6–24)
Bilirubin Total: 0.4 mg/dL (ref 0.0–1.2)
CO2: 22 mmol/L (ref 20–29)
Calcium: 9 mg/dL (ref 8.7–10.2)
Chloride: 107 mmol/L — ABNORMAL HIGH (ref 96–106)
Creatinine, Ser: 1.75 mg/dL — ABNORMAL HIGH (ref 0.76–1.27)
Globulin, Total: 2.4 g/dL (ref 1.5–4.5)
Glucose: 99 mg/dL (ref 70–99)
Potassium: 4 mmol/L (ref 3.5–5.2)
Sodium: 142 mmol/L (ref 134–144)
Total Protein: 6.1 g/dL (ref 6.0–8.5)
eGFR: 45 mL/min/{1.73_m2} — ABNORMAL LOW (ref >59)

## 2024-04-20 LAB — CBC WITH DIFFERENTIAL/PLATELET
Basophils Absolute: 0 10*3/uL (ref 0.0–0.2)
Basos: 1 %
EOS (ABSOLUTE): 0.1 10*3/uL (ref 0.0–0.4)
Eos: 2 %
Hematocrit: 45.3 % (ref 37.5–51.0)
Hemoglobin: 14.4 g/dL (ref 13.0–17.7)
Immature Grans (Abs): 0 10*3/uL (ref 0.0–0.1)
Immature Granulocytes: 0 %
Lymphocytes Absolute: 1.2 10*3/uL (ref 0.7–3.1)
Lymphs: 31 %
MCH: 27.3 pg (ref 26.6–33.0)
MCHC: 31.8 g/dL (ref 31.5–35.7)
MCV: 86 fL (ref 79–97)
Monocytes Absolute: 0.4 10*3/uL (ref 0.1–0.9)
Monocytes: 12 %
Neutrophils Absolute: 2 10*3/uL (ref 1.4–7.0)
Neutrophils: 54 %
Platelets: 191 10*3/uL (ref 150–450)
RBC: 5.27 x10E6/uL (ref 4.14–5.80)
RDW: 13.1 % (ref 11.6–15.4)
WBC: 3.7 10*3/uL (ref 3.4–10.8)

## 2024-04-20 LAB — LIPID PANEL
Chol/HDL Ratio: 3.5 ratio (ref 0.0–5.0)
Cholesterol, Total: 150 mg/dL (ref 100–199)
HDL: 43 mg/dL (ref >39)
LDL Chol Calc (NIH): 90 mg/dL (ref 0–99)
Triglycerides: 87 mg/dL (ref 0–149)
VLDL Cholesterol Cal: 17 mg/dL (ref 5–40)

## 2024-04-20 LAB — VITAMIN D 25 HYDROXY (VIT D DEFICIENCY, FRACTURES): Vit D, 25-Hydroxy: 48.9 ng/mL (ref 30.0–100.0)

## 2024-04-20 LAB — HGB A1C W/O EAG: Hgb A1c MFr Bld: 6 % — ABNORMAL HIGH (ref 4.8–5.6)

## 2024-05-03 ENCOUNTER — Ambulatory Visit: Admitting: Nurse Practitioner

## 2024-05-05 ENCOUNTER — Encounter: Payer: Self-pay | Admitting: Nurse Practitioner

## 2024-05-20 ENCOUNTER — Encounter: Payer: Self-pay | Admitting: Nurse Practitioner

## 2024-05-20 ENCOUNTER — Ambulatory Visit (INDEPENDENT_AMBULATORY_CARE_PROVIDER_SITE_OTHER): Admitting: Nurse Practitioner

## 2024-05-20 VITALS — BP 132/90 | HR 76 | Temp 97.8°F | Resp 16 | Ht 73.0 in | Wt 312.0 lb

## 2024-05-20 DIAGNOSIS — E1122 Type 2 diabetes mellitus with diabetic chronic kidney disease: Secondary | ICD-10-CM | POA: Diagnosis not present

## 2024-05-20 DIAGNOSIS — Z0000 Encounter for general adult medical examination without abnormal findings: Secondary | ICD-10-CM

## 2024-05-20 DIAGNOSIS — N1832 Chronic kidney disease, stage 3b: Secondary | ICD-10-CM | POA: Diagnosis not present

## 2024-05-20 DIAGNOSIS — E66813 Obesity, class 3: Secondary | ICD-10-CM

## 2024-05-20 DIAGNOSIS — E1169 Type 2 diabetes mellitus with other specified complication: Secondary | ICD-10-CM

## 2024-05-20 DIAGNOSIS — E785 Hyperlipidemia, unspecified: Secondary | ICD-10-CM

## 2024-05-20 DIAGNOSIS — Z6841 Body Mass Index (BMI) 40.0 and over, adult: Secondary | ICD-10-CM

## 2024-05-20 NOTE — Progress Notes (Unsigned)
 [facility] [address] [place], [place] [address]  Internal MEDICINE  Office Visit Note  Patient Name: Edwin [name]  [id number]  [id number]  Date of Service: 05/20/2024  Chief Complaint  Patient presents with   Diabetes   Hyperlipidemia   Medicare Wellness    HPI Male presents for an annual well visit and physical exam.  Well-appearing 58 y.o. male with OSA, GERD, diabetes, obesity, high cholesterol, and schizoaffective disorder.  Routine CRC screening: recent colonoscopy done in may Foot exam -- going to podiatrist for diabetic foot care.  Eye exam -- sees eye doctor regularly.  Labs: discussed lab results with patient and caregiver.  New or worsening pain: intermittent joint pians from arthritis.  Other concerns: none     05/20/2024    9:06 AM  MMSE - Mini Mental State Exam  Not completed: Unable to complete    Functional Status Survey: Is the patient deaf or have difficulty hearing?: No Does the patient have difficulty seeing, even when wearing glasses/contacts?: No Does the patient have difficulty concentrating, remembering, or making decisions?: Yes Does the patient have difficulty walking or climbing stairs?: No Does the patient have difficulty dressing or bathing?: No Does the patient have difficulty doing errands alone such as visiting a doctor's office or shopping?: Yes     04/16/2021    8:45 AM 08/03/2021    8:58 AM 08/24/2021    8:39 AM 12/01/2021    9:08 AM 05/20/2024    9:05 AM  Fall Risk  Falls in the past year? 0 0 0 0 0  Was there an injury with Fall? 0 0 0 0 0  Fall Risk Category Calculator 0 0 0 0 0  Fall Risk Category (Retired) Low  Low  Low  Low    (RETIRED) Patient Fall Risk Level Low fall risk  Low fall risk  Low fall risk  Low fall risk    Patient at Risk for Falls Due to No Fall Risks No Fall Risks No Fall Risks No Fall Risks No Fall Risks  Fall risk Follow up  Falls evaluation completed  Falls evaluation completed  Falls  evaluation completed  Falls evaluation completed     Data saved with a previous flowsheet row definition       05/20/2024    9:05 AM  Depression screen PHQ 2/9  Decreased Interest 0  Down, Depressed, Hopeless 0  PHQ - 2 Score 0        Current Medication: Outpatient Encounter Medications as of 05/20/2024  Medication Sig   Amantadine  HCl 100 MG tablet Take 1 tablet (100 mg total) by mouth 2 (two) times daily.   Cholecalciferol (VITAMIN D3) 1.25 MG (50000 UT) CAPS Take 1.25 mg by mouth daily.   clonazePAM (KLONOPIN) 0.5 MG tablet Take 0.5 mg by mouth at bedtime.   docusate sodium  (COLACE) 100 MG capsule Take 1 capsule (100 mg total) by mouth 3 (three) times daily.   empagliflozin  (JARDIANCE ) 10 MG TABS tablet Take 1 tablet (10 mg total) by mouth daily.   escitalopram (LEXAPRO) 10 MG tablet Take 10 mg by mouth daily.   HYDROcodone -acetaminophen  (NORCO/VICODIN) 5-325 MG tablet Take 1 tablet by mouth every 6 (six) hours as needed.   meloxicam  (MOBIC ) 15 MG tablet Take 1 tablet (15 mg total) by mouth daily as needed for pain (arthritis pain/flare). Take with food   omeprazole  (PRILOSEC) 20 MG capsule TAKE (1) CAPSULE BY MOUTH ONCE DAILY.   polyethylene glycol-electrolytes (NULYTELY) 420  g solution Take by mouth.   risperiDONE (RISPERDAL) 1 MG tablet Take 1 mg by mouth daily as needed (severe agitation).   risperiDONE (RISPERDAL) 3 MG tablet Take 3 mg by mouth daily.   simethicone  (MYLICON) 80 MG chewable tablet Chew 1 tablet (80 mg total) by mouth every 6 (six) hours as needed for flatulence.   simvastatin  (ZOCOR ) 20 MG tablet Take 1 tablet (20 mg total) by mouth daily.   tamsulosin  (FLOMAX ) 0.4 MG CAPS capsule Take 1 capsule (0.4 mg total) by mouth daily.   thioridazine (MELLARIL) 100 MG tablet TAKE 1 TABLET BY MOUTH TWICE DAILY   Vitamin D , Ergocalciferol , (DRISDOL ) 1.25 MG (50000 UNIT) CAPS capsule Take 1 capsule (50,000 Units total) by mouth every 7 (seven) days.   No  facility-administered encounter medications on file as of 05/20/2024.    Surgical History: Past Surgical History:  Procedure Laterality Date   BIOPSY  11/23/2023   Procedure: BIOPSY;  Surgeon: Luke Salaam, MD;  Location: [facility] ENDOSCOPY;  Service: Gastroenterology;;   COLONOSCOPY N/A 04/17/2024   Procedure: COLONOSCOPY;  Surgeon: Luke Salaam, MD;  Location: [facility] ENDOSCOPY;  Service: Gastroenterology;  Laterality: N/A;   COLONOSCOPY WITH PROPOFOL  N/A 11/23/2023   Procedure: COLONOSCOPY WITH PROPOFOL ;  Surgeon: Luke Salaam, MD;  Location: [facility] ENDOSCOPY;  Service: Gastroenterology;  Laterality: N/A;   ESOPHAGOGASTRODUODENOSCOPY (EGD) WITH PROPOFOL      ESOPHAGOGASTRODUODENOSCOPY (EGD) WITH PROPOFOL  N/A 11/23/2023   Procedure: ESOPHAGOGASTRODUODENOSCOPY (EGD) WITH PROPOFOL ;  Surgeon: Luke Salaam, MD;  Location: [facility] ENDOSCOPY;  Service: Gastroenterology;  Laterality: N/A;   POLYPECTOMY  11/23/2023   Procedure: POLYPECTOMY;  Surgeon: Luke Salaam, MD;  Location: [facility] ENDOSCOPY;  Service: Gastroenterology;;   POLYPECTOMY  04/17/2024   Procedure: POLYPECTOMY, INTESTINE;  Surgeon: Luke Salaam, MD;  Location: [facility] ENDOSCOPY;  Service: Gastroenterology;;    Medical History: Past Medical History:  Diagnosis Date   Diabetes mellitus without complication (HCC)    Hypercholesteremia    Medical history non-contributory     Family History: History reviewed. No pertinent family history.  Social History   Socioeconomic History   Marital status: Single    Spouse name: Not on file   Number of children: 0   Years of education: Not on file   Highest education level: Not on file  Occupational History   Occupation: Disabled  Tobacco Use   Smoking status: Never   Smokeless tobacco: Never  Vaping Use   Vaping status: Never Used  Substance and Sexual Activity   Alcohol use: No   Drug use: No   Sexual activity: Not Currently  Other Topics Concern   Not on file  Social History Narrative   Not on  file   Social Drivers of Health   Financial Resource Strain: Low Risk  (05/08/2024)   Received from [facility]   Overall Financial Resource Strain (CARDIA)    Difficulty of Paying Living Expenses: Not hard at all  Food Insecurity: No Food Insecurity (05/08/2024)   Received from [facility]   Hunger Vital Sign    Within the past 12 months, you worried that your food would run out before you got the money to buy more.: Never true    Within the past 12 months, the food you bought just didn't last and you didn't have money to get more.: Never true  Transportation Needs: No Transportation Needs (05/08/2024)   Received from [facility] - Transportation    In the past 12 months,  has lack of transportation kept you from medical appointments or from getting medications?: No    Lack of Transportation (Non-Medical): No  Physical Activity: Insufficiently Active (12/01/2021)   Exercise Vital Sign    Days of Exercise per Week: 3 days    Minutes of Exercise per Session: 10 min  Stress: No Stress Concern Present (12/01/2021)   [employer] of Occupational Health - Occupational Stress Questionnaire    Feeling of Stress : Not at all  Social Connections: Moderately Integrated (12/01/2021)   Social Connection and Isolation Panel    Frequency of Communication with Friends and Family: More than three times a week    Frequency of Social Gatherings with Friends and Family: Once a week    Attends Religious Services: 1 to 4 times per year    Active Member of [employer] or Organizations: Yes    Attends Engineer, structural: More than 4 times per year    Marital Status: Never married  Intimate Partner Violence: Not At Risk (12/01/2021)   Humiliation, Afraid, Rape, and Kick questionnaire    Fear of Current or Ex-Partner: No    Emotionally Abused: No    Physically Abused: No    Sexually Abused: No      Review of Systems   Constitutional:  Negative for activity change, appetite change, chills, fatigue, fever and unexpected weight change.  HENT: Negative.  Negative for congestion, ear pain, rhinorrhea, sore throat and trouble swallowing.   Eyes: Negative.   Respiratory: Negative.  Negative for cough, chest tightness, shortness of breath and wheezing.   Cardiovascular: Negative.  Negative for chest pain and palpitations.  Gastrointestinal: Negative.  Negative for abdominal pain, blood in stool, constipation, diarrhea, nausea and vomiting.  Endocrine: Negative.   Genitourinary: Negative.  Negative for difficulty urinating, dysuria, frequency, hematuria and urgency.  Musculoskeletal: Negative.  Negative for arthralgias, back pain, joint swelling, myalgias and neck pain.  Skin: Negative.  Negative for rash and wound.  Allergic/Immunologic: Negative.  Negative for immunocompromised state.  Neurological: Negative.  Negative for dizziness, seizures, weakness, numbness and headaches.  Hematological: Negative.   Psychiatric/Behavioral:  Positive for behavioral problems and dysphoric mood. Negative for self-injury, sleep disturbance and suicidal ideas. The patient is nervous/anxious.     Vital Signs: BP (!) 132/90   Pulse 76   Temp 97.8 F (36.6 C)   Resp 16   Ht 6' 1 (1.854 m)   Wt (!) 312 lb (141.5 kg)   SpO2 97%   BMI 41.16 kg/m    Physical Exam Vitals reviewed.  Constitutional:      General: He is not in acute distress.    Appearance: Normal appearance. He is well-developed. He is obese. He is not ill-appearing or diaphoretic.  HENT:     Head: Normocephalic and atraumatic.     Right Ear: Tympanic membrane, ear canal and external ear normal.     Left Ear: Tympanic membrane, ear canal and external ear normal.     Nose: Nose normal. No congestion or rhinorrhea.     Mouth/Throat:     Mouth: Mucous membranes are moist.     Pharynx: Oropharynx is clear. No oropharyngeal exudate or posterior oropharyngeal  erythema.   Eyes:     General: No scleral icterus.       Right eye: No discharge.        Left eye: No discharge.     Extraocular Movements: Extraocular movements intact.     Conjunctiva/sclera: Conjunctivae normal.  Pupils: Pupils are equal, round, and reactive to light.   Neck:     Thyroid: No thyromegaly.     Vascular: No JVD.     Trachea: No tracheal deviation.   Cardiovascular:     Rate and Rhythm: Normal rate and regular rhythm.     Pulses: Normal pulses.     Heart sounds: Normal heart sounds. No murmur heard.    No friction rub. No gallop.  Pulmonary:     Effort: Pulmonary effort is normal. No respiratory distress.     Breath sounds: Normal breath sounds. No stridor. No wheezing or rales.  Chest:     Chest wall: No tenderness.  Abdominal:     General: Bowel sounds are normal. There is no distension.     Palpations: Abdomen is soft. There is no mass.     Tenderness: There is no abdominal tenderness. There is no guarding or rebound.   Musculoskeletal:        General: No tenderness or deformity. Normal range of motion.     Cervical back: Normal range of motion and neck supple.  Lymphadenopathy:     Cervical: No cervical adenopathy.   Skin:    General: Skin is warm and dry.     Capillary Refill: Capillary refill takes less than 2 seconds.     Coloration: Skin is not pale.     Findings: No erythema or rash.   Neurological:     Mental Status: He is alert and oriented to person, place, and time.     Cranial Nerves: No cranial nerve deficit.     Motor: No abnormal muscle tone.     Coordination: Coordination normal.     Gait: Gait normal.     Deep Tendon Reflexes: Reflexes are normal and symmetric.   Psychiatric:        Mood and Affect: Mood normal.        Behavior: Behavior normal.        Thought Content: Thought content normal.        Judgment: Judgment normal.        Assessment/Plan: 1. Encounter for subsequent annual wellness visit (AWV) in Medicare  patient (Primary) Age-appropriate preventive screenings and vaccinations discussed. Routine labs for health maintenance results discussed with patient and caregiver.  PHM updated.    2. Type 2 diabetes mellitus with stage 3b chronic kidney disease, without long-term current use of insulin (HCC) Continue jardiance  as prescribed.   3. Stage 3b chronic kidney disease (HCC) Continue jardiance  as prescribed   4. Hyperlipidemia associated with type 2 diabetes mellitus (HCC) Continue simvastatin  as prescribed.   5. Class 3 severe obesity with serious comorbidity and body mass index (BMI) of 40.0 to 44.9 in adult Encourage continued diabetic diet and walking regularly as tolerated.      General Counseling: nicolaos mitrano understanding of the findings of todays visit and agrees with plan of treatment. I have discussed any further diagnostic evaluation that may be needed or ordered today. We also reviewed his medications today. he has been encouraged to call the office with any questions or concerns that should arise related to todays visit.    No orders of the defined types were placed in this encounter.   No orders of the defined types were placed in this encounter.   Return in about 4 months (around 09/19/2024) for F/U, Recheck A1C, Deiondra Denley PCP.   Total time spent:30 Minutes Time spent includes review of chart, medications, test results, and follow up  plan with the patient.   [place] Controlled Substance Database was reviewed by me.  This patient was seen by Laurence Pons, FNP-C in collaboration with Dr. Verneta Gone as a part of collaborative care agreement.  Oscar Hank R. Bobbi Burow, MSN, FNP-C Internal medicine

## 2024-05-21 ENCOUNTER — Telehealth: Payer: Self-pay | Admitting: Nurse Practitioner

## 2024-05-21 NOTE — Telephone Encounter (Signed)
 Notified [name] that FL2 is completed. He will p/u @ front desk. Scanned-Toni

## 2024-05-23 ENCOUNTER — Encounter: Payer: Self-pay | Admitting: Nurse Practitioner

## 2024-07-12 ENCOUNTER — Other Ambulatory Visit: Payer: Self-pay | Admitting: Nurse Practitioner

## 2024-07-12 DIAGNOSIS — M150 Primary generalized (osteo)arthritis: Secondary | ICD-10-CM

## 2024-07-12 NOTE — Telephone Encounter (Signed)
 Please review and send

## 2024-07-18 ENCOUNTER — Ambulatory Visit: Admitting: Nurse Practitioner

## 2024-07-18 ENCOUNTER — Encounter: Payer: Self-pay | Admitting: Nurse Practitioner

## 2024-07-18 ENCOUNTER — Telehealth: Payer: Self-pay | Admitting: Nurse Practitioner

## 2024-07-18 VITALS — BP 136/84 | HR 83 | Temp 98.3°F | Resp 16 | Ht 73.0 in | Wt 313.6 lb

## 2024-07-18 DIAGNOSIS — N3943 Post-void dribbling: Secondary | ICD-10-CM | POA: Diagnosis not present

## 2024-07-18 DIAGNOSIS — R1084 Generalized abdominal pain: Secondary | ICD-10-CM

## 2024-07-18 DIAGNOSIS — E1122 Type 2 diabetes mellitus with diabetic chronic kidney disease: Secondary | ICD-10-CM

## 2024-07-18 DIAGNOSIS — N1832 Chronic kidney disease, stage 3b: Secondary | ICD-10-CM

## 2024-07-18 DIAGNOSIS — R1031 Right lower quadrant pain: Secondary | ICD-10-CM

## 2024-07-18 DIAGNOSIS — R300 Dysuria: Secondary | ICD-10-CM | POA: Diagnosis not present

## 2024-07-18 LAB — POCT URINALYSIS DIPSTICK
Bilirubin, UA: NEGATIVE
Blood, UA: NEGATIVE
Glucose, UA: POSITIVE — AB
Leukocytes, UA: NEGATIVE
Nitrite, UA: NEGATIVE
Protein, UA: NEGATIVE
Spec Grav, UA: 1.015 (ref 1.010–1.025)
Urobilinogen, UA: 0.2 U/dL
pH, UA: 5 (ref 5.0–8.0)

## 2024-07-18 MED ORDER — HYDROCODONE-ACETAMINOPHEN 5-325 MG PO TABS: 1.0000 | ORAL_TABLET | Freq: Four times a day (QID) | ORAL | 0 refills | Status: DC | PRN

## 2024-07-18 NOTE — Progress Notes (Signed)
 [facility] [address], [place] [address]  Internal MEDICINE  Office Visit Note  Patient Name: Edwin [name]  [id number]  [id number]  Date of Service: 08/16/2024  Chief Complaint  Patient presents with   Acute Visit    Pain stomach and in leg(right). Since last week,      HPI Noeh presents for an acute sick visit for abdominal/groin pain  --onset lower groin/right side pelvic pain where the inner thigh meets the pelvis. Concern for pulled muscle vs ligament issue? This pain was noted about 2 weeks ago. Patient reports that it is aching, constant and worsens with walking. Noted to be limping.  Also havign diffuse abdominal pain that initially starts around the umbilicus and comes and goes.     Current Medication:  Outpatient Encounter Medications as of 07/18/2024  Medication Sig   Amantadine  HCl 100 MG tablet Take 1 tablet (100 mg total) by mouth 2 (two) times daily.   Cholecalciferol (VITAMIN D3) 1.25 MG (50000 UT) CAPS Take 1.25 mg by mouth daily.   clonazePAM (KLONOPIN) 0.5 MG tablet Take 0.5 mg by mouth at bedtime.   docusate sodium  (COLACE) 100 MG capsule Take 1 capsule (100 mg total) by mouth 3 (three) times daily.   empagliflozin  (JARDIANCE ) 10 MG TABS tablet Take 1 tablet (10 mg total) by mouth daily.   escitalopram (LEXAPRO) 10 MG tablet Take 10 mg by mouth daily.   meloxicam  (MOBIC ) 15 MG tablet TAKE 1 TABLET BY MOUTH ONCE DAILY FOR ARTHRITIS FLAIR/PAIN (TAKE WITH FOOD) AS NEEDED   omeprazole  (PRILOSEC) 20 MG capsule TAKE (1) CAPSULE BY MOUTH ONCE DAILY.   polyethylene glycol-electrolytes (NULYTELY) 420 g solution Take by mouth.   risperiDONE (RISPERDAL) 1 MG tablet Take 1 mg by mouth daily as needed (severe agitation).   risperiDONE (RISPERDAL) 3 MG tablet Take 3 mg by mouth daily.   simethicone  (MYLICON) 80 MG chewable tablet Chew 1 tablet (80 mg total) by mouth every 6 (six) hours as needed for flatulence.   simvastatin  (ZOCOR ) 20 MG tablet  Take 1 tablet (20 mg total) by mouth daily.   tamsulosin  (FLOMAX ) 0.4 MG CAPS capsule Take 1 capsule (0.4 mg total) by mouth daily.   thioridazine (MELLARIL) 100 MG tablet TAKE 1 TABLET BY MOUTH TWICE DAILY   Vitamin D , Ergocalciferol , (DRISDOL ) 1.25 MG (50000 UNIT) CAPS capsule Take 1 capsule (50,000 Units total) by mouth every 7 (seven) days.   [DISCONTINUED] HYDROcodone -acetaminophen  (NORCO/VICODIN) 5-325 MG tablet Take 1 tablet by mouth every 6 (six) hours as needed.   [DISCONTINUED] HYDROcodone -acetaminophen  (NORCO/VICODIN) 5-325 MG tablet Take 1 tablet by mouth every 6 (six) hours as needed.   No facility-administered encounter medications on file as of 07/18/2024.      Medical History: Past Medical History:  Diagnosis Date   Diabetes mellitus without complication (HCC)    Hypercholesteremia    Medical history non-contributory      Vital Signs: BP 136/84   Pulse 83   Temp 98.3 F (36.8 C)   Resp 16   Ht 6' 1 (1.854 m)   Wt (!) 313 lb 9.6 oz (142.2 kg)   SpO2 97%   BMI 41.37 kg/m    Review of Systems  Constitutional:  Negative for chills, fatigue and unexpected weight change.  HENT:  Negative for congestion, postnasal drip, rhinorrhea, sneezing and sore throat.   Eyes:  Negative for redness.  Respiratory:  Negative for cough, chest tightness, shortness of breath and wheezing.   Cardiovascular:  Negative.  Negative for chest pain and palpitations.  Gastrointestinal:  Negative for abdominal pain, constipation, diarrhea, nausea and vomiting.  Genitourinary:  Negative for dysuria and frequency.  Musculoskeletal:  Positive for arthralgias. Negative for back pain, joint swelling and neck pain.  Skin:  Negative for rash.  Neurological: Negative.  Negative for tremors and numbness.  Hematological:  Negative for adenopathy. Does not bruise/bleed easily.  Psychiatric/Behavioral:  Positive for behavioral problems (Depression). Negative for self-injury, sleep disturbance and  suicidal ideas. The patient is nervous/anxious.     Physical Exam Vitals reviewed.  Constitutional:      General: He is not in acute distress.    Appearance: Normal appearance. He is obese. He is not ill-appearing.  HENT:     Head: Normocephalic and atraumatic.  Eyes:     Pupils: Pupils are equal, round, and reactive to light.  Cardiovascular:     Rate and Rhythm: Normal rate and regular rhythm.  Pulmonary:     Effort: Pulmonary effort is normal. No respiratory distress.  Abdominal:     General: Bowel sounds are normal. There is no distension.     Palpations: Abdomen is soft. There is no mass.     Tenderness: There is no abdominal tenderness.     Hernia: No hernia is present.  Skin:    General: Skin is warm and dry.  Neurological:     Mental Status: He is alert. Mental status is at baseline.       Assessment/Plan: 1. Diffuse abdominal pain (Primary) CT abdomen pelvis ordered  - CT ABDOMEN PELVIS WO CONTRAST; Future  2. Unilateral groin pain, right CT abdomen pelvis ordered  - CT ABDOMEN PELVIS WO CONTRAST; Future  3. Type 2 diabetes mellitus with stage 3b chronic kidney disease, without long-term current use of insulin (HCC) Urine sent for microalbumin/creatinine ratio.  - Urine Microalbumin w/creat. ratio  4. Post-void dribbling Urien culture sent, PSA lab ordered  - PSA Total (Reflex To Free) - CULTURE, URINE COMPREHENSIVE - POCT Urinalysis Dipstick  5. Dysuria Urine culture sent, and psa lab ordered  - PSA Total (Reflex To Free) - CULTURE, URINE COMPREHENSIVE - POCT Urinalysis Dipstick   General Counseling: Roe verbalizes understanding of the findings of todays visit and agrees with plan of treatment. I have discussed any further diagnostic evaluation that may be needed or ordered today. We also reviewed his medications today. he has been encouraged to call the office with any questions or concerns that should arise related to todays  visit.    Counseling:    Orders Placed This Encounter  Procedures   CULTURE, URINE COMPREHENSIVE   CT ABDOMEN PELVIS WO CONTRAST   PSA Total (Reflex To Free)   Urine Microalbumin w/creat. ratio   POCT Urinalysis Dipstick    Meds ordered this encounter  Medications   DISCONTD: HYDROcodone -acetaminophen  (NORCO/VICODIN) 5-325 MG tablet    Sig: Take 1 tablet by mouth every 6 (six) hours as needed.    Dispense:  15 tablet    Refill:  0    Return for f/u for results of CT .   Controlled Substance Database was reviewed by me for overdose risk score (ORS)  Time spent:30 Minutes Time spent with patient included reviewing progress notes, labs, imaging studies, and discussing plan for follow up.   This patient was seen by Mardy Maxin, FNP-C in collaboration with Dr. Sigrid Bathe as a part of collaborative care agreement.  Bently Morath R. Maxin, MSN, FNP-C Internal Medicine

## 2024-07-18 NOTE — Telephone Encounter (Signed)
 Notified caregiver of CT appointment date, arrival time, location -[place]

## 2024-07-19 LAB — MICROALBUMIN / CREATININE URINE RATIO
Creatinine, Urine: 157.6 mg/dL
Microalb/Creat Ratio: 3 mg/g{creat} (ref 0–29)
Microalbumin, Urine: 4.7 ug/mL

## 2024-07-22 ENCOUNTER — Ambulatory Visit

## 2024-07-23 ENCOUNTER — Ambulatory Visit
Admission: RE | Admit: 2024-07-23 | Discharge: 2024-07-23 | Disposition: A | Discharge status: Home / Self Care | Source: Ambulatory Visit | Attending: Nurse Practitioner | Admitting: Nurse Practitioner

## 2024-07-23 DIAGNOSIS — R1084 Generalized abdominal pain: Secondary | ICD-10-CM | POA: Diagnosis present

## 2024-07-23 DIAGNOSIS — R1031 Right lower quadrant pain: Secondary | ICD-10-CM | POA: Diagnosis present

## 2024-07-24 LAB — CULTURE, URINE COMPREHENSIVE

## 2024-07-26 ENCOUNTER — Ambulatory Visit (INDEPENDENT_AMBULATORY_CARE_PROVIDER_SITE_OTHER): Admitting: Nurse Practitioner

## 2024-07-26 ENCOUNTER — Encounter: Payer: Self-pay | Admitting: Nurse Practitioner

## 2024-07-26 VITALS — BP 136/80 | HR 77 | Temp 97.9°F | Resp 16 | Ht 73.0 in | Wt 312.0 lb

## 2024-07-26 DIAGNOSIS — H6123 Impacted cerumen, bilateral: Secondary | ICD-10-CM | POA: Diagnosis not present

## 2024-07-26 NOTE — Progress Notes (Signed)
 [facility] [address] [place], [place] [address]  Internal MEDICINE  Office Visit Note  Patient Name: Edwin [name]  [id number]  [id number]  Date of Service: 07/26/2024  Chief Complaint  Patient presents with   Acute Visit    Unable to hear-bilateral     HPI Edwin [name] presents for an acute sick visit for trouble hearing --onset about 2 weeks ago or more reports hearing loss, muffled sounding Denies any ear pain, pressure or drainage.    Current Medication:  Outpatient Encounter Medications as of 07/26/2024  Medication Sig   Amantadine  HCl 100 MG tablet Take 1 tablet (100 mg total) by mouth 2 (two) times daily.   Cholecalciferol (VITAMIN D3) 1.25 MG (50000 UT) CAPS Take 1.25 mg by mouth daily.   clonazePAM (KLONOPIN) 0.5 MG tablet Take 0.5 mg by mouth at bedtime.   docusate sodium  (COLACE) 100 MG capsule Take 1 capsule (100 mg total) by mouth 3 (three) times daily.   empagliflozin  (JARDIANCE ) 10 MG TABS tablet Take 1 tablet (10 mg total) by mouth daily.   escitalopram (LEXAPRO) 10 MG tablet Take 10 mg by mouth daily.   HYDROcodone -acetaminophen  (NORCO/VICODIN) 5-325 MG tablet Take 1 tablet by mouth every 6 (six) hours as needed.   meloxicam  (MOBIC ) 15 MG tablet TAKE 1 TABLET BY MOUTH ONCE DAILY FOR ARTHRITIS FLAIR/PAIN (TAKE WITH FOOD) AS NEEDED   omeprazole  (PRILOSEC) 20 MG capsule TAKE (1) CAPSULE BY MOUTH ONCE DAILY.   polyethylene glycol-electrolytes (NULYTELY) 420 g solution Take by mouth.   risperiDONE (RISPERDAL) 1 MG tablet Take 1 mg by mouth daily as needed (severe agitation).   risperiDONE (RISPERDAL) 3 MG tablet Take 3 mg by mouth daily.   simethicone  (MYLICON) 80 MG chewable tablet Chew 1 tablet (80 mg total) by mouth every 6 (six) hours as needed for flatulence.   simvastatin  (ZOCOR ) 20 MG tablet Take 1 tablet (20 mg total) by mouth daily.   tamsulosin  (FLOMAX ) 0.4 MG CAPS capsule Take 1 capsule (0.4 mg total) by mouth daily.   thioridazine  (MELLARIL) 100 MG tablet TAKE 1 TABLET BY MOUTH TWICE DAILY   Vitamin D , Ergocalciferol , (DRISDOL ) 1.25 MG (50000 UNIT) CAPS capsule Take 1 capsule (50,000 Units total) by mouth every 7 (seven) days.   No facility-administered encounter medications on file as of 07/26/2024.      Medical History: Past Medical History:  Diagnosis Date   Diabetes mellitus without complication (HCC)    Hypercholesteremia    Medical history non-contributory      Vital Signs: BP 136/80   Pulse 77   Temp 97.9 F (36.6 C)   Resp 16   Ht 6' 1 (1.854 m)   Wt (!) 312 lb (141.5 kg)   SpO2 97%   BMI 41.16 kg/m    Review of Systems  Constitutional:  Negative for chills, fatigue and unexpected weight change.  HENT:  Positive for hearing loss. Negative for congestion, postnasal drip, rhinorrhea, sneezing and sore throat.   Eyes:  Negative for redness.  Respiratory: Negative.  Negative for cough, chest tightness, shortness of breath and wheezing.   Cardiovascular: Negative.  Negative for chest pain and palpitations.  Gastrointestinal: Negative.  Negative for abdominal pain, constipation, diarrhea, nausea and vomiting.  Genitourinary:  Negative for dysuria and frequency.  Musculoskeletal:  Negative for arthralgias, back pain, joint swelling and neck pain.  Skin:  Negative for rash.  Neurological: Negative.  Negative for tremors and numbness.  Hematological:  Negative for adenopathy. Does not  bruise/bleed easily.  Psychiatric/Behavioral:  Negative for behavioral problems (Depression), sleep disturbance and suicidal ideas. The patient is not nervous/anxious.     Physical Exam Vitals reviewed.  Constitutional:      Appearance: Normal appearance. He is obese. He is not ill-appearing.  HENT:     Head: Normocephalic and atraumatic.     Right Ear: There is impacted cerumen.     Left Ear: There is impacted cerumen.  Eyes:     Pupils: Pupils are equal, round, and reactive to light.  Cardiovascular:      Rate and Rhythm: Normal rate and regular rhythm.  Pulmonary:     Effort: Pulmonary effort is normal. No respiratory distress.  Skin:    General: Skin is warm and dry.     Capillary Refill: Capillary refill takes less than 2 seconds.  Neurological:     Mental Status: He is alert and oriented to person, place, and time.  Psychiatric:        Mood and Affect: Mood normal.        Behavior: Behavior normal.       Assessment/Plan: 1. Impacted cerumen of both ears (Primary) Referred to ENT for ear lavage.  - Ambulatory referral to ENT   General Counseling: Manus oakland understanding of the findings of todays visit and agrees with plan of treatment. I have discussed any further diagnostic evaluation that may be needed or ordered today. We also reviewed his medications today. he has been encouraged to call the office with any questions or concerns that should arise related to todays visit.    Counseling:    Orders Placed This Encounter  Procedures   Ambulatory referral to ENT    No orders of the defined types were placed in this encounter.   Return if symptoms worsen or fail to improve.  [place] Controlled Substance Database was reviewed by me for overdose risk score (ORS)  Time spent:30 Minutes Time spent with patient included reviewing progress notes, labs, imaging studies, and discussing plan for follow up.   This patient was seen by Mardy Maxin, FNP-C in collaboration with Dr. Sigrid Bathe as a part of collaborative care agreement.  Destanee Bedonie R. Maxin, MSN, FNP-C Internal Medicine

## 2024-07-27 LAB — PSA TOTAL (REFLEX TO FREE): Prostate Specific Ag, Serum: 3 ng/mL (ref 0.0–4.0)

## 2024-07-29 ENCOUNTER — Telehealth: Payer: Self-pay | Admitting: Nurse Practitioner

## 2024-07-29 NOTE — Telephone Encounter (Signed)
 Otolaryngology referral sent via [facility] to [facility]. Notified Tod (caregiver). Gave telephone # [phone]

## 2024-07-30 ENCOUNTER — Other Ambulatory Visit: Payer: Self-pay | Admitting: Nurse Practitioner

## 2024-07-30 MED ORDER — HYDROCODONE-ACETAMINOPHEN 5-325 MG PO TABS: 1.0000 | ORAL_TABLET | Freq: Four times a day (QID) | ORAL | 0 refills | Status: DC | PRN

## 2024-08-06 ENCOUNTER — Encounter: Payer: Self-pay | Admitting: Urology

## 2024-08-08 ENCOUNTER — Telehealth: Payer: Self-pay | Admitting: Nurse Practitioner

## 2024-08-08 NOTE — Telephone Encounter (Signed)
 Otolaryngology appointment 08/21/2024 @ [facility] -[place]

## 2024-08-13 ENCOUNTER — Ambulatory Visit
Admission: RE | Admit: 2024-08-13 | Discharge: 2024-08-13 | Disposition: A | Discharge status: Home / Self Care | Source: Ambulatory Visit | Attending: Nurse Practitioner | Admitting: Nurse Practitioner

## 2024-08-13 ENCOUNTER — Other Ambulatory Visit: Payer: Self-pay | Admitting: Nurse Practitioner

## 2024-08-13 DIAGNOSIS — M5441 Lumbago with sciatica, right side: Secondary | ICD-10-CM

## 2024-08-13 DIAGNOSIS — R2681 Unsteadiness on feet: Secondary | ICD-10-CM

## 2024-08-13 DIAGNOSIS — R1031 Right lower quadrant pain: Secondary | ICD-10-CM

## 2024-08-13 MED ORDER — HYDROCODONE-ACETAMINOPHEN 5-325 MG PO TABS: 1.0000 | ORAL_TABLET | Freq: Four times a day (QID) | ORAL | 0 refills | Status: DC | PRN

## 2024-08-13 NOTE — Progress Notes (Unsigned)
 Abd/pelvis CT is normal.   New symptoms of unsteady gait Still having low back pain, right groin/hip pain

## 2024-08-15 ENCOUNTER — Ambulatory Visit: Admitting: Nurse Practitioner

## 2024-08-15 ENCOUNTER — Encounter: Payer: Self-pay | Admitting: Nurse Practitioner

## 2024-08-15 VITALS — BP 130/86 | HR 94 | Temp 97.0°F | Resp 16 | Ht 73.0 in | Wt 308.0 lb

## 2024-08-15 DIAGNOSIS — R2681 Unsteadiness on feet: Secondary | ICD-10-CM | POA: Diagnosis not present

## 2024-08-15 DIAGNOSIS — M48062 Spinal stenosis, lumbar region with neurogenic claudication: Secondary | ICD-10-CM | POA: Diagnosis not present

## 2024-08-15 DIAGNOSIS — M4716 Other spondylosis with myelopathy, lumbar region: Secondary | ICD-10-CM | POA: Diagnosis not present

## 2024-08-15 NOTE — Progress Notes (Addendum)
 [facility] [address] [place], [place] [address]  Internal MEDICINE  Office Visit Note  Patient Name: Edwin [name]  [id number]  [id number]  Date of Service: 08/15/2024  Chief Complaint  Patient presents with   Diabetes   Hyperlipidemia   Follow-up    Review CT results    HPI Edwin [name] presents for a follow-up visit for CT results  Abdominal/pelvis CT scan was negative for any abnormalities to explain the patient's pain.  CT lumbar spine was done, see results below: Vertebrae: No evidence of acute fracture, traumatic subluxation or pars defect. There is advanced multilevel facet arthropathy. The lumbar pedicles are relatively short on a congenital basis. There are partially ankylosing osteophytes of the sacroiliac joints bilaterally. Paraspinal and other soft tissues: No acute paraspinal findings. Mild aortoiliac atherosclerosis. Diffuse fatty replacement of the pancreas. Disc levels: L1-2: Preserved disc height with mild disc bulging and anterior osteophytes. No significant spinal stenosis or foraminal narrowing. L2-3: Mild loss of disc height with mild disc bulging, facet and ligamentous hypertrophy. Mild spinal stenosis and mild foraminal narrowing bilaterally. L3-4: Mild loss of disc height with mild disc bulging. Moderate to advanced facet and ligamentous hypertrophy. Resulting moderate multifactorial spinal stenosis with mild to moderate lateral recess and foraminal narrowing bilaterally. L4-5: Preserved disc height with mild disc bulging and endplate osteophytes. Advanced facet and ligamentous hypertrophy. Resulting moderate multifactorial spinal stenosis with mild to moderate lateral recess and foraminal narrowing bilaterally. L5-S1: Preserved disc height. Mild disc bulging with moderate bilateral facet hypertrophy. Mild spinal stenosis with mild foraminal narrowing bilaterally. IMPRESSION: 1. No acute osseous findings in the lumbar spine. 2.  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 3. Mild spinal stenosis and mild foraminal narrowing bilaterally at L2-3 and L5-S1. 4.  Aortic Atherosclerosis (ICD10-I70.0).  Patient's sister is present for today's visit. She reports that their father had issues with his lower back and eventually was unable to walk anymore. She reports that their grandfather and great-grandfather had similar problems. She also reports that she has had back problems and has had multiple back surgeries. It is possible that there is a genetic component to the patient's back pain.  He also has been more unsteady when walking and has been using a cane but he is very tall and the cane is not the appropriate height for him so he needs a cane and possibly a lightweight wheelchair in case he becomes unable to walk because his balance his quickly worsened over the past month.  He may also need a bedside commode if his walking gets worse or if he has a fall.   Current Medication: Outpatient Encounter Medications as of 08/15/2024  Medication Sig   Amantadine  HCl 100 MG tablet Take 1 tablet (100 mg total) by mouth 2 (two) times daily.   Cholecalciferol (VITAMIN D3) 1.25 MG (50000 UT) CAPS Take 1.25 mg by mouth daily.   clonazePAM (KLONOPIN) 0.5 MG tablet Take 0.5 mg by mouth at bedtime.   docusate sodium  (COLACE) 100 MG capsule Take 1 capsule (100 mg total) by mouth 3 (three) times daily.   empagliflozin  (JARDIANCE ) 10 MG TABS tablet Take 1 tablet (10 mg total) by mouth daily.   escitalopram (LEXAPRO) 10 MG tablet Take 10 mg by mouth daily.   HYDROcodone -acetaminophen  (NORCO/VICODIN) 5-325 MG tablet Take 1 tablet by mouth 2 (two) times daily as needed for moderate pain (pain score 4-6) or severe pain (pain  score 7-10).   meloxicam  (MOBIC ) 15 MG tablet TAKE 1 TABLET BY MOUTH ONCE DAILY FOR ARTHRITIS FLAIR/PAIN (TAKE WITH FOOD) AS  NEEDED   omeprazole  (PRILOSEC) 20 MG capsule TAKE (1) CAPSULE BY MOUTH ONCE DAILY.   polyethylene glycol-electrolytes (NULYTELY) 420 g solution Take by mouth.   risperiDONE (RISPERDAL) 1 MG tablet Take 1 mg by mouth daily as needed (severe agitation).   risperiDONE (RISPERDAL) 3 MG tablet Take 3 mg by mouth daily.   simethicone  (MYLICON) 80 MG chewable tablet Chew 1 tablet (80 mg total) by mouth every 6 (six) hours as needed for flatulence.   simvastatin  (ZOCOR ) 20 MG tablet Take 1 tablet (20 mg total) by mouth daily.   tamsulosin  (FLOMAX ) 0.4 MG CAPS capsule Take 1 capsule (0.4 mg total) by mouth daily.   thioridazine (MELLARIL) 100 MG tablet TAKE 1 TABLET BY MOUTH TWICE DAILY   Vitamin D , Ergocalciferol , (DRISDOL ) 1.25 MG (50000 UNIT) CAPS capsule Take 1 capsule (50,000 Units total) by mouth every 7 (seven) days.   [DISCONTINUED] HYDROcodone -acetaminophen  (NORCO/VICODIN) 5-325 MG tablet Take 1 tablet by mouth every 6 (six) hours as needed.   No facility-administered encounter medications on file as of 08/15/2024.    Surgical History: Past Surgical History:  Procedure Laterality Date   BIOPSY  11/23/2023   Procedure: BIOPSY;  Surgeon: Therisa Bi, MD;  Location: [facility] ENDOSCOPY;  Service: Gastroenterology;;   COLONOSCOPY N/A 04/17/2024   Procedure: COLONOSCOPY;  Surgeon: Therisa Bi, MD;  Location[facility] ENDOSCOPY;  Service: Gastroenterology;  Laterality: N/A;   COLONOSCOPY WITH PROPOFOL  N/A 11/23/2023   Procedure: COLONOSCOPY WITH PROPOFOL ;  Surgeon: Therisa Bi, MD;  Location: [facility] ENDOSCOPY;  Service: Gastroenterology;  Laterality: N/A;   ESOPHAGOGASTRODUODENOSCOPY (EGD) WITH PROPOFOL      ESOPHAGOGASTRODUODENOSCOPY (EGD) WITH PROPOFOL  N/A 11/23/2023   Procedure: ESOPHAGOGASTRODUODENOSCOPY (EGD) WITH PROPOFOL ;  Surgeon: Therisa Bi, MD;  Location: [facility] ENDOSCOPY;  Service: Gastroenterology;  Laterality: N/A;   POLYPECTOMY  11/23/2023   Procedure: POLYPECTOMY;  Surgeon: Therisa Bi, MD;   Location: [facility] ENDOSCOPY;  Service: Gastroenterology;;   POLYPECTOMY  04/17/2024   Procedure: POLYPECTOMY, INTESTINE;  Surgeon: Therisa Bi, MD;  Location: [facility] ENDOSCOPY;  Service: Gastroenterology;;    Medical History: Past Medical History:  Diagnosis Date   Diabetes mellitus without complication (HCC)    Hypercholesteremia    Medical history non-contributory     Family History: History reviewed. No pertinent family history.  Social History   Socioeconomic History   Marital status: Single    Spouse name: Not on file   Number of children: 0   Years of education: Not on file   Highest education level: Not on file  Occupational History   Occupation: Disabled  Tobacco Use   Smoking status: Never   Smokeless tobacco: Never  Vaping Use   Vaping status: Never Used  Substance and Sexual Activity   Alcohol use: No   Drug use: No   Sexual activity: Not Currently  Other Topics Concern   Not on file  Social History Narrative   Not on file   Social Drivers of Health   Financial Resource Strain: Low Risk  (05/08/2024)   Received from [facility]   Overall Financial Resource Strain (CARDIA)    Difficulty of Paying Living Expenses: Not hard at all  Food Insecurity: No Food Insecurity (05/08/2024)   Received from [facility]   Hunger Vital Sign    Within the past 12 months, you worried that your food would run out  before you got the money to buy more.: Never true    Within the past 12 months, the food you bought just didn't last and you didn't have money to get more.: Never true  Transportation Needs: No Transportation Needs (05/08/2024)   Received from [facility] - Transportation    In the past 12 months, has lack of transportation kept you from medical appointments or from getting medications?: No    Lack of Transportation (Non-Medical): No  Physical Activity: Insufficiently Active (12/01/2021)   Exercise Vital Sign     Days of Exercise per Week: 3 days    Minutes of Exercise per Session: 10 min  Stress: No Stress Concern Present (12/01/2021)   [employer] of Occupational Health - Occupational Stress Questionnaire    Feeling of Stress : Not at all  Social Connections: Moderately Integrated (12/01/2021)   Social Connection and Isolation Panel    Frequency of Communication with Friends and Family: More than three times a week    Frequency of Social Gatherings with Friends and Family: Once a week    Attends Religious Services: 1 to 4 times per year    Active Member of [employer] or Organizations: Yes    Attends Banker Meetings: More than 4 times per year    Marital Status: Never married  Intimate Partner Violence: Not At Risk (12/01/2021)   Humiliation, Afraid, Rape, and Kick questionnaire    Fear of Current or Ex-Partner: No    Emotionally Abused: No    Physically Abused: No    Sexually Abused: No      Review of Systems  Constitutional:  Positive for fatigue. Negative for chills and unexpected weight change.  HENT:  Negative for congestion, postnasal drip, rhinorrhea, sneezing and sore throat.   Eyes:  Negative for redness.  Respiratory: Negative.  Negative for cough, chest tightness, shortness of breath and wheezing.   Cardiovascular: Negative.  Negative for chest pain and palpitations.  Gastrointestinal: Negative.  Negative for abdominal pain, constipation, diarrhea, nausea and vomiting.  Genitourinary:  Negative for dysuria and frequency.  Musculoskeletal:  Positive for arthralgias, back pain and gait problem. Negative for joint swelling and neck pain.  Skin:  Negative for rash.  Neurological:  Positive for weakness. Negative for tremors and numbness.  Hematological:  Negative for adenopathy. Does not bruise/bleed easily.  Psychiatric/Behavioral:  Positive for sleep disturbance. Negative for behavioral problems (Depression), self-injury and suicidal ideas. The patient is not  nervous/anxious.     Vital Signs: BP 130/86   Pulse 94   Temp (!) 97 F (36.1 C)   Resp 16   Ht 6' 1 (1.854 m)   Wt (!) 308 lb (139.7 kg)   SpO2 97%   BMI 40.64 kg/m    Physical Exam Vitals reviewed.  Constitutional:      General: He is not in acute distress.    Appearance: Normal appearance. He is obese. He is not ill-appearing.  HENT:     Head: Normocephalic and atraumatic.  Eyes:     Pupils: Pupils are equal, round, and reactive to light.  Cardiovascular:     Rate and Rhythm: Normal rate and regular rhythm.  Pulmonary:     Effort: Pulmonary effort is normal. No respiratory distress.  Musculoskeletal:     Lumbar back: Spasms present. Decreased range of motion.     Right hip: Decreased range of motion.     Left hip: Decreased range of motion.  Skin:  General: Skin is warm and dry.  Neurological:     Mental Status: He is alert.     Motor: Weakness present.     Gait: Gait abnormal.  Psychiatric:        Mood and Affect: Mood normal.        Behavior: Behavior normal.        Assessment/Plan: 1. 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 Referred urgently to neurosurgery  - Ambulatory referral to Neurosurgery - HYDROcodone -acetaminophen  (NORCO/VICODIN) 5-325 MG tablet; Take 1 tablet by mouth 2 (two) times daily as needed for moderate pain (pain score 4-6) or severe pain (pain score 7-10).  Dispense: 60 tablet; Refill: 0  2. Spinal stenosis of lumbar region with neurogenic claudication Pain medication refill ordered. Referred urgently to neurosurgery  - Ambulatory referral to Neurosurgery - HYDROcodone -acetaminophen  (NORCO/VICODIN) 5-325 MG tablet; Take 1 tablet by mouth 2 (two) times daily as needed for moderate pain (pain score 4-6) or severe pain (pain score 7-10).  Dispense: 60 tablet; Refill: 0  3. Unsteady gait when walking Referred to neurosurgery urgently. May need a more sturdy cane, lightweight wheelchair  and possibly a bedside commode to make it easier for the patient to move around and decrease the risk of falling.  - Ambulatory referral to Neurosurgery   General Counseling: Manus oakland understanding of the findings of todays visit and agrees with plan of treatment. I have discussed any further diagnostic evaluation that may be needed or ordered today. We also reviewed his medications today. he has been encouraged to call the office with any questions or concerns that should arise related to todays visit.    Orders Placed This Encounter  Procedures   Ambulatory referral to Neurosurgery    Meds ordered this encounter  Medications   HYDROcodone -acetaminophen  (NORCO/VICODIN) 5-325 MG tablet    Sig: Take 1 tablet by mouth 2 (two) times daily as needed for moderate pain (pain score 4-6) or severe pain (pain score 7-10).    Dispense:  60 tablet    Refill:  0    For refill, note increased number of tablets and change in the frequency. Fill script today    Return for keep previously scheduled follow up.   Total time spent:30 Minutes Time spent includes review of chart, medications, test results, and follow up plan with the patient.   [place] Controlled Substance Database was reviewed by me.  This patient was seen by Mardy Maxin, FNP-C in collaboration with Dr. Sigrid Bathe as a part of collaborative care agreement.   Markiesha Delia R. Maxin, MSN, FNP-C Internal medicine

## 2024-08-16 ENCOUNTER — Encounter: Payer: Self-pay | Admitting: Nurse Practitioner

## 2024-08-16 MED ORDER — HYDROCODONE-ACETAMINOPHEN 5-325 MG PO TABS: 1.0000 | ORAL_TABLET | Freq: Two times a day (BID) | ORAL | 0 refills | Status: DC | PRN

## 2024-08-16 NOTE — Progress Notes (Signed)
 Referring Physician:  Liana Fish, NP [address] [place],  [place] [address]  Primary Physician:  Liana Fish, NP  History of Present Illness: 08/19/2024 Edwin [name] has a history of GERD, OSA, DM, CKD stage 3b, obesity, hyperlipidemia, schizoaffective disorder.   He stays at a group home. His sister is his legal guardian and she helps with history (Edwin [name]).   Has been seeing PCP for gait unsteadiness, balance issues that have been worse in last month.   1-2 month history of bilateral leg pain into posterior legs and groin. He has a lot of pain into his shins bilaterally. Pain is worse with walking- he uses a cane. Some relief when he stops to sit. He has numbness, tingling, and weakness.   No dexterity issues. Not dropping things. No neck pain. Some soreness in his arms.   6 months he was walking normally with no issues.   He is taking norco from his PCP. He is on mobic  as well.   Tobacco use: Does not smoke.   Bowel/Bladder Dysfunction: some urinary urgency, no incontinence of bowel/bladder.   Conservative measures:  Physical therapy: none Multimodal medical therapy including regular antiinflammatories:  hydrocodone , meloxicam ,  Injections:  no epidural steroid injections  Past Surgery: no spinal surgeries   Edwin [name] has gait/balance issues.   The symptoms are causing a significant impact on the patient's life.   Review of Systems:  A 10 point review of systems is negative, except for the pertinent positives and negatives detailed in the HPI.  Past Medical History: Past Medical History:  Diagnosis Date   Diabetes mellitus without complication (HCC)    Hypercholesteremia    Medical history non-contributory     Past Surgical History: Past Surgical History:  Procedure Laterality Date   BIOPSY  11/23/2023   Procedure: BIOPSY;  Surgeon: Therisa Bi, MD;  Location: [facility] ENDOSCOPY;  Service: Gastroenterology;;   COLONOSCOPY N/A  04/17/2024   Procedure: COLONOSCOPY;  Surgeon: Therisa Bi, MD;  Location[facility] ENDOSCOPY;  Service: Gastroenterology;  Laterality: N/A;   COLONOSCOPY WITH PROPOFOL  N/A 11/23/2023   Procedure: COLONOSCOPY WITH PROPOFOL ;  Surgeon: Therisa Bi, MD;  Location: [facility] ENDOSCOPY;  Service: Gastroenterology;  Laterality: N/A;   ESOPHAGOGASTRODUODENOSCOPY (EGD) WITH PROPOFOL      ESOPHAGOGASTRODUODENOSCOPY (EGD) WITH PROPOFOL  N/A 11/23/2023   Procedure: ESOPHAGOGASTRODUODENOSCOPY (EGD) WITH PROPOFOL ;  Surgeon: Therisa Bi, MD;  Location: [facility] ENDOSCOPY;  Service: Gastroenterology;  Laterality: N/A;   POLYPECTOMY  11/23/2023   Procedure: POLYPECTOMY;  Surgeon: Therisa Bi, MD;  Location: [facility] ENDOSCOPY;  Service: Gastroenterology;;   POLYPECTOMY  04/17/2024   Procedure: POLYPECTOMY, INTESTINE;  Surgeon: Therisa Bi, MD;  Location: [facility] ENDOSCOPY;  Service: Gastroenterology;;    Allergies: Allergies as of 08/19/2024 - Review Complete 08/16/2024  Allergen Reaction Noted   Citrullus vulgaris Hives 04/23/2014    Medications: Outpatient Encounter Medications as of 08/19/2024  Medication Sig   empagliflozin  (JARDIANCE ) 10 MG TABS tablet Take 1 tablet (10 mg total) by mouth daily.   escitalopram (LEXAPRO) 10 MG tablet Take 10 mg by mouth daily.   omeprazole  (PRILOSEC) 20 MG capsule TAKE (1) CAPSULE BY MOUTH ONCE DAILY.   Amantadine  HCl 100 MG tablet Take 1 tablet (100 mg total) by mouth 2 (two) times daily.   Cholecalciferol (VITAMIN D3) 1.25 MG (50000 UT) CAPS Take 1.25 mg by mouth daily.   clonazePAM (KLONOPIN) 0.5 MG tablet Take 0.5 mg by mouth at bedtime.   docusate sodium  (COLACE) 100 MG capsule Take 1  capsule (100 mg total) by mouth 3 (three) times daily.   HYDROcodone -acetaminophen  (NORCO/VICODIN) 5-325 MG tablet Take 1 tablet by mouth 2 (two) times daily as needed for moderate pain (pain score 4-6) or severe pain (pain score 7-10).   meloxicam  (MOBIC ) 15 MG tablet TAKE 1 TABLET BY MOUTH ONCE DAILY  FOR ARTHRITIS FLAIR/PAIN (TAKE WITH FOOD) AS NEEDED   polyethylene glycol-electrolytes (NULYTELY) 420 g solution Take by mouth.   risperiDONE (RISPERDAL) 1 MG tablet Take 1 mg by mouth daily as needed (severe agitation).   risperiDONE (RISPERDAL) 3 MG tablet Take 3 mg by mouth daily.   simethicone  (MYLICON) 80 MG chewable tablet Chew 1 tablet (80 mg total) by mouth every 6 (six) hours as needed for flatulence.   simvastatin  (ZOCOR ) 20 MG tablet Take 1 tablet (20 mg total) by mouth daily.   tamsulosin  (FLOMAX ) 0.4 MG CAPS capsule Take 1 capsule (0.4 mg total) by mouth daily.   thioridazine (MELLARIL) 100 MG tablet TAKE 1 TABLET BY MOUTH TWICE DAILY   Vitamin D , Ergocalciferol , (DRISDOL ) 1.25 MG (50000 UNIT) CAPS capsule Take 1 capsule (50,000 Units total) by mouth every 7 (seven) days.   [DISCONTINUED] HYDROcodone -acetaminophen  (NORCO/VICODIN) 5-325 MG tablet Take 1 tablet by mouth every 6 (six) hours as needed.   No facility-administered encounter medications on file as of 08/19/2024.    Social History: Social History   Tobacco Use   Smoking status: Never   Smokeless tobacco: Never  Vaping Use   Vaping status: Never Used  Substance Use Topics   Alcohol use: No   Drug use: No    Family Medical History: No family history on file.  Physical Examination: Vitals:   08/19/24 0837 08/19/24 0905  BP: (!) 130/92 130/82    General: Patient is well developed, well nourished, calm, collected, and in no apparent distress. Attention to examination is appropriate.  Respiratory: Patient is breathing without any difficulty.   NEUROLOGICAL:     He follows directions appropriately.   Cranial Nerves: Pupils equal round and reactive to light.  Facial tone is symmetric.    No lower lumbar tenderness.   No abnormal lesions on exposed skin.   Strength: Side Biceps Triceps Deltoid Interossei Grip Wrist Ext. Wrist Flex.  R 5 5 5 5 5 5 5   L 5 5 5 5 5 5 5    Side Iliopsoas Quads Hamstring  PF DF EHL  R 5 5 5 5 5 5   L 5 5 5 5 5 5    Reflexes are 3+ and symmetric at the biceps, brachioradialis, patella and achilles.   Hoffman's is absent.  Clonus is not present.   Bilateral upper and lower extremity sensation is intact to light touch.     No pain with IR/ER of both hips.   Gait is slow and unsteady. He takes small steps.   Medical Decision Making  Imaging: CT of lumbar spine dated 08/13/24:  FINDINGS: Segmentation: There are 5 lumbar type vertebral bodies.   Alignment: Normal.   Vertebrae: No evidence of acute fracture, traumatic subluxation or pars defect. There is advanced multilevel facet arthropathy. The lumbar pedicles are relatively short on a congenital basis. There are partially ankylosing osteophytes of the sacroiliac joints bilaterally.   Paraspinal and other soft tissues: No acute paraspinal findings. Mild aortoiliac atherosclerosis. Diffuse fatty replacement of the pancreas.   Disc levels:   L1-2: Preserved disc height with mild disc bulging and anterior osteophytes. No significant spinal stenosis or foraminal narrowing.   L2-3:  Mild loss of disc height with mild disc bulging, facet and ligamentous hypertrophy. Mild spinal stenosis and mild foraminal narrowing bilaterally.   L3-4: Mild loss of disc height with mild disc bulging. Moderate to advanced facet and ligamentous hypertrophy. Resulting moderate multifactorial spinal stenosis with mild to moderate lateral recess and foraminal narrowing bilaterally.   L4-5: Preserved disc height with mild disc bulging and endplate osteophytes. Advanced facet and ligamentous hypertrophy. Resulting moderate multifactorial spinal stenosis with mild to moderate lateral recess and foraminal narrowing bilaterally.   L5-S1: Preserved disc height. Mild disc bulging with moderate bilateral facet hypertrophy. Mild spinal stenosis with mild foraminal narrowing bilaterally.   IMPRESSION: 1. No acute osseous  findings in the lumbar spine. 2. 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 3. Mild spinal stenosis and mild foraminal narrowing bilaterally at L2-3 and L5-S1. 4.  Aortic Atherosclerosis (ICD10-I70.0).     Electronically Signed   By: Elsie Perone M.D.   On: 08/13/2024 15:03  I have personally reviewed the images and agree with the above interpretation.  Assessment and Plan: Mr. Koloski has a 1-2 month history of bilateral leg pain into posterior legs and groin. He has a lot of pain into his shins bilaterally. Pain is worse with walking- he uses a cane. He has numbness, tingling, and weakness.   He's had decline in his walking- his gait is very slow and unsteady in last 1-2 months. No dexterity issues. 6 months ago, he was walking normally with no issues.   He has mild central and bilateral foraminal stenosis L2-L3 and L5-S1 along with moderate central stenosis and mild/moderate bilateral foraminal stenosis L3-L5.   Above spinal stenosis may explain his leg symptoms with walking, but would not explain gait instability or hyper reflexia.   His sister helps with history- she is his legal guardian.   Treatment options discussed with patient and following plan made:   - MRI of cervical, thoracic, and lumbar spine to evaluate gait changes, instability, and hyper reflexia. Will do WIDE BORE MRI. - Continue with cane as needed for stability.  - Continue on norco from PCP.  - Once I have results back, will set up phone visit with his sister (legal guardian) to discuss.   I spent a total of 40 minutes in face-to-face and non-face-to-face activities related to this patient's care today including review of outside records, review of imaging, review of symptoms, physical exam, discussion of differential diagnosis, discussion of treatment options, and documentation.   Thank you for involving me  in the care of this patient.   Glade Boys PA-C Dept. of Neurosurgery

## 2024-08-19 ENCOUNTER — Encounter: Payer: Self-pay | Admitting: Orthopedic Surgery

## 2024-08-19 ENCOUNTER — Ambulatory Visit (INDEPENDENT_AMBULATORY_CARE_PROVIDER_SITE_OTHER): Admitting: Orthopedic Surgery

## 2024-08-19 VITALS — BP 130/82 | Ht 74.0 in | Wt 315.1 lb

## 2024-08-19 DIAGNOSIS — R292 Abnormal reflex: Secondary | ICD-10-CM | POA: Diagnosis not present

## 2024-08-19 DIAGNOSIS — M48062 Spinal stenosis, lumbar region with neurogenic claudication: Secondary | ICD-10-CM

## 2024-08-19 DIAGNOSIS — R2681 Unsteadiness on feet: Secondary | ICD-10-CM

## 2024-08-19 DIAGNOSIS — M5416 Radiculopathy, lumbar region: Secondary | ICD-10-CM

## 2024-08-19 DIAGNOSIS — M47816 Spondylosis without myelopathy or radiculopathy, lumbar region: Secondary | ICD-10-CM

## 2024-08-19 NOTE — Addendum Note (Signed)
 Addended byBETHA HILMA HASTINGS on: 08/19/2024 09:27 AM   Modules accepted: Orders

## 2024-08-19 NOTE — Addendum Note (Signed)
 Addended byBETHA HILMA HASTINGS on: 08/19/2024 09:28 AM   Modules accepted: Orders

## 2024-08-19 NOTE — Patient Instructions (Signed)
 It was so nice to see you today. Thank you so much for coming in.    He has lumbar spinal stenosis (pressure on the spinal cord) and this is likely causing symptoms in his legs. I don't think this would explain his walking and balance issues.   I want to get an MRI of your neck, mid back, and lower back to look into things further. We will get this approved through your insurance and [facility] will call you to schedule the appointment. Ask about your patient responsibility. You do not need to pay this prior to getting MRI, they can bill you.   After you have the MRIs done, it can take 14-28 days for me to get the results back. If I don't have them in 2 weeks, we will call to try to get the results.   Once I have the results, we will call your sister to schedule a follow up phone visit with me to review them.   Please do not hesitate to call if you have any questions or concerns. You can also message me in MyChart.   Glade Boys PA-C [phone]     The physicians and staff at [facility] are committed to providing excellent care. You may receive a survey asking for feedback about your experience at our office. We value you your feedback and appreciate you taking the time to to fill it out. The [facility] leadership team is also available to discuss your experience in person, feel free to contact us  [phone].

## 2024-08-19 NOTE — Addendum Note (Signed)
 Addended by: BENN AUSTON HERO on: 08/19/2024 09:34 AM   Modules accepted: Orders

## 2024-08-23 ENCOUNTER — Ambulatory Visit
Admission: RE | Admit: 2024-08-23 | Discharge: 2024-08-23 | Disposition: A | Discharge status: Home / Self Care | Source: Ambulatory Visit | Attending: Orthopedic Surgery | Admitting: Orthopedic Surgery

## 2024-08-23 DIAGNOSIS — R292 Abnormal reflex: Secondary | ICD-10-CM | POA: Diagnosis present

## 2024-08-23 DIAGNOSIS — R2681 Unsteadiness on feet: Secondary | ICD-10-CM | POA: Insufficient documentation

## 2024-08-23 DIAGNOSIS — M47816 Spondylosis without myelopathy or radiculopathy, lumbar region: Secondary | ICD-10-CM | POA: Diagnosis present

## 2024-08-23 DIAGNOSIS — M48062 Spinal stenosis, lumbar region with neurogenic claudication: Secondary | ICD-10-CM

## 2024-08-23 DIAGNOSIS — M5416 Radiculopathy, lumbar region: Secondary | ICD-10-CM | POA: Diagnosis present

## 2024-08-27 ENCOUNTER — Telehealth: Payer: Self-pay

## 2024-08-27 NOTE — Telephone Encounter (Signed)
 Pt caregiver walk in this morning for prescription was bedside commode alyssa wrote and its in scan

## 2024-08-28 ENCOUNTER — Telehealth: Payer: Self-pay | Admitting: Orthopedic Surgery

## 2024-08-28 DIAGNOSIS — R2681 Unsteadiness on feet: Secondary | ICD-10-CM

## 2024-08-28 DIAGNOSIS — G959 Disease of spinal cord, unspecified: Secondary | ICD-10-CM

## 2024-08-28 NOTE — Telephone Encounter (Signed)
 MRI scans reviewed with Dr. 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   Called his sister Edwin [name] (his legal guadian) and discussed MRI results.   He had another fall since seeing me and hurt his knee- was seen at [facility].   Follow up scheduled for him to see Dr. Claudene on 09/04/24 to discuss surgery options. Deena will be there. He will get cervical xrays with flex/ext at [facility] prior to that visit.   Reviewed red flags- will go to ED if these occur.

## 2024-08-28 NOTE — Telephone Encounter (Signed)
 Faxed senior medical note for bedside commode

## 2024-09-02 NOTE — Progress Notes (Signed)
 Referring Physician:  Liana Fish, NP [address] [place],  [place] [address]  Primary Physician:  Liana Fish, NP  History of Present Illness: 09/04/24 Mr. 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  He has been having progressive weakness in his bilateral lower extremities with worsening gait and balance.  He has also noticed worsening function in his hands and coordination.  His family is quite concerned that they have noticed his balance has gotten significantly worse rapidly.  He was recently seen by Glade Boys and was referred for evaluation.  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  Has been over the course of the past 2 to 3 months he has had progressive back pain and posterior leg pain and numbness.  He was walking with a cane but now he started to walk with a rollator.  He gets some relief when he rests.  They do feel like this is progressing quite rapidly.    Conservative measures:  Physical therapy: none Multimodal medical therapy including regular antiinflammatories:  hydrocodone , meloxicam ,  Injections:  no epidural steroid injections   Past Surgery: no spinal surgeries    ZENON LEAF has gait/balance issues.   Review of Systems:  A 10 point review of systems is negative, except for the pertinent positives and negatives detailed in the HPI.  Past Medical History: Past Medical History:  Diagnosis Date   Diabetes mellitus without complication (HCC)    Hypercholesteremia    Medical history non-contributory     Past Surgical History: Past Surgical History:  Procedure Laterality Date   BIOPSY  11/23/2023   Procedure: BIOPSY;  Surgeon: Therisa Bi, MD;  Location: [facility] ENDOSCOPY;  Service: Gastroenterology;;   COLONOSCOPY N/A 04/17/2024   Procedure: COLONOSCOPY;  Surgeon: Therisa Bi, MD;  Location: [facility] ENDOSCOPY;  Service:  Gastroenterology;  Laterality: N/A;   COLONOSCOPY WITH PROPOFOL  N/A 11/23/2023   Procedure: COLONOSCOPY WITH PROPOFOL ;  Surgeon: Therisa Bi, MD;  Location: [facility] ENDOSCOPY;  Service: Gastroenterology;  Laterality: N/A;   ESOPHAGOGASTRODUODENOSCOPY (EGD) WITH PROPOFOL      ESOPHAGOGASTRODUODENOSCOPY (EGD) WITH PROPOFOL  N/A 11/23/2023   Procedure: ESOPHAGOGASTRODUODENOSCOPY (EGD) WITH PROPOFOL ;  Surgeon: Therisa Bi, MD;  Location: [facility] ENDOSCOPY;  Service: Gastroenterology;  Laterality: N/A;   POLYPECTOMY  11/23/2023   Procedure: POLYPECTOMY;  Surgeon: Therisa Bi, MD;  Location: [facility] ENDOSCOPY;  Service: Gastroenterology;;   POLYPECTOMY  04/17/2024   Procedure: POLYPECTOMY, INTESTINE;  Surgeon: Therisa Bi, MD;  Location: [facility] ENDOSCOPY;  Service: Gastroenterology;;    Allergies: Allergies as of 09/04/2024 - Review Complete 09/04/2024  Allergen Reaction Noted   Citrullus vulgaris Hives 04/23/2014    Medications: No current facility-administered medications for this visit. No current outpatient medications on file.  Facility-Administered Medications Ordered in Other Visits:    [MAR Hold] acetaminophen  (TYLENOL ) tablet 650 mg, 650 mg, Oral, Q6H PRN, 650 mg at 09/09/24 2126 **OR** [MAR Hold] acetaminophen  (TYLENOL ) suppository 650 mg, 650 mg, Rectal, Q6H PRN, Cox, Amy N, DO   [MAR Hold] amantadine  (SYMMETREL ) capsule 100 mg, 100 mg, Oral, BID, Cox, Amy N, DO, 100 mg at 09/11/24 2146   [facility] Hold] artificial tears ophthalmic solution 1 drop, 1 drop, Both Eyes, BID, Awanda [place], MD, 1 drop at 09/11/24 1538   [MAR Hold] bisacodyl (DULCOLAX) suppository 10 mg, 10 mg, Rectal, Daily PRN, Jhonny, Sudheer B, MD, 10 mg at 09/10/24 1211   ceFAZolin (ANCEF) IVPB 3g/150  mL premix, 3 g, Intravenous, Once, Claudene Penne ORN, MD   [facility] Hold] clonazePAM (KLONOPIN) tablet 0.5 mg, 0.5 mg, Oral, QHS PRN, Cox, Amy N, DO, 0.5 mg at 09/09/24 0131   [MAR Hold] docusate sodium  (COLACE) capsule 100 mg, 100 mg, Oral,  TID, Cox, Amy N, DO, 100 mg at 09/11/24 2146   [facility] Hold] escitalopram (LEXAPRO) tablet 10 mg, 10 mg, Oral, Daily, Cox, Amy N, DO, 10 mg at 09/11/24 0925   [MAR Hold] loratadine (CLARITIN) tablet 10 mg, 10 mg, Oral, Daily, Awanda City, MD, 10 mg at 09/11/24 1514   [MAR Hold] ondansetron  (ZOFRAN ) tablet 4 mg, 4 mg, Oral, Q6H PRN **OR** [MAR Hold] ondansetron  (ZOFRAN ) injection 4 mg, 4 mg, Intravenous, Q6H PRN, Cox, Amy N, DO   [MAR Hold] oxyCODONE (Oxy IR/ROXICODONE) immediate release tablet 5-10 mg, 5-10 mg, Oral, Q4H PRN, Jhonny, Sudheer B, MD, 5 mg at 09/10/24 0006   [MAR Hold] pantoprazole (PROTONIX) EC tablet 40 mg, 40 mg, Oral, Daily, Cox, Amy N, DO, 40 mg at 09/11/24 0924   [MAR Hold] risperiDONE (RISPERDAL) tablet 3 mg, 3 mg, Oral, QHS, Cox, Amy N, DO, 3 mg at 09/11/24 2146   [facility] Hold] senna-docusate (Senokot-S) tablet 1 tablet, 1 tablet, Oral, BID, Sreenath, Sudheer B, MD, 1 tablet at 09/11/24 2146   [facility] Hold] simvastatin  (ZOCOR ) tablet 20 mg, 20 mg, Oral, Daily, Cox, Amy N, DO, 20 mg at 09/11/24 0925   [MAR Hold] tamsulosin  (FLOMAX ) capsule 0.4 mg, 0.4 mg, Oral, Daily, Cox, Amy N, DO, 0.4 mg at 09/11/24 0925   [MAR Hold] thioridazine (MELLARIL) tablet 100 mg, 100 mg, Oral, BID, Greenwood, Howard F, RPH, 100 mg at 09/11/24 2146   vancomycin (VANCOCIN) IVPB 1000 mg/200 mL premix, 1,000 mg, Intravenous, Once, Claudene Penne ORN, MD, Last Rate: 200 mL/hr at 09/12/24 0857, 1,000 mg at 09/12/24 0857  Social History: Social History   Tobacco Use   Smoking status: Never   Smokeless tobacco: Never  Vaping Use   Vaping status: Never Used  Substance Use Topics   Alcohol use: No   Drug use: No    Family Medical History: Family History  Problem Relation Age of Onset   Diabetes Father    Hypertension Father    Hypertension Sister    Diabetes Sister     Physical Examination: Vitals:   09/04/24 1339  BP: 134/84    General: Patient is in no apparent distress. Attention to examination  is appropriate.  Neck:   Supple.  Full range of motion.  Respiratory: Patient is breathing without any difficulty.   NEUROLOGICAL:     Awake, alert, oriented to person, place, and time.  Speech is clear and fluent.   Cranial Nerves: Pupils equal round and reactive to light.  Facial tone is symmetric.  Facial sensation is symmetric. Shoulder shrug is symmetric. Tongue protrusion is midline.    Strength: Side Biceps Triceps Deltoid Interossei Grip Wrist Ext. Wrist Flex.  R 5 5 4+ 4 4 5 5   L 5 5 4+ 4 4 5 5    Side Iliopsoas Quads Hamstring PF DF EHL  R 4 5 5 5 5 5   L 4 5 5 5 5 5    He does have evidence of hyperreflexia, he does have some difficulty relaxing for his reflexes.  Notable Tromner reflex bilaterally.  His gait has gotten rapidly worse.  He was previously ambulating without assistance then over the past few months which to a cane and progressed to needing a  rollator.   Imaging: Narrative & Impression  CLINICAL DATA:  Gait instability and hyper reflexia. Ongoing low back pain radiating into both legs. No known injury or prior relevant surgery. Recent clinical evaluation by neuro surgery on 08/19/2024.   EXAM: MRI CERVICAL, THORACIC AND LUMBAR SPINE WITHOUT CONTRAST   TECHNIQUE: Multiplanar and multiecho pulse sequences of the cervical spine, to include the craniocervical junction and cervicothoracic junction, and thoracic and lumbar spine, were obtained without intravenous contrast.   COMPARISON:  CT lumbar spine 08/13/2024, abdominopelvic CT 07/23/2024 and thoracic spine radiographs 09/22/2005.   FINDINGS: MRI CERVICAL SPINE FINDINGS   Alignment: Physiologic.   Vertebrae: No acute or suspicious osseous findings. Mild hyperintensity within the C5-6 and C6-7 disc spaces on all pulse sequences, likely due to partial interbody ankylosis. No evidence of endplate destruction. The cervical spinal canal is relatively small on a congenital basis.   Cord: Moderate  spinal stenosis with cord flattening at C3-4. There is focal hyperintensity in the right aspect of the cord at this level (best seen on axial image 13/8), likely reflecting myelomalacia.   Posterior Fossa, vertebral arteries, paraspinal tissues: Visualized portions of the posterior fossa appear unremarkable.Bilateral vertebral artery flow voids. No significant paraspinal findings.   Disc levels:   C2-3: Mild disc bulging and uncinate spurring. No spinal stenosis or significant foraminal narrowing.   C3-4: Spondylosis with mild loss of disc height and posterior osteophytes covering diffusely bulging disc material. Mild bilateral facet hypertrophy. Moderate spinal stenosis with cord flattening and cord hyperintensity on the right as noted above. Moderate to severe foraminal narrowing bilaterally with probable bilateral C4 nerve root encroachment.   C4-5: Milder spondylosis at this level with disc bulging and bilateral uncinate spurring. Mild spinal stenosis without cord deformity. Mild foraminal narrowing bilaterally.   C5-6: Loss of disc height with mild disc bulging and suspected interbody ankylosis. Small central disc protrusion without significant spinal stenosis. Mild foraminal narrowing bilaterally.   C6-7: Mild disc bulging with suspected interbody ankylosis. No significant spinal stenosis or foraminal narrowing.   C7-T1: Mild bilateral facet hypertrophy. No spinal stenosis or significant foraminal narrowing.   MRI THORACIC SPINE FINDINGS   Alignment:  Straightening without focal angulation or listhesis.   Vertebrae: No acute or suspicious osseous findings. The thoracic spinal canal is relatively small on a congenital basis. As in the cervical spine, findings suggesting partial interbody ankylosis at T7-8 and T9-10. No evidence of discitis or osteomyelitis.   Cord: There is anterior and leftward displacement of the cord at T2-3 by bulky appearing asymmetric  calcification of the ligamentum flavum on the right. There is mild cord flattening and mild cord hyperintensity at this level, likely reflecting myelomalacia.   Paraspinal and other soft tissues: No significant paraspinal abnormalities.   Disc levels:   T1-2: Small left paracentral disc protrusion with asymmetric uncinate spurring on the right and mild bilateral facet hypertrophy. No significant spinal stenosis. Mild to moderate right foraminal narrowing.   T2-3: Mild disc bulging with anterior osteophytes. 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 Suspected mild myelomalacia at this level. Moderate right and mild left foraminal narrowing.   T3-4: The disc appears normal. 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 Mild mass effect on the thecal sac without cord deformity. Mild right foraminal narrowing.   Mild spondylosis at the  additional levels with disc bulging and endplate osteophytes. No other significant thoracic spinal stenosis or foraminal narrowing.   MRI LUMBAR SPINE FINDINGS   Segmentation: There are 5 lumbar type vertebral bodies.   Alignment:  Unchanged minimal degenerative anterolisthesis at L4-5.   Vertebrae: No worrisome osseous lesion, acute fracture or pars defect. The lumbar pedicles are short on a congenital basis. Partially ankylosing osteophytes of the sacroiliac joints bilaterally.   Conus medullaris: Extends to the L2 level and appears normal. There is tortuosity of the cauda equina nerve roots, attributed to spinal stenosis.   Paraspinal and other soft tissues: No significant paraspinal findings.   Disc levels:   L1-2: Normal interspace.   L2-3: Mild disc desiccation with mild bulging, endplate osteophytes and bilateral facet hypertrophy. Borderline  spinal stenosis. No significant foraminal narrowing.   L3-4: Mild loss of disc height with annular disc bulging and endplate osteophytes. Moderate facet and ligamentous hypertrophy. Resulting moderate multifactorial spinal stenosis with asymmetric narrowing of the right lateral recess. There is mild foraminal narrowing bilaterally.   L4-5: Preserved disc height with annular disc bulging and endplate osteophytes. Advanced facet and ligamentous hypertrophy. Resulting moderate multifactorial spinal stenosis with mild to moderate lateral recess and foraminal narrowing bilaterally.   L5-S1: Preserved disc height and hydration with mild disc bulging and endplate osteophyte formation. Mild to moderate facet and ligamentous hypertrophy with mild spinal stenosis and mild foraminal narrowing bilaterally.   IMPRESSION: 1. 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 Probable symptomatic findings at C3-4 and T2-3, further summarized below. 2. 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 Moderate to severe foraminal narrowing bilaterally at C3-4 with probable bilateral C4 nerve root encroachment. 3. Mild spinal stenosis and mild foraminal narrowing bilaterally at C4-5. 4. 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 Moderate right and mild left foraminal narrowing at this level. Consider CT to further evaluate the upper thoracic spine. 5. Moderate multifactorial spinal stenosis at L3-4 and L4-5 with lateral recess and foraminal narrowing as described, similar to recent prior CT. No acute findings identified in the lumbar spine. 6. These results will be called to the ordering clinician or representative by the Radiologist Assistant, and communication documented in the PACS or [employer].      Electronically Signed   By: Elsie Perone M.D.   On: 08/27/2024 16:48      I have personally reviewed the images and agree with the above interpretation.  Medical Decision Making/Assessment and Plan: Mr. Poke is a pleasant 58 y.o. male with rapidly progressive cervical and thoracic myelopathy.  He has had rapidly progressive worsening in his ambulation function.  Unsteadiness.  Feels like he is unable to walk well.  Has appeared unbalanced.  Has significant neck and back pain radiating down his back and into his bilateral arms and legs.  On physical examination he has some mild weakness noted in his hands and shoulders as well as his hips.  This seems to be in an upper motor neuron type pattern.  He also has some sensation changes as well.  On physical examination he does show hyperreflexia and some pathologic reflexes noted.  Is concern for progressive myelopathy secondary to his physical exam, history, and MRI findings consistent with severe C3-4 stenosis secondary to disc bulge with myelomalacia noted mostly on the right.  This does correlate with his right sided worsened  symptoms compared to his left.  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  We discussed a stepwise versus tandem operation.  Given his baseline cognitive status family felt it would be best to perform both of these procedures in tandem to help him avoid 2 separate recoveries.  He does get some anxiety with his surgical interventions or medical procedures, and they were somewhat concerned that doing them separately would add undue stress.  Will plan for a C3-4 anterior cervical discectomy and fusion as well as a T1-T4 posterior spinal instrumentation and fusion, and T2-3 right sided transpedicular decompression and laminectomy for spinal cord decompression.  We discussed alternatives as well  as the risks and benefits of this procedure.  Family would like to go forward with the procedure given his rapidly progressive difficulty with ambulation.  Given his stature it is making quite difficult for his caretakers to help care for him and for him to help himself ambulate with assistance of his arms and the walker.  Thank you for involving me in the care of this patient.    [name] MD/MSCR Neurosurgery

## 2024-09-02 NOTE — H&P (View-Only) (Signed)
 Referring Physician:  Liana Fish, NP [address] [place],  [place] [address]  Primary Physician:  Liana Fish, NP  History of Present Illness: 09/04/24 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  He has been having progressive weakness in his bilateral lower extremities with worsening gait and balance.  He has also noticed worsening function in his hands and coordination.  His family is quite concerned that they have noticed his balance has gotten significantly worse rapidly.  He was recently seen by [facility] and was referred for evaluation.  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  Has been over the course of the past 2 to 3 months he has had progressive back pain and posterior leg pain and numbness.  He was walking with a cane but now he started to walk with a rollator.  He gets some relief when he rests.  They do feel like this is progressing quite rapidly.    Conservative measures:  Physical therapy: none Multimodal medical therapy including regular antiinflammatories:  hydrocodone , meloxicam ,  Injections:  no epidural steroid injections   Past Surgery: no spinal surgeries    Edwin [name] has gait/balance issues.   Review of Systems:  A 10 point review of systems is negative, except for the pertinent positives and negatives detailed in the HPI.  Past Medical History: Past Medical History:  Diagnosis Date   Diabetes mellitus without complication (HCC)    Hypercholesteremia    Medical history non-contributory     Past Surgical History: Past Surgical History:  Procedure Laterality Date   BIOPSY  11/23/2023   Procedure: BIOPSY;  Surgeon: Therisa Bi, MD;  Location: [facility] ENDOSCOPY;  Service: Gastroenterology;;   COLONOSCOPY N/A 04/17/2024   Procedure: COLONOSCOPY;  Surgeon: Therisa Bi, MD;  Location: [facility] ENDOSCOPY;  Service:  Gastroenterology;  Laterality: N/A;   COLONOSCOPY WITH PROPOFOL  N/A 11/23/2023   Procedure: COLONOSCOPY WITH PROPOFOL ;  Surgeon: Therisa Bi, MD;  Location: [facility] ENDOSCOPY;  Service: Gastroenterology;  Laterality: N/A;   ESOPHAGOGASTRODUODENOSCOPY (EGD) WITH PROPOFOL      ESOPHAGOGASTRODUODENOSCOPY (EGD) WITH PROPOFOL  N/A 11/23/2023   Procedure: ESOPHAGOGASTRODUODENOSCOPY (EGD) WITH PROPOFOL ;  Surgeon: Therisa Bi, MD;  Location: [facility] ENDOSCOPY;  Service: Gastroenterology;  Laterality: N/A;   POLYPECTOMY  11/23/2023   Procedure: POLYPECTOMY;  Surgeon: Therisa Bi, MD;  Location: [facility] ENDOSCOPY;  Service: Gastroenterology;;   POLYPECTOMY  04/17/2024   Procedure: POLYPECTOMY, INTESTINE;  Surgeon: Therisa Bi, MD;  Location: [facility] ENDOSCOPY;  Service: Gastroenterology;;    Allergies: Allergies as of 09/04/2024 - Review Complete 09/04/2024  Allergen Reaction Noted   Citrullus vulgaris Hives 04/23/2014    Medications: No current facility-administered medications for this visit. No current outpatient medications on file.  Facility-Administered Medications Ordered in Other Visits:    [MAR Hold] acetaminophen  (TYLENOL ) tablet 650 mg, 650 mg, Oral, Q6H PRN, 650 mg at 09/09/24 2126 **OR** [MAR Hold] acetaminophen  (TYLENOL ) suppository 650 mg, 650 mg, Rectal, Q6H PRN, Cox, Amy N, DO   [MAR Hold] amantadine  (SYMMETREL ) capsule 100 mg, 100 mg, Oral, BID, Cox, Amy N, DO, 100 mg at 09/11/24 2146   [facility] Hold] artificial tears ophthalmic solution 1 drop, 1 drop, Both Eyes, BID, Awanda City, MD, 1 drop at 09/11/24 1538   [MAR Hold] bisacodyl (DULCOLAX) suppository 10 mg, 10 mg, Rectal, Daily PRN, Jhonny, Sudheer B, MD, 10 mg at 09/10/24 1211   ceFAZolin (ANCEF) IVPB 3g/150  mL premix, 3 g, Intravenous, Once, Claudene Penne ORN, MD   [facility] Hold] clonazePAM (KLONOPIN) tablet 0.5 mg, 0.5 mg, Oral, QHS PRN, Cox, Amy N, DO, 0.5 mg at 09/09/24 0131   [MAR Hold] docusate sodium  (COLACE) capsule 100 mg, 100 mg, Oral,  TID, Cox, Amy N, DO, 100 mg at 09/11/24 2146   [facility] Hold] escitalopram (LEXAPRO) tablet 10 mg, 10 mg, Oral, Daily, Cox, Amy N, DO, 10 mg at 09/11/24 0925   [MAR Hold] loratadine (CLARITIN) tablet 10 mg, 10 mg, Oral, Daily, Awanda City, MD, 10 mg at 09/11/24 1514   [MAR Hold] ondansetron  (ZOFRAN ) tablet 4 mg, 4 mg, Oral, Q6H PRN **OR** [MAR Hold] ondansetron  (ZOFRAN ) injection 4 mg, 4 mg, Intravenous, Q6H PRN, Cox, Amy N, DO   [MAR Hold] oxyCODONE (Oxy IR/ROXICODONE) immediate release tablet 5-10 mg, 5-10 mg, Oral, Q4H PRN, Jhonny, Sudheer B, MD, 5 mg at 09/10/24 0006   [MAR Hold] pantoprazole (PROTONIX) EC tablet 40 mg, 40 mg, Oral, Daily, Cox, Amy N, DO, 40 mg at 09/11/24 0924   [MAR Hold] risperiDONE (RISPERDAL) tablet 3 mg, 3 mg, Oral, QHS, Cox, Amy N, DO, 3 mg at 09/11/24 2146   [facility] Hold] senna-docusate (Senokot-S) tablet 1 tablet, 1 tablet, Oral, BID, Sreenath, Sudheer B, MD, 1 tablet at 09/11/24 2146   [facility] Hold] simvastatin  (ZOCOR ) tablet 20 mg, 20 mg, Oral, Daily, Cox, Amy N, DO, 20 mg at 09/11/24 0925   [MAR Hold] tamsulosin  (FLOMAX ) capsule 0.4 mg, 0.4 mg, Oral, Daily, Cox, Amy N, DO, 0.4 mg at 09/11/24 0925   [MAR Hold] thioridazine (MELLARIL) tablet 100 mg, 100 mg, Oral, BID, Greenwood, Howard F, RPH, 100 mg at 09/11/24 2146   vancomycin (VANCOCIN) IVPB 1000 mg/200 mL premix, 1,000 mg, Intravenous, Once, Claudene Penne ORN, MD, Last Rate: 200 mL/hr at 09/12/24 0857, 1,000 mg at 09/12/24 0857  Social History: Social History   Tobacco Use   Smoking status: Never   Smokeless tobacco: Never  Vaping Use   Vaping status: Never Used  Substance Use Topics   Alcohol use: No   Drug use: No    Family Medical History: Family History  Problem Relation Age of Onset   Diabetes Father    Hypertension Father    Hypertension Sister    Diabetes Sister     Physical Examination: Vitals:   09/04/24 1339  BP: 134/84    General: Patient is in no apparent distress. Attention to examination  is appropriate.  Neck:   Supple.  Full range of motion.  Respiratory: Patient is breathing without any difficulty.   NEUROLOGICAL:     Awake, alert, oriented to person, place, and time.  Speech is clear and fluent.   Cranial Nerves: Pupils equal round and reactive to light.  Facial tone is symmetric.  Facial sensation is symmetric. Shoulder shrug is symmetric. Tongue protrusion is midline.    Strength: Side Biceps Triceps Deltoid Interossei Grip Wrist Ext. Wrist Flex.  R 5 5 4+ 4 4 5 5   L 5 5 4+ 4 4 5 5    Side Iliopsoas Quads Hamstring PF DF EHL  R 4 5 5 5 5 5   L 4 5 5 5 5 5    He does have evidence of hyperreflexia, he does have some difficulty relaxing for his reflexes.  Notable Tromner reflex bilaterally.  His gait has gotten rapidly worse.  He was previously ambulating without assistance then over the past few months which to a cane and progressed to needing a  rollator.   Imaging: Narrative & Impression  CLINICAL DATA:  Gait instability and hyper reflexia. Ongoing low back pain radiating into both legs. No known injury or prior relevant surgery. Recent clinical evaluation by neuro surgery on 08/19/2024.   EXAM: MRI CERVICAL, THORACIC AND LUMBAR SPINE WITHOUT CONTRAST   TECHNIQUE: Multiplanar and multiecho pulse sequences of the cervical spine, to include the craniocervical junction and cervicothoracic junction, and thoracic and lumbar spine, were obtained without intravenous contrast.   COMPARISON:  CT lumbar spine 08/13/2024, abdominopelvic CT 07/23/2024 and thoracic spine radiographs 09/22/2005.   FINDINGS: MRI CERVICAL SPINE FINDINGS   Alignment: Physiologic.   Vertebrae: No acute or suspicious osseous findings. Mild hyperintensity within the C5-6 and C6-7 disc spaces on all pulse sequences, likely due to partial interbody ankylosis. No evidence of endplate destruction. The cervical spinal canal is relatively small on a congenital basis.   Cord: Moderate  spinal stenosis with cord flattening at C3-4. There is focal hyperintensity in the right aspect of the cord at this level (best seen on axial image 13/8), likely reflecting myelomalacia.   Posterior Fossa, vertebral arteries, paraspinal tissues: Visualized portions of the posterior fossa appear unremarkable.Bilateral vertebral artery flow voids. No significant paraspinal findings.   Disc levels:   C2-3: Mild disc bulging and uncinate spurring. No spinal stenosis or significant foraminal narrowing.   C3-4: Spondylosis with mild loss of disc height and posterior osteophytes covering diffusely bulging disc material. Mild bilateral facet hypertrophy. Moderate spinal stenosis with cord flattening and cord hyperintensity on the right as noted above. Moderate to severe foraminal narrowing bilaterally with probable bilateral C4 nerve root encroachment.   C4-5: Milder spondylosis at this level with disc bulging and bilateral uncinate spurring. Mild spinal stenosis without cord deformity. Mild foraminal narrowing bilaterally.   C5-6: Loss of disc height with mild disc bulging and suspected interbody ankylosis. Small central disc protrusion without significant spinal stenosis. Mild foraminal narrowing bilaterally.   C6-7: Mild disc bulging with suspected interbody ankylosis. No significant spinal stenosis or foraminal narrowing.   C7-T1: Mild bilateral facet hypertrophy. No spinal stenosis or significant foraminal narrowing.   MRI THORACIC SPINE FINDINGS   Alignment:  Straightening without focal angulation or listhesis.   Vertebrae: No acute or suspicious osseous findings. The thoracic spinal canal is relatively small on a congenital basis. As in the cervical spine, findings suggesting partial interbody ankylosis at T7-8 and T9-10. No evidence of discitis or osteomyelitis.   Cord: There is anterior and leftward displacement of the cord at T2-3 by bulky appearing asymmetric  calcification of the ligamentum flavum on the right. There is mild cord flattening and mild cord hyperintensity at this level, likely reflecting myelomalacia.   Paraspinal and other soft tissues: No significant paraspinal abnormalities.   Disc levels:   T1-2: Small left paracentral disc protrusion with asymmetric uncinate spurring on the right and mild bilateral facet hypertrophy. No significant spinal stenosis. Mild to moderate right foraminal narrowing.   T2-3: Mild disc bulging with anterior osteophytes. 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 Suspected mild myelomalacia at this level. Moderate right and mild left foraminal narrowing.   T3-4: The disc appears normal. 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 Mild mass effect on the thecal sac without cord deformity. Mild right foraminal narrowing.   Mild spondylosis at the  additional levels with disc bulging and endplate osteophytes. No other significant thoracic spinal stenosis or foraminal narrowing.   MRI LUMBAR SPINE FINDINGS   Segmentation: There are 5 lumbar type vertebral bodies.   Alignment:  Unchanged minimal degenerative anterolisthesis at L4-5.   Vertebrae: No worrisome osseous lesion, acute fracture or pars defect. The lumbar pedicles are short on a congenital basis. Partially ankylosing osteophytes of the sacroiliac joints bilaterally.   Conus medullaris: Extends to the L2 level and appears normal. There is tortuosity of the cauda equina nerve roots, attributed to spinal stenosis.   Paraspinal and other soft tissues: No significant paraspinal findings.   Disc levels:   L1-2: Normal interspace.   L2-3: Mild disc desiccation with mild bulging, endplate osteophytes and bilateral facet hypertrophy. Borderline  spinal stenosis. No significant foraminal narrowing.   L3-4: Mild loss of disc height with annular disc bulging and endplate osteophytes. Moderate facet and ligamentous hypertrophy. Resulting moderate multifactorial spinal stenosis with asymmetric narrowing of the right lateral recess. There is mild foraminal narrowing bilaterally.   L4-5: Preserved disc height with annular disc bulging and endplate osteophytes. Advanced facet and ligamentous hypertrophy. Resulting moderate multifactorial spinal stenosis with mild to moderate lateral recess and foraminal narrowing bilaterally.   L5-S1: Preserved disc height and hydration with mild disc bulging and endplate osteophyte formation. Mild to moderate facet and ligamentous hypertrophy with mild spinal stenosis and mild foraminal narrowing bilaterally.   IMPRESSION: 1. 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 Probable symptomatic findings at C3-4 and T2-3, further summarized below. 2. 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 Moderate to severe foraminal narrowing bilaterally at C3-4 with probable bilateral C4 nerve root encroachment. 3. Mild spinal stenosis and mild foraminal narrowing bilaterally at C4-5. 4. 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 Moderate right and mild left foraminal narrowing at this level. Consider CT to further evaluate the upper thoracic spine. 5. Moderate multifactorial spinal stenosis at L3-4 and L4-5 with lateral recess and foraminal narrowing as described, similar to recent prior CT. No acute findings identified in the lumbar spine. 6. These results will be called to the ordering clinician or representative by the Radiologist Assistant, and communication documented in the PACS or [employer].      Electronically Signed   By: Elsie Perone M.D.   On: 08/27/2024 16:48      I have personally reviewed the images and agree with the above interpretation.  Medical Decision Making/Assessment and Plan: Mr. Poke is a pleasant 58 y.o. male with rapidly progressive cervical and thoracic myelopathy.  He has had rapidly progressive worsening in his ambulation function.  Unsteadiness.  Feels like he is unable to walk well.  Has appeared unbalanced.  Has significant neck and back pain radiating down his back and into his bilateral arms and legs.  On physical examination he has some mild weakness noted in his hands and shoulders as well as his hips.  This seems to be in an upper motor neuron type pattern.  He also has some sensation changes as well.  On physical examination he does show hyperreflexia and some pathologic reflexes noted.  Is concern for progressive myelopathy secondary to his physical exam, history, and MRI findings consistent with severe C3-4 stenosis secondary to disc bulge with myelomalacia noted mostly on the right.  This does correlate with his right sided worsened  symptoms compared to his left.  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  We discussed a stepwise versus tandem operation.  Given his baseline cognitive status family felt it would be best to perform both of these procedures in tandem to help him avoid 2 separate recoveries.  He does get some anxiety with his surgical interventions or medical procedures, and they were somewhat concerned that doing them separately would add undue stress.  Will plan for a C3-4 anterior cervical discectomy and fusion as well as a T1-T4 posterior spinal instrumentation and fusion, and T2-3 right sided transpedicular decompression and laminectomy for spinal cord decompression.  We discussed alternatives as well  as the risks and benefits of this procedure.  Family would like to go forward with the procedure given his rapidly progressive difficulty with ambulation.  Given his stature it is making quite difficult for his caretakers to help care for him and for him to help himself ambulate with assistance of his arms and the walker.  Thank you for involving me in the care of this patient.    [name] MD/MSCR Neurosurgery

## 2024-09-04 ENCOUNTER — Other Ambulatory Visit: Payer: Self-pay

## 2024-09-04 ENCOUNTER — Ambulatory Visit: Payer: Self-pay | Admitting: Neurosurgery

## 2024-09-04 ENCOUNTER — Ambulatory Visit
Admission: RE | Admit: 2024-09-04 | Discharge: 2024-09-04 | Disposition: A | Discharge status: Home / Self Care | Attending: Orthopedic Surgery | Admitting: Orthopedic Surgery

## 2024-09-04 ENCOUNTER — Ambulatory Visit
Admission: RE | Admit: 2024-09-04 | Discharge: 2024-09-04 | Disposition: A | Source: Ambulatory Visit | Attending: Orthopedic Surgery | Admitting: Orthopedic Surgery

## 2024-09-04 ENCOUNTER — Ambulatory Visit: Admitting: Neurosurgery

## 2024-09-04 VITALS — BP 134/84 | Ht 74.0 in | Wt 315.0 lb

## 2024-09-04 DIAGNOSIS — R2681 Unsteadiness on feet: Secondary | ICD-10-CM

## 2024-09-04 DIAGNOSIS — M4714 Other spondylosis with myelopathy, thoracic region: Secondary | ICD-10-CM

## 2024-09-04 DIAGNOSIS — M4802 Spinal stenosis, cervical region: Secondary | ICD-10-CM

## 2024-09-04 DIAGNOSIS — M4712 Other spondylosis with myelopathy, cervical region: Secondary | ICD-10-CM

## 2024-09-04 DIAGNOSIS — R269 Unspecified abnormalities of gait and mobility: Secondary | ICD-10-CM

## 2024-09-04 DIAGNOSIS — R29898 Other symptoms and signs involving the musculoskeletal system: Secondary | ICD-10-CM | POA: Insufficient documentation

## 2024-09-04 DIAGNOSIS — M5020 Other cervical disc displacement, unspecified cervical region: Secondary | ICD-10-CM

## 2024-09-04 DIAGNOSIS — R292 Abnormal reflex: Secondary | ICD-10-CM | POA: Diagnosis not present

## 2024-09-04 DIAGNOSIS — G959 Disease of spinal cord, unspecified: Secondary | ICD-10-CM

## 2024-09-04 DIAGNOSIS — R200 Anesthesia of skin: Secondary | ICD-10-CM

## 2024-09-04 DIAGNOSIS — M5001 Cervical disc disorder with myelopathy,  high cervical region: Secondary | ICD-10-CM

## 2024-09-04 DIAGNOSIS — M4804 Spinal stenosis, thoracic region: Secondary | ICD-10-CM

## 2024-09-04 DIAGNOSIS — Z01818 Encounter for other preprocedural examination: Secondary | ICD-10-CM

## 2024-09-04 DIAGNOSIS — M6788 Other specified disorders of synovium and tendon, other site: Secondary | ICD-10-CM

## 2024-09-04 NOTE — Patient Instructions (Signed)
 Please see below for information in regards to your upcoming surgery:   Planned surgery: C3-4 anterior cervical discectomy and fusion; T2-T3 right sided transpedicular decompression with laminectomy, T1-T4 posterior spinal instrumentation and fusion    Surgery date: 09/12/2024 at [facility] (Medical Mall: [address], [place], [place] [address]) - you will find out your arrival time the business day before your surgery.   Pre-op appointment at [facility] Pre-admit Testing: you will receive a call with a date/time for this appointment. If you are scheduled for an in person appointment, Pre-admit Testing is located on the first floor of the [facility] building, [address], [address]. During this appointment, they will advise you which medications you can take the morning of surgery, and which medications you will need to hold for surgery. Labs (such as blood work, EKG) may be done at your pre-op appointment. You are not required to fast for these labs. Should you need to change your pre-op appointment, please call Pre-admit testing at [phone].      Diabetes/heart failure/kidney disease/weight loss medications that require an extended hold: Per anesthesia guidelines (due to the increased risk of aspiration caused by delayed gastric emptying):  Empagliflozin  (Jardiance ): hold for 3 days prior to surgery     NSAIDS (Non-steroidal anti-inflammatory drugs): because you are having a fusion, please avoid taking any NSAIDS (examples: ibuprofen , motrin , aleve, naproxen, meloxicam , diclofenac) for 3 months after surgery. Celebrex is an exception and is OK to take, if prescribed. Tylenol  is not an NSAID.    Common restrictions after spine surgery: No bending, lifting, or twisting ("BLT"). Avoid lifting objects heavier than 10 pounds for the first 6 weeks after surgery. Where possible, avoid household activities that involve lifting, bending,  reaching, pushing, or pulling such as laundry, vacuuming, grocery shopping, and childcare. Try to arrange for help from friends and family for these activities while you heal. Do not drive while taking prescription pain medication. Weeks 6 through 12 after surgery: avoid lifting more than 25 pounds.    X-rays after surgery: Because you are having a fusion: for appointments after your 2 week follow-up: please arrive at the [facility] outpatient imaging center ([address], [address], [place]) or [employer] one hour prior to your appointment for x-rays. This applies to every appointment after your 2 week follow-up. Failure to do so may result in your appointment being rescheduled. *We recently started construction to have x-ray in our office. This may be completed by the time you come in for your 6 week post-op appointment. Please check with us  closer to that time to see if you can have your x-rays at our office*    How to contact us :  If you have any questions/concerns before or after surgery, you can reach us  at [phone], or you can send a mychart message. We can be reached by phone or mychart 8am-4pm, Monday-Friday.  *Please note: Calls after 4pm are forwarded to a third party answering service. Mychart messages are not routinely monitored during evenings, weekends, and holidays. Please call our office to contact the answering service for urgent concerns during non-business hours.    If you have FMLA/disability paperwork, please drop it off or fax it to [phone]   Appointments/FMLA & disability paperwork: [employer] Registered Nurse/Surgery scheduler: Yiannis Tulloch, RN Certified Medical Assistants: Don, CMA, Elenor, CMA, & Damien, CMA Physician Assistants: Lyle Decamp, PA-C, Edsel Goods, PA-C & Glade Boys, PA-C Surgeons: Penne Sharps, MD &  [name], MD   [facility] PREADMIT TESTING VISIT and SURGERY INFORMATION  SHEET   Now that surgery has been scheduled you can anticipate several phone calls from [facility] services. A pharmacy technician will call you to verify your current list of medications taken at home.               The Pre-Service Center will call to verify your insurance information and to give you billing estimates and information.             The Preadmit Testing Office will be calling to schedule a visit to obtain information for the anesthesia team and provide instructions on preparation for surgery.  What can you expect for the Preadmit Testing Visit: Appointments may be scheduled in-person or by telephone.  If a telephone visit is scheduled, you may be asked to come into the office to have lab tests or other studies performed.   This visit will not be completed any greater than 14 days prior to your surgery.  If your surgery has been scheduled for a future date, please do not be alarmed if we have not contacted you to schedule an appointment more than a month prior to the surgery date.    Please be prepared to provide the following information during this appointment:            -Personal medical history                                               -Medication and allergy list            -Any history of problems with anesthesia              -Recent lab work or diagnostic studies            -Please notify us  of any needs we should be aware of to provide the best care possible           -You will be provided with instructions on how to prepare for your surgery.    On The Day of Surgery:  You must have a driver to take you home after surgery, you will be asked not to drive for 24 hours following surgery.  Taxi, Gisele and non-medical transport will not be acceptable means of transportation unless you have a responsible individual who will be traveling with you.  Visitors in the surgical area:   2 people will be able to visit you in your room once your preparation for surgery has been  completed. During surgery, your visitors will be asked to wait in the Surgery Waiting Area.  It is not a requirement for them to stay, if they prefer to leave and come back.  Your visitor(s) will be given an update once the surgery has been completed.  No visitors are allowed in the initial recovery room to respect patient privacy and safety.  Once you are more awake and transfer to the secondary recovery area, or are transferred to an inpatient room, visitors will again be able to see you.  To respect and protect your privacy: We will ask on the day of surgery who your driver will be and what the contact number for that individual will be. We will ask if it is okay to share information with this individual, or if there  is an alternative individual that we, or the surgeon, should contact to provide updates and information. If family or friends come to the surgical information desk requesting information about you, who you have not listed with us , no information will be given.   It may be helpful to designate someone as the main contact who will be responsible for updating your other friends and family.    PREADMIT TESTING OFFICE: [phone] SAME DAY SURGERY: [phone] We look forward to caring for you before and throughout the process of your surgery.

## 2024-09-06 ENCOUNTER — Telehealth: Payer: Self-pay | Admitting: Nurse Practitioner

## 2024-09-06 ENCOUNTER — Other Ambulatory Visit: Payer: Self-pay | Admitting: Nurse Practitioner

## 2024-09-06 DIAGNOSIS — M48062 Spinal stenosis, lumbar region with neurogenic claudication: Secondary | ICD-10-CM

## 2024-09-06 DIAGNOSIS — M4716 Other spondylosis with myelopathy, lumbar region: Secondary | ICD-10-CM

## 2024-09-06 MED ORDER — HYDROCODONE-ACETAMINOPHEN 5-325 MG PO TABS: 1.0000 | ORAL_TABLET | Freq: Three times a day (TID) | ORAL | 0 refills | Status: DC | PRN

## 2024-09-06 NOTE — Telephone Encounter (Signed)
 [employer] FMLA paperwork completed. Faxed back to [employer]; [phone]. Scanned. Returned form to Melecia-Toni

## 2024-09-08 ENCOUNTER — Encounter: Payer: Self-pay | Admitting: Nurse Practitioner

## 2024-09-08 ENCOUNTER — Emergency Department

## 2024-09-08 ENCOUNTER — Inpatient Hospital Stay
Admission: EM | Admit: 2024-09-08 | Discharge: 2024-09-24 | DRG: 447 | Disposition: A | Discharge status: Skilled Nursing Facility | Attending: Obstetrics and Gynecology | Admitting: Obstetrics and Gynecology

## 2024-09-08 ENCOUNTER — Other Ambulatory Visit: Payer: Self-pay

## 2024-09-08 DIAGNOSIS — E782 Mixed hyperlipidemia: Secondary | ICD-10-CM | POA: Diagnosis present

## 2024-09-08 DIAGNOSIS — G4730 Sleep apnea, unspecified: Secondary | ICD-10-CM | POA: Diagnosis present

## 2024-09-08 DIAGNOSIS — M5001 Cervical disc disorder with myelopathy,  high cervical region: Secondary | ICD-10-CM | POA: Diagnosis not present

## 2024-09-08 DIAGNOSIS — F22 Delusional disorders: Secondary | ICD-10-CM | POA: Diagnosis present

## 2024-09-08 DIAGNOSIS — M47812 Spondylosis without myelopathy or radiculopathy, cervical region: Secondary | ICD-10-CM | POA: Diagnosis present

## 2024-09-08 DIAGNOSIS — M4714 Other spondylosis with myelopathy, thoracic region: Principal | ICD-10-CM | POA: Diagnosis present

## 2024-09-08 DIAGNOSIS — N179 Acute kidney failure, unspecified: Secondary | ICD-10-CM | POA: Diagnosis present

## 2024-09-08 DIAGNOSIS — Z6841 Body Mass Index (BMI) 40.0 and over, adult: Secondary | ICD-10-CM

## 2024-09-08 DIAGNOSIS — E785 Hyperlipidemia, unspecified: Secondary | ICD-10-CM | POA: Diagnosis present

## 2024-09-08 DIAGNOSIS — E1122 Type 2 diabetes mellitus with diabetic chronic kidney disease: Secondary | ICD-10-CM | POA: Diagnosis present

## 2024-09-08 DIAGNOSIS — Z7984 Long term (current) use of oral hypoglycemic drugs: Secondary | ICD-10-CM

## 2024-09-08 DIAGNOSIS — M4712 Other spondylosis with myelopathy, cervical region: Secondary | ICD-10-CM | POA: Diagnosis present

## 2024-09-08 DIAGNOSIS — M48062 Spinal stenosis, lumbar region with neurogenic claudication: Secondary | ICD-10-CM | POA: Diagnosis present

## 2024-09-08 DIAGNOSIS — Z7901 Long term (current) use of anticoagulants: Secondary | ICD-10-CM

## 2024-09-08 DIAGNOSIS — T148XXA Other injury of unspecified body region, initial encounter: Secondary | ICD-10-CM | POA: Diagnosis not present

## 2024-09-08 DIAGNOSIS — I2699 Other pulmonary embolism without acute cor pulmonale: Secondary | ICD-10-CM | POA: Diagnosis not present

## 2024-09-08 DIAGNOSIS — M5020 Other cervical disc displacement, unspecified cervical region: Secondary | ICD-10-CM | POA: Diagnosis present

## 2024-09-08 DIAGNOSIS — N1832 Chronic kidney disease, stage 3b: Secondary | ICD-10-CM | POA: Diagnosis present

## 2024-09-08 DIAGNOSIS — R296 Repeated falls: Secondary | ICD-10-CM | POA: Diagnosis present

## 2024-09-08 DIAGNOSIS — I82441 Acute embolism and thrombosis of right tibial vein: Secondary | ICD-10-CM | POA: Diagnosis present

## 2024-09-08 DIAGNOSIS — D62 Acute posthemorrhagic anemia: Secondary | ICD-10-CM | POA: Diagnosis not present

## 2024-09-08 DIAGNOSIS — E66813 Obesity, class 3: Secondary | ICD-10-CM | POA: Diagnosis present

## 2024-09-08 DIAGNOSIS — R29898 Other symptoms and signs involving the musculoskeletal system: Secondary | ICD-10-CM | POA: Diagnosis present

## 2024-09-08 DIAGNOSIS — F840 Autistic disorder: Secondary | ICD-10-CM | POA: Diagnosis present

## 2024-09-08 DIAGNOSIS — M4804 Spinal stenosis, thoracic region: Secondary | ICD-10-CM | POA: Diagnosis present

## 2024-09-08 DIAGNOSIS — I2609 Other pulmonary embolism with acute cor pulmonale: Secondary | ICD-10-CM | POA: Diagnosis not present

## 2024-09-08 DIAGNOSIS — Z9889 Other specified postprocedural states: Secondary | ICD-10-CM | POA: Diagnosis not present

## 2024-09-08 DIAGNOSIS — E860 Dehydration: Secondary | ICD-10-CM | POA: Diagnosis present

## 2024-09-08 DIAGNOSIS — Z01818 Encounter for other preprocedural examination: Secondary | ICD-10-CM

## 2024-09-08 DIAGNOSIS — M4800 Spinal stenosis, site unspecified: Secondary | ICD-10-CM | POA: Diagnosis not present

## 2024-09-08 DIAGNOSIS — F259 Schizoaffective disorder, unspecified: Secondary | ICD-10-CM | POA: Diagnosis present

## 2024-09-08 DIAGNOSIS — Z8249 Family history of ischemic heart disease and other diseases of the circulatory system: Secondary | ICD-10-CM

## 2024-09-08 DIAGNOSIS — R269 Unspecified abnormalities of gait and mobility: Secondary | ICD-10-CM

## 2024-09-08 DIAGNOSIS — M5104 Intervertebral disc disorders with myelopathy, thoracic region: Secondary | ICD-10-CM | POA: Diagnosis not present

## 2024-09-08 DIAGNOSIS — N1831 Chronic kidney disease, stage 3a: Secondary | ICD-10-CM | POA: Diagnosis present

## 2024-09-08 DIAGNOSIS — E876 Hypokalemia: Secondary | ICD-10-CM | POA: Diagnosis present

## 2024-09-08 DIAGNOSIS — M4802 Spinal stenosis, cervical region: Secondary | ICD-10-CM | POA: Diagnosis present

## 2024-09-08 DIAGNOSIS — K219 Gastro-esophageal reflux disease without esophagitis: Secondary | ICD-10-CM | POA: Diagnosis present

## 2024-09-08 DIAGNOSIS — I2692 Saddle embolus of pulmonary artery without acute cor pulmonale: Secondary | ICD-10-CM | POA: Diagnosis not present

## 2024-09-08 DIAGNOSIS — R200 Anesthesia of skin: Secondary | ICD-10-CM | POA: Diagnosis present

## 2024-09-08 DIAGNOSIS — Z833 Family history of diabetes mellitus: Secondary | ICD-10-CM

## 2024-09-08 DIAGNOSIS — R627 Adult failure to thrive: Secondary | ICD-10-CM | POA: Diagnosis present

## 2024-09-08 DIAGNOSIS — Z79899 Other long term (current) drug therapy: Secondary | ICD-10-CM | POA: Diagnosis not present

## 2024-09-08 DIAGNOSIS — Y92009 Unspecified place in unspecified non-institutional (private) residence as the place of occurrence of the external cause: Principal | ICD-10-CM

## 2024-09-08 DIAGNOSIS — I82409 Acute embolism and thrombosis of unspecified deep veins of unspecified lower extremity: Secondary | ICD-10-CM | POA: Diagnosis not present

## 2024-09-08 DIAGNOSIS — W19XXXA Unspecified fall, initial encounter: Principal | ICD-10-CM

## 2024-09-08 DIAGNOSIS — G549 Nerve root and plexus disorder, unspecified: Secondary | ICD-10-CM

## 2024-09-08 DIAGNOSIS — Z7409 Other reduced mobility: Secondary | ICD-10-CM

## 2024-09-08 LAB — CBC WITH DIFFERENTIAL/PLATELET
Abs Immature Granulocytes: 0.01 K/uL (ref 0.00–0.07)
Basophils Absolute: 0 K/uL (ref 0.0–0.1)
Basophils Relative: 1 %
Eosinophils Absolute: 0.1 K/uL (ref 0.0–0.5)
Eosinophils Relative: 2 %
HCT: 49.4 % (ref 39.0–52.0)
Hemoglobin: 15.4 g/dL (ref 13.0–17.0)
Immature Granulocytes: 0 %
Lymphocytes Relative: 26 %
Lymphs Abs: 1.3 K/uL (ref 0.7–4.0)
MCH: 27.1 pg (ref 26.0–34.0)
MCHC: 31.2 g/dL (ref 30.0–36.0)
MCV: 87 fL (ref 80.0–100.0)
Monocytes Absolute: 0.5 K/uL (ref 0.1–1.0)
Monocytes Relative: 10 %
Neutro Abs: 3 K/uL (ref 1.7–7.7)
Neutrophils Relative %: 61 %
Platelets: 198 K/uL (ref 150–400)
RBC: 5.68 MIL/uL (ref 4.22–5.81)
RDW: 13.4 % (ref 11.5–15.5)
WBC: 4.9 K/uL (ref 4.0–10.5)
nRBC: 0 % (ref 0.0–0.2)

## 2024-09-08 LAB — COMPREHENSIVE METABOLIC PANEL WITH GFR
ALT: 20 U/L (ref 0–44)
AST: 20 U/L (ref 15–41)
Albumin: 3.3 g/dL — ABNORMAL LOW (ref 3.5–5.0)
Alkaline Phosphatase: 104 U/L (ref 38–126)
Anion gap: 9 (ref 5–15)
BUN: 22 mg/dL — ABNORMAL HIGH (ref 6–20)
CO2: 27 mmol/L (ref 22–32)
Calcium: 8.9 mg/dL (ref 8.9–10.3)
Chloride: 104 mmol/L (ref 98–111)
Creatinine, Ser: 1.75 mg/dL — ABNORMAL HIGH (ref 0.61–1.24)
GFR, Estimated: 44 mL/min — ABNORMAL LOW (ref >60)
Glucose, Bld: 98 mg/dL (ref 70–99)
Potassium: 4.1 mmol/L (ref 3.5–5.1)
Sodium: 140 mmol/L (ref 135–145)
Total Bilirubin: 0.7 mg/dL (ref 0.0–1.2)
Total Protein: 6.9 g/dL (ref 6.5–8.1)

## 2024-09-08 LAB — URINALYSIS, ROUTINE W REFLEX MICROSCOPIC
Bacteria, UA: NONE SEEN
Bilirubin Urine: NEGATIVE
Glucose, UA: >=500 mg/dL — AB
Hgb urine dipstick: NEGATIVE
Ketones, ur: NEGATIVE mg/dL
Leukocytes,Ua: NEGATIVE
Nitrite: NEGATIVE
Protein, ur: NEGATIVE mg/dL
Specific Gravity, Urine: 1.033 — ABNORMAL HIGH (ref 1.005–1.030)
Squamous Epithelial / HPF: 0 /HPF (ref 0–5)
pH: 6 (ref 5.0–8.0)

## 2024-09-08 LAB — GLUCOSE, CAPILLARY: Glucose-Capillary: 109 mg/dL — ABNORMAL HIGH (ref 70–99)

## 2024-09-08 MED ORDER — THIORIDAZINE HCL 50 MG PO TABS
100.0000 mg | ORAL_TABLET | Freq: Two times a day (BID) | ORAL | Status: DC
  Administered 2024-09-08: 100 mg via ORAL
  Filled 2024-09-08 (×3): qty 2

## 2024-09-08 MED ORDER — HYDROCODONE-ACETAMINOPHEN 5-325 MG PO TABS
1.0000 | ORAL_TABLET | Freq: Once | ORAL | Status: AC
  Administered 2024-09-08: 1 via ORAL
  Filled 2024-09-08: qty 1

## 2024-09-08 MED ORDER — TAMSULOSIN HCL 0.4 MG PO CAPS
0.4000 mg | ORAL_CAPSULE | Freq: Every day | ORAL | Status: DC
  Administered 2024-09-09 – 2024-09-24 (×14): 0.4 mg via ORAL
  Filled 2024-09-08 (×14): qty 1

## 2024-09-08 MED ORDER — RISPERIDONE 3 MG PO TABS
3.0000 mg | ORAL_TABLET | Freq: Every day | ORAL | Status: DC
  Administered 2024-09-08 – 2024-09-22 (×14): 3 mg via ORAL
  Filled 2024-09-08 (×17): qty 1

## 2024-09-08 MED ORDER — INSULIN ASPART 100 UNIT/ML IJ SOLN: 0.0000 [IU] | Freq: Every day | INTRAMUSCULAR | Status: DC

## 2024-09-08 MED ORDER — AMANTADINE HCL 100 MG PO CAPS
100.0000 mg | ORAL_CAPSULE | Freq: Two times a day (BID) | ORAL | Status: DC
Start: 2024-09-08 — End: 2024-09-22
  Administered 2024-09-08 – 2024-09-22 (×26): 100 mg via ORAL
  Filled 2024-09-08 (×31): qty 1

## 2024-09-08 MED ORDER — SENNOSIDES-DOCUSATE SODIUM 8.6-50 MG PO TABS: 1.0000 | ORAL_TABLET | Freq: Every evening | ORAL | Status: DC | PRN

## 2024-09-08 MED ORDER — OXYCODONE HCL 5 MG PO TABS: 5.0000 mg | ORAL_TABLET | Freq: Four times a day (QID) | ORAL | Status: DC | PRN

## 2024-09-08 MED ORDER — DOCUSATE SODIUM 100 MG PO CAPS
100.0000 mg | ORAL_CAPSULE | Freq: Three times a day (TID) | ORAL | Status: DC
  Administered 2024-09-08 – 2024-09-18 (×26): 100 mg via ORAL
  Filled 2024-09-08 (×27): qty 1

## 2024-09-08 MED ORDER — INSULIN ASPART 100 UNIT/ML IJ SOLN
0.0000 [IU] | Freq: Three times a day (TID) | INTRAMUSCULAR | Status: DC
  Administered 2024-09-09 – 2024-09-10 (×3): 1 [IU] via SUBCUTANEOUS
  Administered 2024-09-11: 2 [IU] via SUBCUTANEOUS
  Filled 2024-09-08 (×4): qty 1

## 2024-09-08 MED ORDER — HEPARIN SODIUM (PORCINE) 5000 UNIT/ML IJ SOLN
5000.0000 [IU] | Freq: Three times a day (TID) | INTRAMUSCULAR | Status: DC
  Administered 2024-09-08 – 2024-09-11 (×9): 5000 [IU] via SUBCUTANEOUS
  Filled 2024-09-08 (×10): qty 1

## 2024-09-08 MED ORDER — ESCITALOPRAM OXALATE 10 MG PO TABS
10.0000 mg | ORAL_TABLET | Freq: Every day | ORAL | Status: DC
  Administered 2024-09-09 – 2024-09-24 (×14): 10 mg via ORAL
  Filled 2024-09-08 (×14): qty 1

## 2024-09-08 MED ORDER — PANTOPRAZOLE SODIUM 40 MG PO TBEC
40.0000 mg | DELAYED_RELEASE_TABLET | Freq: Every day | ORAL | Status: DC
  Administered 2024-09-09 – 2024-09-24 (×14): 40 mg via ORAL
  Filled 2024-09-08 (×14): qty 1

## 2024-09-08 MED ORDER — ONDANSETRON HCL 4 MG/2ML IJ SOLN
4.0000 mg | Freq: Four times a day (QID) | INTRAMUSCULAR | Status: AC | PRN
  Administered 2024-09-13: 4 mg via INTRAVENOUS
  Filled 2024-09-08: qty 2

## 2024-09-08 MED ORDER — SODIUM CHLORIDE 0.9 % IV BOLUS
500.0000 mL | Freq: Once | INTRAVENOUS | Status: AC
  Administered 2024-09-08: 500 mL via INTRAVENOUS

## 2024-09-08 MED ORDER — ACETAMINOPHEN 325 MG PO TABS
650.0000 mg | ORAL_TABLET | Freq: Four times a day (QID) | ORAL | Status: AC | PRN
  Administered 2024-09-09: 650 mg via ORAL
  Filled 2024-09-08: qty 2

## 2024-09-08 MED ORDER — CLONAZEPAM 0.5 MG PO TABS
0.5000 mg | ORAL_TABLET | Freq: Every evening | ORAL | Status: DC | PRN
Start: 2024-09-08 — End: 2024-09-22
  Administered 2024-09-09: 0.5 mg via ORAL
  Filled 2024-09-08: qty 1

## 2024-09-08 MED ORDER — SIMVASTATIN 20 MG PO TABS
20.0000 mg | ORAL_TABLET | Freq: Every day | ORAL | Status: DC
  Administered 2024-09-09 – 2024-09-24 (×14): 20 mg via ORAL
  Filled 2024-09-08 (×14): qty 1

## 2024-09-08 MED ORDER — ONDANSETRON HCL 4 MG PO TABS
4.0000 mg | ORAL_TABLET | Freq: Four times a day (QID) | ORAL | Status: AC | PRN
Start: 2024-09-08 — End: 2024-09-13

## 2024-09-08 MED ORDER — ACETAMINOPHEN 650 MG RE SUPP: 650.0000 mg | Freq: Four times a day (QID) | RECTAL | Status: AC | PRN

## 2024-09-08 NOTE — Assessment & Plan Note (Signed)
 Suspect secondary to spinal stenosis Fall precaution, PT, OT, [facility] has been consulted

## 2024-09-08 NOTE — Assessment & Plan Note (Signed)
 Home risperidone 3 mg nightly, Thioridazine 100 mg p.o. twice daily, escitalopram 10 mg daily were resumed on admission

## 2024-09-08 NOTE — ED Provider Notes (Cosign Needed Addendum)
 [facility] Emergency Department Provider Note     Event Date/Time   First MD Initiated Contact with Patient 09/08/24 1607     (approximate)   History   Fall   HPI  Edwin [name] is a 58 y.o. male with a history of obesity, CKD, DM type II, HLD, schizoaffective disorder, and autism spectrum disorder, who presents to the ED via EMS.  Patient is accompanied by his sister/legal guardian and his group home manager.  Patient apparently had a mechanical fall this morning out of his bed.  Patient was out of the bed, when he reached forward, tipping forward and landed on his knees.  He denies any head injury or LOC.  Patient is currently scheduled for C3-4 ACDF,  T2-T3 right sided transpedicular decompression with laminectomy, and T1-T4 posterior spinal instrumentation and fusion, with Dr. Penne Sharps for Thursday 09/12/24.  Patient reports patient has had significant decline in his mobility secondary to his primary RLE radicular symptoms.  Patient is scheduled for preop testing tomorrow morning, but the family voices concerns for the patient related to independent mobility and their ability to help transfer him.  This is a second fall this weekend, and they note that his spinal cord injury is so tenuous that a significant fall could result in significant and possibly permanent disability.  Physical Exam   Triage Vital Signs: ED Triage Vitals  Encounter Vitals Group     BP 09/08/24 1605 (!) 129/96     Girls Systolic BP Percentile --      Girls Diastolic BP Percentile --      Boys Systolic BP Percentile --      Boys Diastolic BP Percentile --      Pulse Rate 09/08/24 1605 66     Resp 09/08/24 1605 18     Temp 09/08/24 1605 98.2 F (36.8 C)     Temp Source 09/08/24 1605 Oral     SpO2 09/08/24 1605 99 %     Weight --      Height --      Head Circumference --      Peak Flow --      Pain Score 09/08/24 1603 0     Pain Loc --      Pain Education --       Exclude from Growth Chart --     Most recent vital signs: Vitals:   09/08/24 1605  BP: (!) 129/96  Pulse: 66  Resp: 18  Temp: 98.2 F (36.8 C)  SpO2: 99%    General Awake, no distress. NAD HEENT NCAT. PERRL. EOMI. No rhinorrhea. Mucous membranes are moist.  CV:  Good peripheral perfusion. RRR. No CCE distally RESP:  Normal effort. CTA ABD:  No distention.  MSK:  AROM of all extremities.  Right knee without obvious deformity, dislocation, or joint effusion.  Normal flexion extension range.  Normal patella tracking.  No ballottement appreciated.  No valgus or varus stress is elicited.  No evidence of internal derangement.  Left knee exam benign without evidence of internal derangement NEURO: Cranial nerves II to XII grossly intact.  Supine straight leg raise produces right anterior thigh pain with extension of the lower extremity.  Normal toe dorsiflexion normal foot eversion bilaterally.   ED Results / Procedures / Treatments   Labs (all labs ordered are listed, but only abnormal results are displayed) Labs Reviewed  COMPREHENSIVE METABOLIC PANEL WITH GFR - Abnormal; Notable for the following components:  Result Value   BUN 22 (*)    Creatinine, Ser 1.75 (*)    Albumin 3.3 (*)    GFR, Estimated 44 (*)    All other components within normal limits  CBC WITH DIFFERENTIAL/PLATELET  URINALYSIS, ROUTINE W REFLEX MICROSCOPIC     EKG   RADIOLOGY  I personally viewed and evaluated these images as part of my medical decision making, as well as reviewing the written report by the radiologist.  ED Provider Interpretation: No acute finding to the bilateral knees  DG Knee Complete 4 Views Left Result Date: 09/08/2024 CLINICAL DATA:  Fall from bed onto knees. EXAM: LEFT KNEE - COMPLETE 4+ VIEW; RIGHT KNEE - COMPLETE 4+ VIEW COMPARISON:  None Available. FINDINGS: Right knee: No evidence of fracture, dislocation, or joint effusion. Mild tricompartmental degenerative changes are  present. There are enthesopathic changes at the patella. The soft tissues are within normal limits. Left knee: There is no evidence of acute fracture or dislocation. Mild tricompartmental degenerative changes are noted. In the is a pathic changes are present at the patella. No joint effusion is seen. Chondrocalcinosis is noted. The soft tissues are within normal limits. IMPRESSION: No acute fracture or dislocation at the knees bilaterally. Electronically Signed   By: Leita Birmingham M.D.   On: 09/08/2024 16:55   DG Knee Complete 4 Views Right Result Date: 09/08/2024 CLINICAL DATA:  Fall from bed onto knees. EXAM: LEFT KNEE - COMPLETE 4+ VIEW; RIGHT KNEE - COMPLETE 4+ VIEW COMPARISON:  None Available. FINDINGS: Right knee: No evidence of fracture, dislocation, or joint effusion. Mild tricompartmental degenerative changes are present. There are enthesopathic changes at the patella. The soft tissues are within normal limits. Left knee: There is no evidence of acute fracture or dislocation. Mild tricompartmental degenerative changes are noted. In the is a pathic changes are present at the patella. No joint effusion is seen. Chondrocalcinosis is noted. The soft tissues are within normal limits. IMPRESSION: No acute fracture or dislocation at the knees bilaterally. Electronically Signed   By: Leita Birmingham M.D.   On: 09/08/2024 16:55     PROCEDURES:  Critical Care performed: No  Procedures   MEDICATIONS ORDERED IN ED: Medications  sodium chloride  0.9 % bolus 500 mL (has no administration in time range)  HYDROcodone -acetaminophen  (NORCO/VICODIN) 5-325 MG per tablet 1 tablet (1 tablet Oral Given 09/08/24 1641)     IMPRESSION / MDM / ASSESSMENT AND PLAN / ED COURSE  I reviewed the triage vital signs and the nursing notes.                              Differential diagnosis includes, but is not limited to, knee fracture, knee dislocation, OA, sprain, strain, radicular, neurogenic  claudication  Patient's presentation is most consistent with acute, uncomplicated illness.  ----------------------------------------- 5:11 PM on 09/08/2024 ----------------------------------------- S/W Dr. Belvie Felix (NeuroSx): While he is not involved with the patient's case, he was agreeable to the plan for admission given the patient's emergent need for neurosurgery intervention secondary to his spinal stenosis and neurogenic claudication.  Patient will be admitted to the hospital service for scheduled preop tomorrow ahead of his surgery on Thursday, 09/12/2024 with Dr. Penne Sharps.  Patient's diagnosis is consistent with acute decline in mobility secondary to RLE radiculopathy and neurogenic claudication secondary to lumbar spinal stenosis.  Patient presents to the ED accompanied by his sister/legal guardian, and group home manager, after mechanical fall in his  room this morning.  Patient denies any head injury, LOC.  No worsening of his pain, but continues to endorse RLE pain at the groin consistent with his known radiculopathy.  Family and group home manager report that the patient has apparently self-limited his ambulation and movement due to increased RLE pain.  Patient has also had decreased p.o. intake since he has to ambulate to the bathroom and this causes increased pain.  No bladder or bowel incontinence, foot drop, saddle anesthesia reported.  Patient reports some acute right greater than left knee pain following fall today.  X-ray images interpreted by me, showed no acute fracture or dislocation.  Patient will be admitted to the hospital service for declining mobility and pain related to his RLE radiculopathy.  Patient scheduled for preop tomorrow ahead of his surgery on Thursday for spinal decompression, discectomy, laminectomy  ----------------------------------------- 6:28 PM on 09/08/2024 ----------------------------------------- Patient will be admitted to the hospital service by  Dr. Greig Free.  She will consult neurosurgery ahead of his preop and pending procedure, as appropriate.    FINAL CLINICAL IMPRESSION(S) / ED DIAGNOSES   Final diagnoses:  Fall in home, initial encounter  Declining mobility  Neurogenic claudication due to lumbar spinal stenosis     Rx / DC Orders   ED Discharge Orders     None        Note:  This document was prepared using Dragon voice recognition software and may include unintentional dictation errors.    Loyd Candida LULLA Aldona, PA-C 09/08/24 1841    Arlander Charleston, MD 09/08/24 1848    Loyd Candida LULLA Aldona, PA-C 09/08/24 1850    Arlander Charleston, MD 09/10/24 [phone]

## 2024-09-08 NOTE — Assessment & Plan Note (Signed)
 At baseline

## 2024-09-08 NOTE — Assessment & Plan Note (Signed)
 CPAP nightly ordered

## 2024-09-08 NOTE — ED Notes (Signed)
 Advised nurse that patient has ready bed

## 2024-09-08 NOTE — Assessment & Plan Note (Addendum)
 With autism

## 2024-09-08 NOTE — Assessment & Plan Note (Signed)
 -  This complicates overall care and prognosis.

## 2024-09-08 NOTE — Assessment & Plan Note (Signed)
 Fall precaution.

## 2024-09-08 NOTE — Assessment & Plan Note (Signed)
 Patient has planned neurosurgery for 09/12/2024 Neurosurgery has been consulted

## 2024-09-08 NOTE — ED Triage Notes (Addendum)
 BIB [facility] from group home on [address] for a fall out of bed at 0700AM this morning. Pt slid out of the bed onto his knees. Pt is autistic. per family and caregiver he has surgery scheduled for thursday this week for spinal surgery. Pt is caox4, in no acute distress. Pt walks with a rolling walker. Pt mobility is decreasing rapidly leading up to this surgery. Pt denies any injuries.  BGL 104 90 HR  98% RA  162/90

## 2024-09-08 NOTE — H&P (Addendum)
 History and Physical   Edwin [name] FMW:[id number] DOB: Sep 07, 1966 DOA: 09/08/2024  PCP: Liana Fish, NP  Outpatient Specialists: Dr. Penne Sharps, neurosurgery Patient coming from: Home via EMS  I have personally briefly reviewed patient's old medical records in [facility] EMR.  Chief Concern: Falling  HPI: All [name] is a 58 year old male with history of GERD, depression, anxiety, hyperlipidemia, morbid obesity, who presents ED for chief concerns of falling episodes at group home.  Vitals in the ED showed t of 98.2, rr 18, heart rate 66, blood pressure 129/96, SpO2 99% on room air.  Serum sodium is 140, potassium 4.1, chloride 104, bicarb 27, BUN of 22, serum creatinine 1.75, eGFR 44, nonfasting blood glucose 98, WBC 4.9, hemoglobin 15.4, platelets of 198.  ED treatment: Norco 5-325 mg p.o. one-time dose, sodium chloride  500 mL liter bolus. --------------------------------- At bedside, patient able to tell me his first, middle, last name.  He states the year is 2024.  He was able to tell me his age accurately.  And identify his sister at bedside accurately.  He was not able to tell me if he is hurting anywhere.  He asked me if he can sometimes eat candy.  Sister reports that he was complaining of right sided groin pain and on palpation, I was not able to elicit any pain reaction.  Social history: Patient lives in a group home.  There is no reported use of tobacco, EtOH, recreational drug use.  He is disabled.  ROS: Unable to fully complete due to patient with autism, learning disability  ED Course: Discussed with EDP, patient requiring hospitalization for chief concerns of high risk of falling, falling episodes at group home.  Assessment/Plan  Principal Problem:   Falling episodes Active Problems:   Delusion (HCC)   Sleep apnea in adult   Moderate mixed hyperlipidemia not requiring statin therapy   Stage 3b chronic kidney disease (HCC)   Type 2 diabetes  mellitus with stage 3a chronic kidney disease, without long-term current use of insulin (HCC)   Hyperlipidemia   Schizoaffective disorder (HCC)   Gastroesophageal reflux disease without esophagitis   Abnormality of gait   Obesity, Class III, BMI 40-49.9 (morbid obesity) (HCC)   Cervical stenosis of spine   Assessment and Plan:  * Falling episodes Suspect secondary to spinal stenosis Fall precaution, PT, OT, [facility] Neurology has been consulted  Cervical stenosis of spine Patient has planned neurosurgery for 09/12/2024 Neurosurgery has been consulted  Obesity, Class III, BMI 40-49.9 (morbid obesity) (HCC) This complicates overall care and prognosis.   Abnormality of gait Fall precaution  Schizoaffective disorder (HCC) Home risperidone 3 mg nightly, Thioridazine 100 mg p.o. twice daily, escitalopram 10 mg daily were resumed on admission  Hyperlipidemia Home simvastatin  20 mg daily resume  Type 2 diabetes mellitus with stage 3a chronic kidney disease, without long-term current use of insulin (HCC) Home Jardiance  not resumed on admission Insulin SSI with at bedtime coverage, renal dosing ordered on admission  Stage 3b chronic kidney disease (HCC) At baseline  Sleep apnea in adult CPAP nightly ordered  Delusion (HCC) With autism  Chart reviewed.   DVT prophylaxis: Heparin 5000 units subcutaneous every 8 hours to stop on Wednesday, 09/11/2024 Code Status: Full code Diet: Heart healthy Family Communication: Updated sister, Deena at bedside. [name] is also the legal guardian.  Disposition Plan: Pending clinical course Consults called: Neurosurgery Admission status: MedSurg, inpatient  Past Medical History:  Diagnosis Date   Diabetes mellitus without complication (HCC)  Hypercholesteremia    Medical history non-contributory    Past Surgical History:  Procedure Laterality Date   BIOPSY  11/23/2023   Procedure: BIOPSY;  Surgeon: Therisa Bi, MD;  Location: [facility]  ENDOSCOPY;  Service: Gastroenterology;;   COLONOSCOPY N/A 04/17/2024   Procedure: COLONOSCOPY;  Surgeon: Therisa Bi, MD;  Location: [facility] ENDOSCOPY;  Service: Gastroenterology;  Laterality: N/A;   COLONOSCOPY WITH PROPOFOL  N/A 11/23/2023   Procedure: COLONOSCOPY WITH PROPOFOL ;  Surgeon: Therisa Bi, MD;  Location: [facility] ENDOSCOPY;  Service: Gastroenterology;  Laterality: N/A;   ESOPHAGOGASTRODUODENOSCOPY (EGD) WITH PROPOFOL      ESOPHAGOGASTRODUODENOSCOPY (EGD) WITH PROPOFOL  N/A 11/23/2023   Procedure: ESOPHAGOGASTRODUODENOSCOPY (EGD) WITH PROPOFOL ;  Surgeon: Therisa Bi, MD;  Location: [facility] ENDOSCOPY;  Service: Gastroenterology;  Laterality: N/A;   POLYPECTOMY  11/23/2023   Procedure: POLYPECTOMY;  Surgeon: Therisa Bi, MD;  Location: [facility] ENDOSCOPY;  Service: Gastroenterology;;   POLYPECTOMY  04/17/2024   Procedure: POLYPECTOMY, INTESTINE;  Surgeon: Therisa Bi, MD;  Location: [facility] ENDOSCOPY;  Service: Gastroenterology;;   Social History:  reports that he has never smoked. He has never used smokeless tobacco. He reports that he does not drink alcohol and does not use drugs.  Allergies  Allergen Reactions   Citrullus Vulgaris Hives   Family History  Problem Relation Age of Onset   Diabetes Father    Hypertension Father    Hypertension Sister    Diabetes Sister    Family history: Family history reviewed and not pertinent.  Prior to Admission medications   Medication Sig Start Date End Date Taking? Authorizing Provider  acetaminophen  (TYLENOL ) 500 MG tablet Take 1,000 mg by mouth 2 (two) times daily as needed for moderate pain (pain score 4-6).    [provider]  Amantadine  HCl 100 MG tablet Take 1 tablet (100 mg total) by mouth 2 (two) times daily. 04/09/24   Abernathy, Alyssa, NP  clonazePAM (KLONOPIN) 0.5 MG tablet Take 0.5 mg by mouth at bedtime as needed for anxiety.    [provider]  docusate sodium  (COLACE) 100 MG capsule Take 1 capsule (100 mg total) by mouth 3  (three) times daily. 04/09/24   Liana Fish, NP  empagliflozin  (JARDIANCE ) 10 MG TABS tablet Take 1 tablet (10 mg total) by mouth daily. 04/09/24   Abernathy, Alyssa, NP  escitalopram (LEXAPRO) 10 MG tablet Take 10 mg by mouth daily. 07/06/17   [provider]  HYDROcodone -acetaminophen  (NORCO/VICODIN) 5-325 MG tablet Take 1 tablet by mouth 3 (three) times daily as needed for moderate pain (pain score 4-6) or severe pain (pain score 7-10). 09/06/24   Liana Fish, NP  meloxicam  (MOBIC ) 15 MG tablet TAKE 1 TABLET BY MOUTH ONCE DAILY FOR ARTHRITIS FLAIR/PAIN (TAKE WITH FOOD) AS NEEDED 07/12/24   Abernathy, Fish, NP  omeprazole  (PRILOSEC) 20 MG capsule TAKE (1) CAPSULE BY MOUTH ONCE DAILY. 04/09/24   Abernathy, Alyssa, NP  polyethylene glycol-electrolytes (NULYTELY) 420 g solution Take by mouth. 03/06/24   [provider]  risperiDONE (RISPERDAL) 3 MG tablet Take 3 mg by mouth at bedtime. 07/06/17   [provider]  simvastatin  (ZOCOR ) 20 MG tablet Take 1 tablet (20 mg total) by mouth daily. 04/09/24   Liana Fish, NP  sodium fluoride (PREVIDENT) 1.1 % GEL dental gel Place 1 Application onto teeth at bedtime.    [provider]  tamsulosin  (FLOMAX ) 0.4 MG CAPS capsule Take 1 capsule (0.4 mg total) by mouth daily. 02/07/24   Stoioff, Glendia BROCKS, MD  thioridazine (MELLARIL) 100 MG tablet TAKE 1  TABLET BY MOUTH TWICE DAILY 05/06/21   Britta King, MD  Vitamin D , Ergocalciferol , (DRISDOL ) 1.25 MG (50000 UNIT) CAPS capsule Take 1 capsule (50,000 Units total) by mouth every 7 (seven) days. Patient not taking: Reported on 09/05/2024 04/09/24   Liana Fish, NP   Physical Exam: Vitals:   09/08/24 1605 09/08/24 1800 09/08/24 1830  BP: (!) 129/96 (!) 160/90 (!) 143/98  Pulse: 66 71 72  Resp: 18  16  Temp: 98.2 F (36.8 C)  98 F (36.7 C)  TempSrc: Oral  Oral  SpO2: 99% 98% 99%   Constitutional: appears age-appropriate, NAD, calm Eyes: PERRL, lids and conjunctivae  normal ENMT: Mucous membranes are moist. Posterior pharynx clear of any exudate or lesions. Age-appropriate dentition. Hearing appropriate Neck: normal, supple, no masses, no thyromegaly Respiratory: clear to auscultation bilaterally, no wheezing, no crackles. Normal respiratory effort. No accessory muscle use.  Cardiovascular: Regular rate and rhythm, no murmurs / rubs / gallops. No extremity edema. 2+ pedal pulses. No carotid bruits.  Abdomen: Morbidly obese abdomen, no tenderness, no masses palpated, no hepatosplenomegaly. Bowel sounds positive.  Musculoskeletal: no clubbing / cyanosis. No joint deformity upper and lower extremities. Good ROM, no contractures, no atrophy. Normal muscle tone.  Skin: no rashes, lesions, ulcers. No induration Neurologic: Sensation intact. Strength 5/5 in all 4.  Psychiatric: Lacks judgment and insight consistent with history of learning disability. Alert and oriented x 3. Normal mood.   EKG: Not indicated at this time  X-rays on Admission: I personally reviewed and I agree with radiologist reading as below.  DG Knee Complete 4 Views Left Result Date: 09/08/2024 CLINICAL DATA:  Fall from bed onto knees. EXAM: LEFT KNEE - COMPLETE 4+ VIEW; RIGHT KNEE - COMPLETE 4+ VIEW COMPARISON:  None Available. FINDINGS: Right knee: No evidence of fracture, dislocation, or joint effusion. Mild tricompartmental degenerative changes are present. There are enthesopathic changes at the patella. The soft tissues are within normal limits. Left knee: There is no evidence of acute fracture or dislocation. Mild tricompartmental degenerative changes are noted. In the is a pathic changes are present at the patella. No joint effusion is seen. Chondrocalcinosis is noted. The soft tissues are within normal limits. IMPRESSION: No acute fracture or dislocation at the knees bilaterally. Electronically Signed   By: Leita Birmingham M.D.   On: 09/08/2024 16:55   DG Knee Complete 4 Views Right Result  Date: 09/08/2024 CLINICAL DATA:  Fall from bed onto knees. EXAM: LEFT KNEE - COMPLETE 4+ VIEW; RIGHT KNEE - COMPLETE 4+ VIEW COMPARISON:  None Available. FINDINGS: Right knee: No evidence of fracture, dislocation, or joint effusion. Mild tricompartmental degenerative changes are present. There are enthesopathic changes at the patella. The soft tissues are within normal limits. Left knee: There is no evidence of acute fracture or dislocation. Mild tricompartmental degenerative changes are noted. In the is a pathic changes are present at the patella. No joint effusion is seen. Chondrocalcinosis is noted. The soft tissues are within normal limits. IMPRESSION: No acute fracture or dislocation at the knees bilaterally. Electronically Signed   By: Leita Birmingham M.D.   On: 09/08/2024 16:55   Labs on Admission: I have personally reviewed following labs  CBC: Recent Labs  Lab 09/08/24 1707  WBC 4.9  NEUTROABS 3.0  HGB 15.4  HCT 49.4  MCV 87.0  PLT 198   Basic Metabolic Panel: Recent Labs  Lab 09/08/24 1707  NA 140  K 4.1  CL 104  CO2 27  GLUCOSE 98  BUN 22*  CREATININE 1.75*  CALCIUM 8.9   GFR: Estimated Creatinine Clearance: 68.5 mL/min (A) (by C-G formula based on SCr of 1.75 mg/dL (H)).  Liver Function Tests: Recent Labs  Lab 09/08/24 1707  AST 20  ALT 20  ALKPHOS 104  BILITOT 0.7  PROT 6.9  ALBUMIN 3.3*   Urine analysis:    Component Value Date/Time   COLORURINE YELLOW (A) 10/24/2023 0150   APPEARANCEUR CLEAR (A) 10/24/2023 0150   APPEARANCEUR Clear 12/26/2022 0902   LABSPEC 1.038 (H) 10/24/2023 0150   PHURINE 5.0 10/24/2023 0150   GLUCOSEU >=500 (A) 10/24/2023 0150   HGBUR NEGATIVE 10/24/2023 0150   BILIRUBINUR negative 07/18/2024 1425   BILIRUBINUR Negative 12/26/2022 0902   KETONESUR NEGATIVE 10/24/2023 0150   PROTEINUR Negative 07/18/2024 1425   PROTEINUR NEGATIVE 10/24/2023 0150   UROBILINOGEN 0.2 07/18/2024 1425   NITRITE negative 07/18/2024 1425   NITRITE  NEGATIVE 10/24/2023 0150   LEUKOCYTESUR Negative 07/18/2024 1425   LEUKOCYTESUR NEGATIVE 10/24/2023 0150   This document was prepared using Dragon Voice Recognition software and may include unintentional dictation errors.  Dr. Sherre [facility]  If 7PM-7AM, please contact overnight-coverage provider If 7AM-7PM, please contact day attending provider [email or web address]  09/08/2024, 7:04 PM

## 2024-09-08 NOTE — Assessment & Plan Note (Signed)
 Home Jardiance  not resumed on admission Insulin SSI with at bedtime coverage, renal dosing ordered on admission

## 2024-09-08 NOTE — ED Notes (Signed)
 Grey top sent down

## 2024-09-08 NOTE — Assessment & Plan Note (Signed)
 Home simvastatin  20 mg daily resume

## 2024-09-08 NOTE — Progress Notes (Signed)
 Neurosurgery brief note Was contacted by the emergency room regarding this patient.  Apparently he is presenting with difficulty thriving within his group living facility trouble transfers and increasing pain.  He is currently scheduled to undergo C3-4 ACDF as well as a T2/T3 decompression and fusion this coming week.  The patient is being admitted to the hospitalist service for assistance with pain control as well as hydration and oral intake in preparation for his surgery neurosurgery will continue to follow along closely appreciate the excellent care of the hospitalist team we will plan to proceed with preoperative evaluations this week with proceeding with planned surgery on Thursday Dr. Claudene returns tomorrow.  Belvie PARAS. Deatrice, MD Neurosurgery

## 2024-09-08 NOTE — Hospital Course (Signed)
 All Edwin [name] is a 58 year old male with history of GERD, depression, anxiety, hyperlipidemia, morbid obesity, who presents ED for chief concerns of falling episodes at group home.  Vitals in the ED showed t of 98.2, rr 18, heart rate 66, blood pressure 129/96, SpO2 99% on room air.  Serum sodium is 140, potassium 4.1, chloride 104, bicarb 27, BUN of 22, serum creatinine 1.75, eGFR 44, nonfasting blood glucose 98, WBC 4.9, hemoglobin 15.4, platelets of 198.  ED treatment: Norco 5-325 mg p.o. one-time dose, sodium chloride  500 mL liter bolus.

## 2024-09-09 ENCOUNTER — Inpatient Hospital Stay
Admission: RE | Admit: 2024-09-09 | Discharge: 2024-09-09 | Disposition: A | Discharge status: Home / Self Care | Source: Ambulatory Visit

## 2024-09-09 ENCOUNTER — Telehealth: Payer: Self-pay | Admitting: Neurosurgery

## 2024-09-09 DIAGNOSIS — R296 Repeated falls: Secondary | ICD-10-CM | POA: Diagnosis not present

## 2024-09-09 LAB — GLUCOSE, CAPILLARY
Glucose-Capillary: 101 mg/dL — ABNORMAL HIGH (ref 70–99)
Glucose-Capillary: 104 mg/dL — ABNORMAL HIGH (ref 70–99)
Glucose-Capillary: 122 mg/dL — ABNORMAL HIGH (ref 70–99)
Glucose-Capillary: 134 mg/dL — ABNORMAL HIGH (ref 70–99)
Glucose-Capillary: 93 mg/dL (ref 70–99)

## 2024-09-09 LAB — BASIC METABOLIC PANEL WITH GFR
Anion gap: 7 (ref 5–15)
BUN: 26 mg/dL — ABNORMAL HIGH (ref 6–20)
CO2: 27 mmol/L (ref 22–32)
Calcium: 8.6 mg/dL — ABNORMAL LOW (ref 8.9–10.3)
Chloride: 107 mmol/L (ref 98–111)
Creatinine, Ser: 1.78 mg/dL — ABNORMAL HIGH (ref 0.61–1.24)
GFR, Estimated: 43 mL/min — ABNORMAL LOW (ref >60)
Glucose, Bld: 86 mg/dL (ref 70–99)
Potassium: 3.8 mmol/L (ref 3.5–5.1)
Sodium: 141 mmol/L (ref 135–145)

## 2024-09-09 LAB — CBC
HCT: 46.7 % (ref 39.0–52.0)
Hemoglobin: 14.8 g/dL (ref 13.0–17.0)
MCH: 27.4 pg (ref 26.0–34.0)
MCHC: 31.7 g/dL (ref 30.0–36.0)
MCV: 86.3 fL (ref 80.0–100.0)
Platelets: 178 K/uL (ref 150–400)
RBC: 5.41 MIL/uL (ref 4.22–5.81)
RDW: 13.4 % (ref 11.5–15.5)
WBC: 4.1 K/uL (ref 4.0–10.5)
nRBC: 0 % (ref 0.0–0.2)

## 2024-09-09 LAB — HIV ANTIBODY (ROUTINE TESTING W REFLEX): HIV Screen 4th Generation wRfx: NONREACTIVE

## 2024-09-09 MED ORDER — THIORIDAZINE HCL 25 MG PO TABS
100.0000 mg | ORAL_TABLET | Freq: Two times a day (BID) | ORAL | Status: DC
  Administered 2024-09-09 – 2024-09-22 (×25): 100 mg via ORAL
  Filled 2024-09-09 (×35): qty 4

## 2024-09-09 MED ORDER — OXYCODONE HCL 5 MG PO TABS
5.0000 mg | ORAL_TABLET | ORAL | Status: DC | PRN
  Administered 2024-09-10: 5 mg via ORAL
  Filled 2024-09-09: qty 1

## 2024-09-09 NOTE — Telephone Encounter (Signed)
 Please see below messages from the answering service. Patient's legal guardian stopped by the office this morning and PAT notified that patient is currently admitted.     Media Information    Document Information  AMB Correspondence  NEUROSURGERY ANSWERING SERVICE CALL  09/08/2024 08:05  Attached To:  Edwin [name]  Source Information  Default, Provider, MD

## 2024-09-09 NOTE — Evaluation (Signed)
 Physical Therapy Evaluation Patient Details Name: Edwin [name] MRN: [id number] DOB: 07-20-1966 Today's Date: 09/09/2024  History of Present Illness  Pt is a 58 y/o M presenting to ED after sustaining a fall out of bed. C-spine Xrays: Cervical stenosis of spine. Pt with planned neurosurgery  scheduled for 09/12/2024. PMH significant for autism spectrum disorder, CKD, T2DM, HLD, schizoaffective disorder, anxiety. MD assessment includes falling episodes, cervical stenosis of spine, obesity, abnormality of gait.   Clinical Impression  Pt A&Ox4, pleasant and agreeable to participate in PT/OT co-evaluation. At baseline, pt is ambulatory with rollator, however he has had a recent functional decline resulting in multiple falls and increased assistance for ADLs. Pt was met semi-supine in bed, maxAx2 trunk/BLE assist to achieve sitting EOB. Pt performed 2 STS from elevated EOB, initially modAx2, progressed to minA on second attempt for RW stabilization. Pt able to take ~2 steps to L toward [facility] with modAx2, heavy BUE use on RW and max multimodal cues for sequencing throughout. Pt demonstrated poor eccentric control for STS, likely due to weakness in knee extensors. Pt required maxAx2 to laterally scoot up toward HOB. Pt was left semi-supine in bed at end of session, all needs in reach, supportive mother at bedside. Pt would benefit from skilled PT intervention to address listed deficits (see PT Problem List) and increase independence with mobility/ADLs.         If plan is discharge home, recommend the following: Two people to help with walking and/or transfers;A lot of help with bathing/dressing/bathroom;Assistance with cooking/housework;Direct supervision/assist for medications management;Assist for transportation;Help with stairs or ramp for entrance   Can travel by private vehicle   No    Equipment Recommendations Other (comment) (TBD at next venue of care)  Recommendations for Other Services        Functional Status Assessment Patient has had a recent decline in their functional status and demonstrates the ability to make significant improvements in function in a reasonable and predictable amount of time.     Precautions / Restrictions Precautions Precautions: Fall Recall of Precautions/Restrictions: Intact Restrictions Weight Bearing Restrictions Per Provider Order: No      Mobility  Bed Mobility Overal bed mobility: Needs Assistance Bed Mobility: Rolling, Supine to Sit, Sit to Supine Rolling: Max assist, Used rails   Supine to sit: Max assist, +2 for physical assistance, HOB elevated, Used rails Sit to supine: Max assist, +2 for physical assistance   General bed mobility comments: pt able to initiate bed mobility, +2 trunk/BLE assist for all tasks with max multimodal cues for sequencing    Transfers Overall transfer level: Needs assistance Equipment used: Rolling walker (2 wheels) Transfers: Sit to/from Stand, Bed to chair/wheelchair/BSC Sit to Stand: Mod assist, +2 physical assistance, From elevated surface, Min assist          Lateral/Scoot Transfers: Max assist, +2 physical assistance General transfer comment: 2 STS from elevated EOB, modAx2 on initial attempt, minA for second attempt to stabilize RW. MaxAx2 to laterally scoot up toward [facility]    Ambulation/Gait Ambulation/Gait assistance: Mod assist, +2 physical assistance Gait Distance (Feet): 2 Feet Assistive device: Rolling walker (2 wheels)         General Gait Details: pt able to take 2 steps to L toward HOB with modAx2, max multimodal cues for step sequencing, heavy BUE support on RW  Stairs            Wheelchair Mobility     Tilt Bed    Modified Rankin (Stroke Patients  Only)       Balance Overall balance assessment: Needs assistance Sitting-balance support: Feet supported Sitting balance-Leahy Scale: Good Sitting balance - Comments: steady static and dynamic sitting   Standing  balance support: Bilateral upper extremity supported, Reliant on assistive device for balance Standing balance-Leahy Scale: Poor Standing balance comment: heavy BUE support on RW                             Pertinent Vitals/Pain Pain Assessment Pain Assessment: Faces Faces Pain Scale: Hurts little more Pain Location: R groin, with movement Pain Descriptors / Indicators: Grimacing, Sore Pain Intervention(s): Limited activity within patient's tolerance, Monitored during session, Repositioned    Home Living Family/patient expects to be discharged to:: Group home Living Arrangements: Group Home                      Prior Function Prior Level of Function : Needs assist             Mobility Comments: MOD I using rollator unbtil last ~week reporting increased falls ADLs Comments: pt has required increased assistance with ADLs recently     Extremity/Trunk Assessment   Upper Extremity Assessment Upper Extremity Assessment: Generalized weakness    Lower Extremity Assessment Lower Extremity Assessment: Generalized weakness       Communication   Communication Communication: No apparent difficulties    Cognition Arousal: Alert Behavior During Therapy: WFL for tasks assessed/performed   PT - Cognitive impairments: No apparent impairments                       PT - Cognition Comments: A&Ox4 Following commands: Impaired Following commands impaired: Follows one step commands with increased time     Cueing Cueing Techniques: Verbal cues, Tactile cues, Visual cues     General Comments      Exercises     Assessment/Plan    PT Assessment Patient needs continued PT services  PT Problem List Decreased strength;Decreased activity tolerance;Decreased balance;Decreased mobility       PT Treatment Interventions DME instruction;Gait training;Functional mobility training;Therapeutic activities;Therapeutic exercise;Balance training;Neuromuscular  re-education;Stair training;Cognitive remediation;Patient/family education    PT Goals (Current goals can be found in the Care Plan section)  Acute Rehab PT Goals Patient Stated Goal: to get better PT Goal Formulation: With patient/family Time For Goal Achievement: 09/23/24 Potential to Achieve Goals: Fair    Frequency Min 2X/week     Co-evaluation PT/OT/SLP Co-Evaluation/Treatment: Yes Reason for Co-Treatment: For patient/therapist safety;To address functional/ADL transfers PT goals addressed during session: Mobility/safety with mobility;Balance OT goals addressed during session: ADL's and self-care       AM-PAC PT 6 Clicks Mobility  Outcome Measure Help needed turning from your back to your side while in a flat bed without using bedrails?: A Lot Help needed moving from lying on your back to sitting on the side of a flat bed without using bedrails?: A Lot Help needed moving to and from a bed to a chair (including a wheelchair)?: Total Help needed standing up from a chair using your arms (e.g., wheelchair or bedside chair)?: A Lot Help needed to walk in hospital room?: Total Help needed climbing 3-5 steps with a railing? : Total 6 Click Score: 9    End of Session Equipment Utilized During Treatment: Gait belt Activity Tolerance: Patient tolerated treatment well Patient left: in bed;with call bell/phone within reach;with bed alarm set;with family/visitor present Nurse Communication:  Mobility status PT Visit Diagnosis: Unsteadiness on feet (R26.81);Muscle weakness (generalized) (M62.81);History of falling (Z91.81);Difficulty in walking, not elsewhere classified (R26.2)    Time: 9064-9041 PT Time Calculation (min) (ACUTE ONLY): 23 min   Charges:   PT Evaluation $PT Eval Moderate Complexity: 1 Mod   PT General Charges $$ ACUTE PT VISIT: 1 Visit         Hassaan Crite, SPT

## 2024-09-09 NOTE — Plan of Care (Signed)
  Problem: Health Behavior/Discharge Planning: Goal: Ability to manage health-related needs will improve Outcome: Progressing   Problem: Clinical Measurements: Goal: Will remain free from infection Outcome: Progressing   Problem: Activity: Goal: Risk for activity intolerance will decrease Outcome: Progressing   Problem: Elimination: Goal: Will not experience complications related to urinary retention Outcome: Progressing   Problem: Safety: Goal: Ability to remain free from injury will improve Outcome: Progressing

## 2024-09-09 NOTE — NC FL2 (Signed)
 [place]  MEDICAID [id number] LEVEL OF CARE FORM     IDENTIFICATION  Patient Name: Edwin [name] Birthdate: Sep 19, 1966 Sex: male Admission Date (Current Location): 09/08/2024  [facility] and [place] Number:  Chiropodist and Address:  [facility], [address], [place], [place] [address]      Provider Number: [id number]  Attending Physician Name and Address:  Jhonny Calvin NOVAK, MD  Relative Name and Phone Number:  SYLVIE TOD BRAVO  [phone]    Current Level of Care: Hospital Recommended Level of Care: Skilled Nursing Facility Prior Approval Number:    Date Approved/Denied:   PASRR Number:  [id number]  Discharge Plan: SNF    Current Diagnoses: Patient Active Problem List   Diagnosis Date Noted   Falling episodes 09/08/2024   Obesity, Class III, BMI 40-49.9 (morbid obesity) (HCC) 09/08/2024   Cervical stenosis of spine 09/08/2024   Spondylosis, cervical, with myelopathy 09/04/2024   Cervical disc herniation 09/04/2024   Thoracic spondylosis with myelopathy 09/04/2024   Weakness of both lower extremities 09/04/2024   Lower extremity numbness 09/04/2024   Abnormality of gait 09/04/2024   Adenomatous polyp of colon 04/17/2024   Gastroesophageal reflux disease without esophagitis 11/23/2023   Schizoaffective disorder (HCC) 11/24/2022   Class 3 severe obesity with serious comorbidity and body mass index (BMI) of 40.0 to 44.9 in adult [facility]) 08/04/2022   Hyperlipidemia 08/04/2022   Type 2 diabetes mellitus with stage 3a chronic kidney disease, without long-term current use of insulin (HCC) 12/18/2020   Stage 3b chronic kidney disease (HCC) 11/20/2020   Colon cancer screening 11/20/2020   Moderate mixed hyperlipidemia not requiring statin therapy 08/21/2020   Obesity (BMI 35.0-39.9 without comorbidity) 05/13/2020   Delusion (HCC) 05/13/2020   Sleep apnea in adult 05/13/2020    Orientation RESPIRATION BLADDER Height & Weight     Self,  Time, Situation    Continent Weight: (!) 142.2 kg Height:  6' 2.02 (188 cm)  BEHAVIORAL SYMPTOMS/MOOD NEUROLOGICAL BOWEL NUTRITION STATUS      Continent Diet (Heart)  AMBULATORY STATUS COMMUNICATION OF NEEDS Skin   Limited Assist Verbally                         Personal Care Assistance Level of Assistance  Bathing, Feeding, Dressing Bathing Assistance: Limited assistance Feeding assistance: Limited assistance Dressing Assistance: Limited assistance     Functional Limitations Info             SPECIAL CARE FACTORS FREQUENCY  PT (By licensed PT), OT (By licensed OT)     PT Frequency: 5 x week OT Frequency: 5 x week            Contractures      Additional Factors Info  Code Status, Allergies Code Status Info: FULL Allergies Info: Citrullus Vulgaris           Current Medications (09/09/2024):  This is the current hospital active medication list Current Facility-Administered Medications  Medication Dose Route Frequency Provider Last Rate Last Admin   acetaminophen  (TYLENOL ) tablet 650 mg  650 mg Oral Q6H PRN Cox, Amy N, DO       Or   acetaminophen  (TYLENOL ) suppository 650 mg  650 mg Rectal Q6H PRN Cox, Amy N, DO       amantadine  (SYMMETREL ) capsule 100 mg  100 mg Oral BID Cox, Amy N, DO   100 mg at 09/09/24 0845   clonazePAM (KLONOPIN) tablet 0.5 mg  0.5 mg Oral  QHS PRN Cox, Amy N, DO   0.5 mg at 09/09/24 0131   docusate sodium  (COLACE) capsule 100 mg  100 mg Oral TID Cox, Amy N, DO   100 mg at 09/09/24 0850   escitalopram (LEXAPRO) tablet 10 mg  10 mg Oral Daily Cox, Amy N, DO   10 mg at 09/09/24 0844   heparin injection 5,000 Units  5,000 Units Subcutaneous Q8H Cox, Amy N, DO   5,000 Units at 09/09/24 0848   insulin aspart (novoLOG) injection 0-5 Units  0-5 Units Subcutaneous QHS Cox, Amy N, DO       insulin aspart (novoLOG) injection 0-9 Units  0-9 Units Subcutaneous TID WC Cox, Amy N, DO   1 Units at 09/09/24 1129   ondansetron  (ZOFRAN ) tablet 4 mg  4 mg  Oral Q6H PRN Cox, Amy N, DO       Or   ondansetron  (ZOFRAN ) injection 4 mg  4 mg Intravenous Q6H PRN Cox, Amy N, DO       oxyCODONE (Oxy IR/ROXICODONE) immediate release tablet 5-10 mg  5-10 mg Oral Q4H PRN Sreenath, Sudheer B, MD       pantoprazole (PROTONIX) EC tablet 40 mg  40 mg Oral Daily Cox, Amy N, DO   40 mg at 09/09/24 0844   risperiDONE (RISPERDAL) tablet 3 mg  3 mg Oral QHS Cox, Amy N, DO   3 mg at 09/08/24 2329   senna-docusate (Senokot-S) tablet 1 tablet  1 tablet Oral QHS PRN Cox, Amy N, DO       simvastatin  (ZOCOR ) tablet 20 mg  20 mg Oral Daily Cox, Amy N, DO   20 mg at 09/09/24 0845   tamsulosin  (FLOMAX ) capsule 0.4 mg  0.4 mg Oral Daily Cox, Amy N, DO   0.4 mg at 09/09/24 0845   thioridazine (MELLARIL) tablet 100 mg  100 mg Oral BID Greenwood, Howard F, RPH   100 mg at 09/09/24 9154     Discharge Medications: Please see discharge summary for a list of discharge medications.  Relevant Imaging Results:  Relevant Lab Results:   Additional Information SSN    [id number]  Dalia GORMAN Fuse, RN

## 2024-09-09 NOTE — Progress Notes (Signed)
 PROGRESS NOTE    Edwin [name]  FMW:[id number] DOB: 12/25/1965 DOA: 09/08/2024 PCP: Liana Fish, NP    Brief Narrative:  58 year old male with history of GERD, depression, anxiety, hyperlipidemia, morbid obesity, who presents ED for chief concerns of falling episodes at group home.   Patient was admitted due to safety concerns.  He is scheduled for spine surgery with Dr. Penne Sharps later this week.   Assessment & Plan:   Principal Problem:   Falling episodes Active Problems:   Delusion (HCC)   Sleep apnea in adult   Moderate mixed hyperlipidemia not requiring statin therapy   Stage 3b chronic kidney disease (HCC)   Type 2 diabetes mellitus with stage 3a chronic kidney disease, without long-term current use of insulin (HCC)   Hyperlipidemia   Schizoaffective disorder (HCC)   Gastroesophageal reflux disease without esophagitis   Abnormality of gait   Obesity, Class III, BMI 40-49.9 (morbid obesity) (HCC)   Cervical stenosis of spine   Falling episodes Suspect secondary to spinal stenosis Patient is not endorsing pain Plan: Multimodal pain control Therapy evaluations Neurosurgical consultation Discussed with Dr. Sharps   Cervical stenosis of spine Patient has planned neurosurgery for 09/12/2024 Neurosurgery made aware   Obesity, Class III, BMI 40-49.9 (morbid obesity) (HCC) This complicates overall care and prognosis.    Abnormality of gait Fall precaution   Schizoaffective disorder (HCC) Home risperidone 3 mg nightly, Thioridazine 100 mg p.o. twice daily, escitalopram 10 mg daily were resumed on admission   Hyperlipidemia Home simvastatin  20 mg daily resume   Type 2 diabetes mellitus with stage 3a chronic kidney disease, without long-term current use of insulin (HCC) Home Jardiance  not resumed on admission Insulin SSI with at bedtime coverage, renal dosing ordered on admission   Stage 3b chronic kidney disease (HCC) At baseline   Sleep apnea in  adult CPAP nightly ordered   Delusion (HCC) With autism   DVT prophylaxis: SQH Code Status: Full Family Communication: 2 family members at bedside 10/6 Disposition Plan: Status is: Inpatient Remains inpatient appropriate because: Frequent falls.  Unsafe disposition.  Neurosurgery planning surgical intervention later this week.   Level of care: Med-Surg  Consultants:  Neurosurgery  Procedures:  None  Antimicrobials: None   Subjective: Seen and examined.  2 family members at bedside.  Patient appears in no distress.  History limited by underlying intellectual delay  Objective: Vitals:   09/08/24 2030 09/09/24 0100 09/09/24 0336 09/09/24 0718  BP: (!) 153/91  (!) 131/91 (!) 158/88  Pulse: 75  70 90  Resp:    19  Temp: (!) 97.5 F (36.4 C)  (!) 97.4 F (36.3 C) (!) 97.5 F (36.4 C)  TempSrc: Oral     SpO2: 98%  99% 97%  Weight:   (!) 142.2 kg   Height:  6' 2.02 (1.88 m)      Intake/Output Summary (Last 24 hours) at 09/09/2024 1223 Last data filed at 09/09/2024 1100 Gross per 24 hour  Intake 240 ml  Output 750 ml  Net -510 ml   Filed Weights   09/09/24 0336  Weight: (!) 142.2 kg    Examination:  General exam: NAD Respiratory system: Clear to auscultation. Respiratory effort normal. Cardiovascular system: S1-S2, RRR, no murmurs, no pedal edema Gastrointestinal system: Obese, soft, NT/ND, normal bowel sounds Central nervous system: Alert and oriented. No focal neurological deficits. Extremities: Symmetric 5 x 5 power. Skin: No rashes, lesions or ulcers Psychiatry: Judgement and insight appear impaired. Mood & affect flattened.  Data Reviewed: I have personally reviewed following labs and imaging studies  CBC: Recent Labs  Lab 09/08/24 1707 09/09/24 0454  WBC 4.9 4.1  NEUTROABS 3.0  --   HGB 15.4 14.8  HCT 49.4 46.7  MCV 87.0 86.3  PLT 198 178   Basic Metabolic Panel: Recent Labs  Lab 09/08/24 1707 09/09/24 0454  NA 140 141  K 4.1  3.8  CL 104 107  CO2 27 27  GLUCOSE 98 86  BUN 22* 26*  CREATININE 1.75* 1.78*  CALCIUM 8.9 8.6*   GFR: Estimated Creatinine Clearance: 67.1 mL/min (A) (by C-G formula based on SCr of 1.78 mg/dL (H)). Liver Function Tests: Recent Labs  Lab 09/08/24 1707  AST 20  ALT 20  ALKPHOS 104  BILITOT 0.7  PROT 6.9  ALBUMIN 3.3*   No results for input(s): LIPASE, AMYLASE in the last 168 hours. No results for input(s): AMMONIA in the last 168 hours. Coagulation Profile: No results for input(s): INR, PROTIME in the last 168 hours. Cardiac Enzymes: No results for input(s): CKTOTAL, CKMB, CKMBINDEX, TROPONINI in the last 168 hours. BNP (last 3 results) No results for input(s): PROBNP in the last 8760 hours. HbA1C: No results for input(s): HGBA1C in the last 72 hours. CBG: Recent Labs  Lab 09/08/24 2237 09/09/24 0718 09/09/24 1122  GLUCAP 109* 93 134*   Lipid Profile: No results for input(s): CHOL, HDL, LDLCALC, TRIG, CHOLHDL, LDLDIRECT in the last 72 hours. Thyroid Function Tests: No results for input(s): TSH, T4TOTAL, FREET4, T3FREE, THYROIDAB in the last 72 hours. Anemia Panel: No results for input(s): VITAMINB12, FOLATE, FERRITIN, TIBC, IRON, RETICCTPCT in the last 72 hours. Sepsis Labs: No results for input(s): PROCALCITON, LATICACIDVEN in the last 168 hours.  No results found for this or any previous visit (from the past 240 hours).       Radiology Studies: DG Knee Complete 4 Views Left Result Date: 09/08/2024 CLINICAL DATA:  Fall from bed onto knees. EXAM: LEFT KNEE - COMPLETE 4+ VIEW; RIGHT KNEE - COMPLETE 4+ VIEW COMPARISON:  None Available. FINDINGS: Right knee: No evidence of fracture, dislocation, or joint effusion. Mild tricompartmental degenerative changes are present. There are enthesopathic changes at the patella. The soft tissues are within normal limits. Left knee: There is no evidence of acute  fracture or dislocation. Mild tricompartmental degenerative changes are noted. In the is a pathic changes are present at the patella. No joint effusion is seen. Chondrocalcinosis is noted. The soft tissues are within normal limits. IMPRESSION: No acute fracture or dislocation at the knees bilaterally. Electronically Signed   By: Leita Birmingham M.D.   On: 09/08/2024 16:55   DG Knee Complete 4 Views Right Result Date: 09/08/2024 CLINICAL DATA:  Fall from bed onto knees. EXAM: LEFT KNEE - COMPLETE 4+ VIEW; RIGHT KNEE - COMPLETE 4+ VIEW COMPARISON:  None Available. FINDINGS: Right knee: No evidence of fracture, dislocation, or joint effusion. Mild tricompartmental degenerative changes are present. There are enthesopathic changes at the patella. The soft tissues are within normal limits. Left knee: There is no evidence of acute fracture or dislocation. Mild tricompartmental degenerative changes are noted. In the is a pathic changes are present at the patella. No joint effusion is seen. Chondrocalcinosis is noted. The soft tissues are within normal limits. IMPRESSION: No acute fracture or dislocation at the knees bilaterally. Electronically Signed   By: Leita Birmingham M.D.   On: 09/08/2024 16:55        Scheduled Meds:  amantadine   100  mg Oral BID   docusate sodium   100 mg Oral TID   escitalopram  10 mg Oral Daily   heparin  5,000 Units Subcutaneous Q8H   insulin aspart  0-5 Units Subcutaneous QHS   insulin aspart  0-9 Units Subcutaneous TID WC   pantoprazole  40 mg Oral Daily   risperiDONE  3 mg Oral QHS   simvastatin   20 mg Oral Daily   tamsulosin   0.4 mg Oral Daily   thioridazine  100 mg Oral BID   Continuous Infusions:   LOS: 1 day     Calvin KATHEE Robson, MD [facility]   If 7PM-7AM, please contact night-coverage  09/09/2024, 12:23 PM

## 2024-09-09 NOTE — Evaluation (Signed)
 Occupational Therapy Evaluation Patient Details Name: Edwin [name] MRN: [id number] DOB: January 17, 1966 Today's Date: 09/09/2024   History of Present Illness   Pt is a 58 y/o M presenting to ED after sustaining a fall out of bed. C-spine Xrays: Cervical stenosis of spine. Pt with planned neurosurgery  scheduled for 09/12/2024. PMH significant for autism spectrum disorder, CKD, T2DM, HLD, schizoaffective disorder, anxiety. MD assessment includes falling episodes, cervical stenosis of spine, obesity, abnormality of gait.    Clinical Impressions Mr Hutcherson was seen for OT evaluation this date. Prior to hospital admission, pt was MOD I using rollator with increasing assist needed for ADLs and mobility ~1 week. Pt lives in group home. Pt currently requires MAX A x2 bed mobility, fair sitting balance. Initial MOD A x2 +RW sit<>stand improves to MIN A + RW sit<>stand from elevated bed. MOD A x2 + RW for x2 side steps with B knee buckling noted. Pt would benefit from skilled OT to address noted impairments and functional limitations (see below for any additional details). Upon hospital discharge, recommend OT follow up <3 hours/day.     If plan is discharge home, recommend the following:   Two people to help with walking and/or transfers;A lot of help with bathing/dressing/bathroom     Functional Status Assessment   Patient has had a recent decline in their functional status and demonstrates the ability to make significant improvements in function in a reasonable and predictable amount of time.     Equipment Recommendations   Other (comment) (defer)     Recommendations for Other Services         Precautions/Restrictions   Precautions Precautions: Fall Recall of Precautions/Restrictions: Intact Restrictions Weight Bearing Restrictions Per Provider Order: No     Mobility Bed Mobility Overal bed mobility: Needs Assistance Bed Mobility: Rolling, Supine to Sit, Sit to Supine Rolling:  Max assist, Used rails   Supine to sit: Max assist, +2 for physical assistance, HOB elevated, Used rails Sit to supine: Max assist, +2 for physical assistance        Transfers Overall transfer level: Needs assistance Equipment used: Rolling walker (2 wheels) Transfers: Sit to/from Stand, Bed to chair/wheelchair/BSC Sit to Stand: Min assist, From elevated surface, +2 safety/equipment          Lateral/Scoot Transfers: Max assist, +2 physical assistance        Balance Overall balance assessment: Needs assistance Sitting-balance support: Feet supported Sitting balance-Leahy Scale: Fair     Standing balance support: Bilateral upper extremity supported, Reliant on assistive device for balance Standing balance-Leahy Scale: Poor                             ADL either performed or assessed with clinical judgement   ADL Overall ADL's : Needs assistance/impaired                                       General ADL Comments: MAX A don B socks seated EOB. SETUP + SBA seated facewashing      Pertinent Vitals/Pain Pain Assessment Pain Assessment: Faces Faces Pain Scale: Hurts little more Pain Location: R groin, with movement Pain Descriptors / Indicators: Grimacing, Sore Pain Intervention(s): Repositioned, Limited activity within patient's tolerance     Extremity/Trunk Assessment Upper Extremity Assessment Upper Extremity Assessment: Generalized weakness   Lower Extremity Assessment Lower Extremity Assessment: Generalized weakness  Communication Communication Communication: No apparent difficulties   Cognition Arousal: Alert Behavior During Therapy: WFL for tasks assessed/performed Cognition: History of cognitive impairments             OT - Cognition Comments: delayed processing but answers questions appropriately. Disoriented to time only                 Following commands: Impaired Following commands impaired: Follows  one step commands with increased time     Cueing  General Comments   Cueing Techniques: Verbal cues;Tactile cues;Visual cues      Exercises     Shoulder Instructions      Home Living Family/patient expects to be discharged to:: Group home Living Arrangements: Group Home                                      Prior Functioning/Environment Prior Level of Function : Needs assist             Mobility Comments: MOD I using rollator unbtil last ~week reporting increased falls ADLs Comments: pt has required increased assistance with ADLs recently    OT Problem List: Decreased strength;Decreased range of motion;Decreased activity tolerance;Impaired balance (sitting and/or standing);Decreased safety awareness   OT Treatment/Interventions: Self-care/ADL training;Therapeutic exercise;Energy conservation;DME and/or AE instruction;Therapeutic activities;Patient/family education;Balance training      OT Goals(Current goals can be found in the care plan section)   Acute Rehab OT Goals Patient Stated Goal: to walk OT Goal Formulation: With patient/family Time For Goal Achievement: 09/23/24 Potential to Achieve Goals: Good ADL Goals Pt Will Perform Grooming: standing;with supervision Pt Will Perform Lower Body Dressing: with min assist;sit to/from stand Pt Will Transfer to Toilet: with min assist;bedside commode   OT Frequency:  Min 2X/week    Co-evaluation PT/OT/SLP Co-Evaluation/Treatment: Yes Reason for Co-Treatment: For patient/therapist safety;To address functional/ADL transfers PT goals addressed during session: Mobility/safety with mobility;Balance OT goals addressed during session: ADL's and self-care      AM-PAC OT 6 Clicks Daily Activity     Outcome Measure Help from another person eating meals?: None Help from another person taking care of personal grooming?: A Little Help from another person toileting, which includes using toliet, bedpan, or  urinal?: A Lot Help from another person bathing (including washing, rinsing, drying)?: A Lot Help from another person to put on and taking off regular upper body clothing?: A Little Help from another person to put on and taking off regular lower body clothing?: A Lot 6 Click Score: 16   End of Session Equipment Utilized During Treatment: Rolling walker (2 wheels);Gait belt Nurse Communication: Mobility status  Activity Tolerance: Patient tolerated treatment well Patient left: in bed;with call bell/phone within reach;with bed alarm set;with family/visitor present  OT Visit Diagnosis: Other abnormalities of gait and mobility (R26.89);Muscle weakness (generalized) (M62.81)                Time: 9064-9041 OT Time Calculation (min): 23 min Charges:  OT General Charges $OT Visit: 1 Visit OT Evaluation $OT Eval Moderate Complexity: 1 Mod OT Treatments $Self Care/Home Management : 8-22 mins  Elston Slot, M.S. OTR/L  09/09/24, 11:43 AM  ascom [phone]

## 2024-09-09 NOTE — Telephone Encounter (Signed)
 Patient is currently admitted

## 2024-09-10 LAB — GLUCOSE, CAPILLARY
Glucose-Capillary: 99 mg/dL (ref 70–99)
Glucose-Capillary: 105 mg/dL — ABNORMAL HIGH (ref 70–99)
Glucose-Capillary: 147 mg/dL — ABNORMAL HIGH (ref 70–99)
Glucose-Capillary: 141 mg/dL — ABNORMAL HIGH (ref 70–99)

## 2024-09-10 MED ORDER — BISACODYL 10 MG RE SUPP
10.0000 mg | Freq: Every day | RECTAL | Status: DC | PRN
  Administered 2024-09-10: 10 mg via RECTAL
  Filled 2024-09-10 (×2): qty 1

## 2024-09-10 MED ORDER — LORATADINE 10 MG PO TABS
10.0000 mg | ORAL_TABLET | Freq: Every day | ORAL | Status: DC | PRN
  Administered 2024-09-10: 10 mg via ORAL
  Filled 2024-09-10: qty 1

## 2024-09-10 MED ORDER — POLYVINYL ALCOHOL 1.4 % OP SOLN
1.0000 [drp] | OPHTHALMIC | Status: DC | PRN
  Administered 2024-09-10: 1 [drp] via OPHTHALMIC
  Filled 2024-09-10: qty 15

## 2024-09-10 MED ORDER — SENNOSIDES-DOCUSATE SODIUM 8.6-50 MG PO TABS
1.0000 | ORAL_TABLET | Freq: Two times a day (BID) | ORAL | Status: DC
  Administered 2024-09-10 – 2024-09-18 (×16): 1 via ORAL
  Filled 2024-09-10 (×16): qty 1

## 2024-09-10 NOTE — Plan of Care (Signed)

## 2024-09-10 NOTE — Plan of Care (Signed)

## 2024-09-10 NOTE — TOC Initial Note (Addendum)
 Transition of Care [facility]) - Initial/Assessment Note    Patient Details  Name: Edwin [name] MRN: [id number] Date of Birth: [birth year]  [facility]) CM/SW Contact:    Dalia GORMAN Fuse, RN Phone Number: 09/10/2024, 11:22 AM  Clinical Narrative:                 Therapy recs are for STR. TOC met with the patient and his mother in the patient's room. The patient's sister [name] is the patient's primary caregiver. The mother and [name] are in agreement with SNF. Bed offers are pending at this time. TOC will continue to follow.   PASRR obtained [id number] [id number]  Expected Discharge Plan: Skilled Nursing Facility Barriers to Discharge: Continued Medical Work up   Patient Goals and CMS Choice     Choice offered to / list presented to : Parent      Expected Discharge Plan and Services   Discharge Planning Services: CM Consult Post Acute Care Choice: Skilled Nursing Facility Living arrangements for the past 2 months: Single Family Home                                      Prior Living Arrangements/Services Living arrangements for the past 2 months: Single Family Home Lives with:: Siblings                   Activities of Daily Living   ADL Screening (condition at time of admission) Independently performs ADLs?: Yes (appropriate for developmental age) Is the patient deaf or have difficulty hearing?: No Does the patient have difficulty seeing, even when wearing glasses/contacts?: No Does the patient have difficulty concentrating, remembering, or making decisions?: No  Permission Sought/Granted                  Emotional Assessment              Admission diagnosis:  Neurogenic claudication due to lumbar spinal stenosis [M48.062] Falling episodes [R29.6] Declining mobility [Z74.09] Fall in home, initial encounter [W19.CHERENE, Y92.009] Patient Active Problem List   Diagnosis Date Noted   Falling episodes 09/08/2024   Obesity, Class III, BMI  40-49.9 (morbid obesity) (HCC) 09/08/2024   Cervical stenosis of spine 09/08/2024   Spondylosis, cervical, with myelopathy 09/04/2024   Cervical disc herniation 09/04/2024   Thoracic spondylosis with myelopathy 09/04/2024   Weakness of both lower extremities 09/04/2024   Lower extremity numbness 09/04/2024   Abnormality of gait 09/04/2024   Adenomatous polyp of colon 04/17/2024   Gastroesophageal reflux disease without esophagitis 11/23/2023   Schizoaffective disorder (HCC) 11/24/2022   Class 3 severe obesity with serious comorbidity and body mass index (BMI) of 40.0 to 44.9 in adult [facility]) 08/04/2022   Hyperlipidemia 08/04/2022   Type 2 diabetes mellitus with stage 3a chronic kidney disease, without long-term current use of insulin (HCC) 12/18/2020   Stage 3b chronic kidney disease (HCC) 11/20/2020   Colon cancer screening 11/20/2020   Moderate mixed hyperlipidemia not requiring statin therapy 08/21/2020   Obesity (BMI 35.0-39.9 without comorbidity) 05/13/2020   Delusion (HCC) 05/13/2020   Sleep apnea in adult 05/13/2020   PCP:  Liana Fish, NP Pharmacy:   [facility] - [place], [place] - [address] [place] [address] Phone: [phone] Fax: [phone]     Social Drivers of Health (SDOH) Social History: SDOH Screenings   Food Insecurity: No Food Insecurity (09/09/2024)  Housing: Low Risk  (09/09/2024)  Transportation Needs: No Transportation Needs (09/09/2024)  Utilities: Not At Risk (09/09/2024)  Alcohol Screen: Low Risk  (12/01/2021)  Depression (PHQ2-9): Low Risk  (05/20/2024)  Financial Resource Strain: Low Risk  (05/08/2024)   Received from [facility]  Physical Activity: Insufficiently Active (12/01/2021)  Social Connections: Moderately Integrated (12/01/2021)  Stress: No Stress Concern Present (12/01/2021)  Tobacco Use: Low Risk  (09/08/2024)   SDOH Interventions:     Readmission Risk Interventions      No data to display

## 2024-09-11 DIAGNOSIS — R296 Repeated falls: Secondary | ICD-10-CM | POA: Diagnosis not present

## 2024-09-11 LAB — ABO/RH: ABO/RH(D): O POS

## 2024-09-11 LAB — GLUCOSE, CAPILLARY
Glucose-Capillary: 114 mg/dL — ABNORMAL HIGH (ref 70–99)
Glucose-Capillary: 157 mg/dL — ABNORMAL HIGH (ref 70–99)
Glucose-Capillary: 98 mg/dL (ref 70–99)
Glucose-Capillary: 107 mg/dL — ABNORMAL HIGH (ref 70–99)

## 2024-09-11 LAB — TYPE AND SCREEN
ABO/RH(D): O POS
Antibody Screen: NEGATIVE

## 2024-09-11 MED ORDER — POLYVINYL ALCOHOL 1.4 % OP SOLN
1.0000 [drp] | Freq: Two times a day (BID) | OPHTHALMIC | Status: DC
  Administered 2024-09-11 – 2024-09-24 (×24): 1 [drp] via OPHTHALMIC
  Filled 2024-09-11 (×2): qty 15

## 2024-09-11 MED ORDER — LORATADINE 10 MG PO TABS
10.0000 mg | ORAL_TABLET | Freq: Every day | ORAL | Status: DC
  Administered 2024-09-11 – 2024-09-24 (×12): 10 mg via ORAL
  Filled 2024-09-11 (×12): qty 1

## 2024-09-11 NOTE — Progress Notes (Signed)
 Physical Therapy Treatment Patient Details Name: Edwin [name] MRN: [id number] DOB: 07/16/1966 Today's Date: 09/11/2024   History of Present Illness Pt is a 58 y/o M presenting to ED after sustaining a fall out of bed. C-spine Xrays: Cervical stenosis of spine. Pt with planned neurosurgery  scheduled for 09/12/2024. PMH significant for autism spectrum disorder, CKD, T2DM, HLD, schizoaffective disorder, anxiety. MD assessment includes falling episodes, cervical stenosis of spine, obesity, abnormality of gait.    PT Comments  Pt A&Ox4, pleasant and agreeable to participate in PT tx. Pt was met semi-supine in bed, able to perform BLE exercises with fair tolerance. ModA trunk/BLE assist to achieve sitting EOB. Pt able to maintain sitting at EOB for ~10 minutes during session with steady balance. STS from very elevated EOB with maxAx2, pt initially hunched over RW with knees flexed, improved to full upright with max multimodal cues and encouragement. Pt returned to supine with maxA BLE, pt citing fatigue at end of session. Pt was left supine in bed, family and NT present in room. The patient would benefit from further skilled PT intervention to continue to progress towards goals.      If plan is discharge home, recommend the following: Two people to help with walking and/or transfers;A lot of help with bathing/dressing/bathroom;Assistance with cooking/housework;Direct supervision/assist for medications management;Assist for transportation;Help with stairs or ramp for entrance   Can travel by private vehicle     No  Equipment Recommendations  Other (comment) (TBD at next venue of care)    Recommendations for Other Services       Precautions / Restrictions Precautions Precautions: Fall Recall of Precautions/Restrictions: Intact Restrictions Weight Bearing Restrictions Per Provider Order: No     Mobility  Bed Mobility Overal bed mobility: Needs Assistance Bed Mobility: Supine to Sit, Sit to  Supine     Supine to sit: Mod assist, HOB elevated, Used rails Sit to supine: Max assist   General bed mobility comments: modA for trunk elevation and BLE assist out of bed, maxA to return BLE to bed    Transfers Overall transfer level: Needs assistance Equipment used: Rolling walker (2 wheels) Transfers: Sit to/from Stand Sit to Stand: Max assist, +2 physical assistance, From elevated surface           General transfer comment: maxAx2 STS from very elevated EOB, pt initially unable to straighten knees and was flexed over RW, improved with max multimodal cues for upright posture    Ambulation/Gait                   Stairs             Wheelchair Mobility     Tilt Bed    Modified Rankin (Stroke Patients Only)       Balance Overall balance assessment: Needs assistance Sitting-balance support: Feet supported Sitting balance-Leahy Scale: Good Sitting balance - Comments: steady sitting at EOB   Standing balance support: Bilateral upper extremity supported, Reliant on assistive device for balance Standing balance-Leahy Scale: Poor Standing balance comment: heavy +2 assist to maintain standing                            Communication Communication Communication: No apparent difficulties  Cognition Arousal: Alert Behavior During Therapy: WFL for tasks assessed/performed   PT - Cognitive impairments: No apparent impairments  PT - Cognition Comments: A&Ox4 Following commands: Impaired Following commands impaired: Follows one step commands with increased time    Cueing Cueing Techniques: Verbal cues, Tactile cues, Visual cues  Exercises General Exercises - Lower Extremity Ankle Circles/Pumps: AROM, Both, 10 reps, Supine Heel Slides: AROM, Both, 10 reps, Supine Hip ABduction/ADduction: AROM, Both, 10 reps Hip Flexion/Marching: AROM, Both, 10 reps    General Comments        Pertinent Vitals/Pain Pain  Assessment Pain Assessment: Faces Faces Pain Scale: Hurts little more Pain Location: R knee with movement Pain Descriptors / Indicators: Grimacing, Sore Pain Intervention(s): Limited activity within patient's tolerance, Monitored during session, Repositioned    Home Living                          Prior Function            PT Goals (current goals can now be found in the care plan section) Progress towards PT goals: Progressing toward goals    Frequency    Min 2X/week      PT Plan      Co-evaluation              AM-PAC PT 6 Clicks Mobility   Outcome Measure  Help needed turning from your back to your side while in a flat bed without using bedrails?: A Lot Help needed moving from lying on your back to sitting on the side of a flat bed without using bedrails?: A Lot Help needed moving to and from a bed to a chair (including a wheelchair)?: Total Help needed standing up from a chair using your arms (e.g., wheelchair or bedside chair)?: A Lot Help needed to walk in hospital room?: Total Help needed climbing 3-5 steps with a railing? : Total 6 Click Score: 9    End of Session Equipment Utilized During Treatment: Gait belt Activity Tolerance: Patient tolerated treatment well Patient left: in bed;with call bell/phone within reach;with bed alarm set;with nursing/sitter in room;with family/visitor present Nurse Communication: Mobility status PT Visit Diagnosis: Unsteadiness on feet (R26.81);Muscle weakness (generalized) (M62.81);History of falling (Z91.81);Difficulty in walking, not elsewhere classified (R26.2)     Time: 8596-8575 PT Time Calculation (min) (ACUTE ONLY): 21 min  Charges:    $Therapeutic Exercise: 8-22 mins PT General Charges $$ ACUTE PT VISIT: 1 Visit                     Maylie Ashton, SPT

## 2024-09-11 NOTE — Plan of Care (Signed)

## 2024-09-11 NOTE — Progress Notes (Signed)
 Attending Progress Note  History: Edwin [name] is here for progressive failure to thrive.  He is 58 year old man with rapidly progressing cervical and thoracic myeloradiculopathy.  He is been having progressive weakness numbness and tingling his bilateral upper and lower extremities.  Has had progressive difficulty with ambulation.  Has went from being ambulatory independent to requiring a rollator.  He feels that his legs do not listen to him anymore.  He was scheduled for a anterior cervical discectomy and fusion and posterior thoracic decompression and fusion, however he became too difficult to manage at home and was admitted to the hospital this past weekend.  Physical Exam: Vitals:   09/11/24 0819 09/11/24 1554  BP: 114/83 116/87  Pulse: (!) 104 96  Resp: 19 16  Temp: 99.2 F (37.3 C) 98.7 F (37.1 C)  SpO2: 100% 98%    AA Ox3 CNI  Strength: Overall he has relatively good strength, he does have weak hip flexors bilaterally in comparison to the rest of his musculature, he also has weak deltoids and grip compared to the rest of his musculature bilaterally.  He does have a significant amount of baseline strength though most of his formal testing is in comparison to his intact musculature.  He continues to have hyperreflexia noted in the bilateral lower extremities.  And hyperreflexia in the bilateral upper extremities.  Data:  Recent Labs  Lab 09/08/24 1707 09/09/24 0454  NA 140 141  K 4.1 3.8  CL 104 107  CO2 27 27  BUN 22* 26*  CREATININE 1.75* 1.78*  GLUCOSE 98 86  CALCIUM 8.9 8.6*   Recent Labs  Lab 09/08/24 1707  AST 20  ALT 20  ALKPHOS 104     Recent Labs  Lab 09/08/24 1707 09/09/24 0454  WBC 4.9 4.1  HGB 15.4 14.8  HCT 49.4 46.7  PLT 198 178   No results for input(s): APTT, INR in the last 168 hours.      No results found.  Other tests/results:  Assessment/Plan:  Edwin [name] is a 58 year old gentleman with some developmental delay who  was experiencing a progressive loss of ambulatory function and coordination in his upper extremities.  He was found to have severe stenosis in the cervical spine as well as thoracic spine consistent with cervical and thoracic myeloradiculopathy.  He was planned for a tandem decompression at C3-4 as well as the thoracic spine with an anterior fusion for the former and a posterior fusion for the latter.  He was admitted for failure to thrive preoperatively.  Will plan to go forward with surgery on 09/12/2024.  We reviewed his orders.  He is n.p.o. at midnight.  His heparin stopped at midnight.  Edwin MICAEL Sharps, MD/MSCR Department of Neurosurgery   Addendum: I attempted to reach out to his sister but got the voicemail.  I was able to talk to his mother in person.  Will plan on rounding on them again in the morning to see whether or not they have any discussions prior to the preoperative area

## 2024-09-11 NOTE — TOC Progression Note (Addendum)
 Transition of Care [facility]) - Progression Note    Patient Details  Name: Edwin [name] MRN: [id number] Date of Birth: [birth year]  [facility]) CM/SW Contact  Dalia GORMAN Fuse, RN Phone Number: 09/11/2024, 12:12 PM  Clinical Narrative:    TOC placed call to patient's sister to discuss bed offers, there was no option to leave a message. TOC will continue to follow.  12:30: TOC received a call from the patient's sister Deena. Of the two bed offers, [facility] and [facility], she chooses [facility]. Her first choice would be [facility], but they declined to offer. TOC accepted [facility] in the hub and made the facility aware the offer is accepted. TOC received a message from [place] at [facility] advising they will have a bed when he is medically ready.  Expected Discharge Plan: Skilled Nursing Facility Barriers to Discharge: Continued Medical Work up               Expected Discharge Plan and Services   Discharge Planning Services: CM Consult Post Acute Care Choice: Skilled Nursing Facility Living arrangements for the past 2 months: Single Family Home                                       Social Drivers of Health (SDOH) Interventions SDOH Screenings   Food Insecurity: No Food Insecurity (09/09/2024)  Housing: Low Risk  (09/09/2024)  Transportation Needs: No Transportation Needs (09/09/2024)  Utilities: Not At Risk (09/09/2024)  Alcohol Screen: Low Risk  (12/01/2021)  Depression (PHQ2-9): Low Risk  (05/20/2024)  Financial Resource Strain: Low Risk  (05/08/2024)   Received from [facility]  Physical Activity: Insufficiently Active (12/01/2021)  Social Connections: Moderately Integrated (12/01/2021)  Stress: No Stress Concern Present (12/01/2021)  Tobacco Use: Low Risk  (09/08/2024)    Readmission Risk Interventions     No data to display

## 2024-09-11 NOTE — Plan of Care (Signed)
  Problem: Education: Goal: Knowledge of General Education information will improve Description: Including pain rating scale, medication(s)/side effects and non-pharmacologic comfort measures Outcome: Progressing   Problem: Health Behavior/Discharge Planning: Goal: Ability to manage health-related needs will improve Outcome: Progressing   Problem: Clinical Measurements: Goal: Ability to maintain clinical measurements within normal limits will improve Outcome: Progressing Goal: Will remain free from infection Outcome: Progressing Goal: Diagnostic test results will improve Outcome: Progressing Goal: Respiratory complications will improve Outcome: Progressing Goal: Cardiovascular complication will be avoided Outcome: Progressing   Problem: Activity: Goal: Risk for activity intolerance will decrease Outcome: Progressing   Problem: Nutrition: Goal: Adequate nutrition will be maintained Outcome: Progressing   Problem: Coping: Goal: Level of anxiety will decrease Outcome: Progressing   Problem: Elimination: Goal: Will not experience complications related to bowel motility Outcome: Progressing Goal: Will not experience complications related to urinary retention Outcome: Progressing   Problem: Pain Managment: Goal: General experience of comfort will improve and/or be controlled Outcome: Progressing   Problem: Safety: Goal: Ability to remain free from injury will improve Outcome: Progressing   Problem: Skin Integrity: Goal: Risk for impaired skin integrity will decrease Outcome: Progressing   Problem: Coping: Goal: Ability to adjust to condition or change in health will improve Outcome: Progressing   Problem: Fluid Volume: Goal: Ability to maintain a balanced intake and output will improve Outcome: Progressing   Problem: Health Behavior/Discharge Planning: Goal: Ability to identify and utilize available resources and services will improve Outcome: Progressing Goal:  Ability to manage health-related needs will improve Outcome: Progressing

## 2024-09-11 NOTE — Progress Notes (Signed)
  PROGRESS NOTE    Edwin [name]  FMW:[id number] DOB: 18-Feb-1966 DOA: 09/08/2024 PCP: Liana Fish, NP  102A/102A-AA  LOS: 3 days   Brief hospital course:   Assessment & Plan: 58 year old male with history of GERD, depression, anxiety, hyperlipidemia, morbid obesity, who presents ED for chief concerns of falling episodes at group home.    Falling episodes Suspect secondary to spinal stenosis Patient is not endorsing pain Plan: --definitive tx for spinal stenosis --PT/OT   Cervical stenosis of spine --scheduled surgery with Dr. Claudene tomorrow   Obesity, Class III, BMI 40-49.9 (morbid obesity) (HCC) This complicates overall care and prognosis.    Abnormality of gait Fall precaution   Schizoaffective disorder (HCC) Home risperidone 3 mg nightly, Thioridazine 100 mg p.o. twice daily, escitalopram 10 mg daily  --cont home regimen   Hyperlipidemia --cont statin   Type 2 diabetes mellitus with stage 3a chronic kidney disease, without long-term current use of insulin (HCC) --d/c BG checks, no need   Stage 3b chronic kidney disease (HCC) At baseline   Sleep apnea in adult CPAP nightly ordered   Delusion (HCC) With autism  Allergy --cont claritin and eye drops, per pt and mother request   DVT prophylaxis: SCD/Compression stockings Code Status: Full code  Family Communication: mother updated at bedside today Level of care: Med-Surg Dispo:   The patient is from: group home Anticipated d/c is to: to be determined Anticipated d/c date is: 2-3 days   Subjective and Interval History:  Pt complained of allergy reaction in both eyes.   Objective: Vitals:   09/11/24 0351 09/11/24 0819 09/11/24 1554 09/11/24 1923  BP: 106/79 114/83 116/87 125/81  Pulse: (!) 109 (!) 104 96 94  Resp: 19 19 16    Temp: 99.2 F (37.3 C) 99.2 F (37.3 C) 98.7 F (37.1 C) 98.6 F (37 C)  TempSrc:   Oral Oral  SpO2: 97% 100% 98% 92%  Weight:      Height:         Intake/Output Summary (Last 24 hours) at 09/11/2024 2136 Last data filed at 09/11/2024 1900 Gross per 24 hour  Intake 540 ml  Output --  Net 540 ml   Filed Weights   09/09/24 0336  Weight: (!) 142.2 kg    Examination:   Constitutional: NAD, alert, oriented to person and place HEENT: conjunctivae and lids normal, EOMI CV: No cyanosis.   RESP: normal respiratory effort, on RA   Data Reviewed: I have personally reviewed labs and imaging studies  Time spent: 50 minutes  Ellouise Haber, MD [facility] If 7PM-7AM, please contact night-coverage 09/11/2024, 9:36 PM

## 2024-09-11 NOTE — Care Management Important Message (Signed)
 Important Message  Patient Details  Name: Edwin [name] MRN: [id number] Date of Birth: 08-03-1966   Important Message Given:  Yes - Medicare IM     Marks Scalera W, CMA 09/11/2024, 11:22 AM

## 2024-09-12 ENCOUNTER — Encounter
Admission: EM | Disposition: A | Discharge status: Skilled Nursing Facility | Payer: Self-pay | Source: Home / Self Care | Attending: Hospitalist

## 2024-09-12 ENCOUNTER — Other Ambulatory Visit: Payer: Self-pay

## 2024-09-12 ENCOUNTER — Encounter: Payer: Self-pay | Admitting: Urgent Care

## 2024-09-12 ENCOUNTER — Inpatient Hospital Stay: Admission: RE | Admit: 2024-09-12 | Source: Home / Self Care | Admitting: Neurosurgery

## 2024-09-12 ENCOUNTER — Inpatient Hospital Stay

## 2024-09-12 ENCOUNTER — Inpatient Hospital Stay: Payer: Self-pay | Admitting: Urgent Care

## 2024-09-12 ENCOUNTER — Encounter: Payer: Self-pay | Admitting: Internal Medicine

## 2024-09-12 DIAGNOSIS — R29898 Other symptoms and signs involving the musculoskeletal system: Secondary | ICD-10-CM | POA: Insufficient documentation

## 2024-09-12 DIAGNOSIS — M5104 Intervertebral disc disorders with myelopathy, thoracic region: Secondary | ICD-10-CM

## 2024-09-12 DIAGNOSIS — R200 Anesthesia of skin: Secondary | ICD-10-CM | POA: Insufficient documentation

## 2024-09-12 DIAGNOSIS — G959 Disease of spinal cord, unspecified: Secondary | ICD-10-CM | POA: Insufficient documentation

## 2024-09-12 DIAGNOSIS — M5001 Cervical disc disorder with myelopathy,  high cervical region: Secondary | ICD-10-CM

## 2024-09-12 DIAGNOSIS — M4712 Other spondylosis with myelopathy, cervical region: Secondary | ICD-10-CM | POA: Insufficient documentation

## 2024-09-12 DIAGNOSIS — M4714 Other spondylosis with myelopathy, thoracic region: Secondary | ICD-10-CM | POA: Insufficient documentation

## 2024-09-12 DIAGNOSIS — R292 Abnormal reflex: Secondary | ICD-10-CM | POA: Insufficient documentation

## 2024-09-12 DIAGNOSIS — M5020 Other cervical disc displacement, unspecified cervical region: Secondary | ICD-10-CM | POA: Insufficient documentation

## 2024-09-12 DIAGNOSIS — R269 Unspecified abnormalities of gait and mobility: Secondary | ICD-10-CM

## 2024-09-12 DIAGNOSIS — R296 Repeated falls: Secondary | ICD-10-CM | POA: Diagnosis not present

## 2024-09-12 DIAGNOSIS — Z7409 Other reduced mobility: Secondary | ICD-10-CM

## 2024-09-12 HISTORY — PX: APPLICATION OF INTRAOPERATIVE CT SCAN: SHX6668

## 2024-09-12 HISTORY — PX: ANTERIOR CERVICAL DECOMP/DISCECTOMY FUSION: SHX1161

## 2024-09-12 LAB — MAGNESIUM: Magnesium: 2.1 mg/dL (ref 1.7–2.4)

## 2024-09-12 LAB — BASIC METABOLIC PANEL WITH GFR
Anion gap: 5 (ref 5–15)
BUN: 23 mg/dL — ABNORMAL HIGH (ref 6–20)
CO2: 27 mmol/L (ref 22–32)
Calcium: 8.7 mg/dL — ABNORMAL LOW (ref 8.9–10.3)
Chloride: 109 mmol/L (ref 98–111)
Creatinine, Ser: 1.92 mg/dL — ABNORMAL HIGH (ref 0.61–1.24)
GFR, Estimated: 40 mL/min — ABNORMAL LOW (ref >60)
Glucose, Bld: 97 mg/dL (ref 70–99)
Potassium: 3.9 mmol/L (ref 3.5–5.1)
Sodium: 141 mmol/L (ref 135–145)

## 2024-09-12 LAB — GLUCOSE, CAPILLARY
Glucose-Capillary: 98 mg/dL (ref 70–99)
Glucose-Capillary: 134 mg/dL — ABNORMAL HIGH (ref 70–99)

## 2024-09-12 LAB — CBC
HCT: 45 % (ref 39.0–52.0)
Hemoglobin: 14.7 g/dL (ref 13.0–17.0)
MCH: 27.9 pg (ref 26.0–34.0)
MCHC: 32.7 g/dL (ref 30.0–36.0)
MCV: 85.4 fL (ref 80.0–100.0)
Platelets: 153 K/uL (ref 150–400)
RBC: 5.27 MIL/uL (ref 4.22–5.81)
RDW: 13.7 % (ref 11.5–15.5)
WBC: 5.4 K/uL (ref 4.0–10.5)
nRBC: 0 % (ref 0.0–0.2)

## 2024-09-12 SURGERY — ANTERIOR CERVICAL DECOMPRESSION/DISCECTOMY FUSION 1 LEVEL
Anesthesia: General

## 2024-09-12 MED ORDER — MORPHINE SULFATE (PF) 2 MG/ML IV SOLN
2.0000 mg | INTRAVENOUS | Status: DC | PRN
  Administered 2024-09-12 – 2024-09-16 (×8): 2 mg via INTRAVENOUS
  Filled 2024-09-12 (×8): qty 1

## 2024-09-12 MED ORDER — SODIUM CHLORIDE 0.9 % IV SOLN
INTRAVENOUS | Status: DC | PRN
  Administered 2024-09-12: .1 ug/kg/min via INTRAVENOUS

## 2024-09-12 MED ORDER — VASOPRESSIN 20 UNIT/ML IV SOLN
INTRAVENOUS | Status: AC
  Filled 2024-09-12: qty 1

## 2024-09-12 MED ORDER — REMIFENTANIL HCL 1 MG IV SOLR
INTRAVENOUS | Status: AC
  Filled 2024-09-12: qty 1000

## 2024-09-12 MED ORDER — LIDOCAINE HCL (CARDIAC) PF 100 MG/5ML IV SOSY
PREFILLED_SYRINGE | INTRAVENOUS | Status: DC | PRN
  Administered 2024-09-12: 80 mg via INTRAVENOUS

## 2024-09-12 MED ORDER — PHENYLEPHRINE HCL-NACL 20-0.9 MG/250ML-% IV SOLN
INTRAVENOUS | Status: AC
Start: 2024-09-12 — End: 2024-09-12
  Filled 2024-09-12: qty 250

## 2024-09-12 MED ORDER — PROPOFOL 1000 MG/100ML IV EMUL
INTRAVENOUS | Status: AC
  Filled 2024-09-12: qty 100

## 2024-09-12 MED ORDER — SODIUM CHLORIDE (PF) 0.9 % IJ SOLN
INTRAMUSCULAR | Status: DC | PRN
  Administered 2024-09-12: 30 mL

## 2024-09-12 MED ORDER — FENTANYL CITRATE (PF) 250 MCG/5ML IJ SOLN
INTRAMUSCULAR | Status: AC
  Filled 2024-09-12: qty 5

## 2024-09-12 MED ORDER — SODIUM CHLORIDE 0.9 % IV SOLN: INTRAVENOUS | Status: DC | PRN

## 2024-09-12 MED ORDER — SURGIFLO WITH THROMBIN (HEMOSTATIC MATRIX KIT) OPTIME
TOPICAL | Status: DC | PRN
  Administered 2024-09-12: 1 via TOPICAL

## 2024-09-12 MED ORDER — BUPIVACAINE-EPINEPHRINE (PF) 0.5% -1:200000 IJ SOLN
INTRAMUSCULAR | Status: DC | PRN
  Administered 2024-09-12: 10 mL
  Administered 2024-09-12: 9 mL

## 2024-09-12 MED ORDER — VASOPRESSIN 20 UNIT/ML IV SOLN
INTRAVENOUS | Status: DC | PRN
Start: 2024-09-12 — End: 2024-09-12
  Administered 2024-09-12 (×7): 1 [IU] via INTRAVENOUS

## 2024-09-12 MED ORDER — OXYCODONE HCL 5 MG PO TABS: 5.0000 mg | ORAL_TABLET | Freq: Once | ORAL | Status: DC | PRN

## 2024-09-12 MED ORDER — CEFAZOLIN IN SODIUM CHLORIDE 3-0.9 GM/100ML-% IV SOLN
3.0000 g | Freq: Once | INTRAVENOUS | Status: DC
  Filled 2024-09-12: qty 200
  Filled 2024-09-12: qty 100

## 2024-09-12 MED ORDER — OXYCODONE HCL 5 MG/5ML PO SOLN: 5.0000 mg | Freq: Once | ORAL | Status: DC | PRN

## 2024-09-12 MED ORDER — PHENYLEPHRINE HCL-NACL 20-0.9 MG/250ML-% IV SOLN
INTRAVENOUS | Status: DC | PRN
  Administered 2024-09-12: 25 ug/min via INTRAVENOUS

## 2024-09-12 MED ORDER — 0.9 % SODIUM CHLORIDE (POUR BTL) OPTIME
TOPICAL | Status: DC | PRN
  Administered 2024-09-12: 250 mL
  Administered 2024-09-12: 500 mL

## 2024-09-12 MED ORDER — PROPOFOL 1000 MG/100ML IV EMUL
INTRAVENOUS | Status: AC
  Filled 2024-09-12: qty 200

## 2024-09-12 MED ORDER — ALBUMIN HUMAN 5 % IV SOLN
INTRAVENOUS | Status: AC
  Filled 2024-09-12: qty 250

## 2024-09-12 MED ORDER — PROPOFOL 1000 MG/100ML IV EMUL
INTRAVENOUS | Status: AC
Start: 2024-09-12 — End: 2024-09-12
  Filled 2024-09-12: qty 100

## 2024-09-12 MED ORDER — OXYCODONE HCL 5 MG PO TABS
5.0000 mg | ORAL_TABLET | ORAL | Status: DC | PRN
  Administered 2024-09-12: 10 mg via ORAL
  Administered 2024-09-13: 5 mg via ORAL
  Administered 2024-09-14: 10 mg via ORAL
  Administered 2024-09-17 – 2024-09-18 (×3): 5 mg via ORAL
  Filled 2024-09-12: qty 1
  Filled 2024-09-12 (×2): qty 2
  Filled 2024-09-12 (×3): qty 1

## 2024-09-12 MED ORDER — CEFAZOLIN SODIUM 1 G IJ SOLR
INTRAMUSCULAR | Status: AC
  Filled 2024-09-12: qty 30

## 2024-09-12 MED ORDER — HYDROMORPHONE HCL 1 MG/ML IJ SOLN: 0.2500 mg | INTRAMUSCULAR | Status: DC | PRN

## 2024-09-12 MED ORDER — VANCOMYCIN HCL IN DEXTROSE 1-5 GM/200ML-% IV SOLN
1000.0000 mg | Freq: Once | INTRAVENOUS | Status: AC
  Administered 2024-09-12: 1000 mg via INTRAVENOUS
  Filled 2024-09-12: qty 200

## 2024-09-12 MED ORDER — DEXMEDETOMIDINE HCL IN NACL 80 MCG/20ML IV SOLN
INTRAVENOUS | Status: DC | PRN
Start: 2024-09-12 — End: 2024-09-12
  Administered 2024-09-12: 4 ug via INTRAVENOUS
  Administered 2024-09-12: 8 ug via INTRAVENOUS
  Administered 2024-09-12: 4 ug via INTRAVENOUS

## 2024-09-12 MED ORDER — MIDAZOLAM HCL 2 MG/2ML IJ SOLN
INTRAMUSCULAR | Status: AC
  Filled 2024-09-12: qty 2

## 2024-09-12 MED ORDER — VANCOMYCIN HCL IN DEXTROSE 1-5 GM/200ML-% IV SOLN
INTRAVENOUS | Status: AC
  Filled 2024-09-12: qty 200

## 2024-09-12 MED ORDER — ALBUMIN HUMAN 5 % IV SOLN: INTRAVENOUS | Status: DC | PRN

## 2024-09-12 MED ORDER — PROPOFOL 10 MG/ML IV BOLUS
INTRAVENOUS | Status: DC | PRN
  Administered 2024-09-12: 150 ug/kg/min via INTRAVENOUS
  Administered 2024-09-12: 200 mg via INTRAVENOUS

## 2024-09-12 MED ORDER — ONDANSETRON HCL 4 MG/2ML IJ SOLN
INTRAMUSCULAR | Status: AC
Start: 2024-09-12 — End: 2024-09-12
  Filled 2024-09-12: qty 2

## 2024-09-12 MED ORDER — LACTATED RINGERS IV SOLN: INTRAVENOUS | Status: DC | PRN

## 2024-09-12 MED ORDER — IRRISEPT - 450ML BOTTLE WITH 0.05% CHG IN STERILE WATER, USP 99.95% OPTIME
TOPICAL | Status: DC | PRN
  Administered 2024-09-12: 450 mL

## 2024-09-12 MED ORDER — DEXAMETHASONE SODIUM PHOSPHATE 10 MG/ML IJ SOLN
INTRAMUSCULAR | Status: DC | PRN
  Administered 2024-09-12: 5 mg via INTRAVENOUS

## 2024-09-12 MED ORDER — MIDAZOLAM HCL 2 MG/2ML IJ SOLN
INTRAMUSCULAR | Status: DC | PRN
  Administered 2024-09-12: 2 mg via INTRAVENOUS

## 2024-09-12 MED ORDER — VANCOMYCIN HCL 1 G IV SOLR
INTRAVENOUS | Status: DC | PRN
  Administered 2024-09-12: 1000 mg via TOPICAL

## 2024-09-12 MED ORDER — EPHEDRINE SULFATE-NACL 50-0.9 MG/10ML-% IV SOSY
PREFILLED_SYRINGE | INTRAVENOUS | Status: DC | PRN
  Administered 2024-09-12 (×2): 5 mg via INTRAVENOUS
  Administered 2024-09-12: 10 mg via INTRAVENOUS
  Administered 2024-09-12: 5 mg via INTRAVENOUS

## 2024-09-12 MED ORDER — PROPOFOL 10 MG/ML IV BOLUS
INTRAVENOUS | Status: AC
  Filled 2024-09-12: qty 20

## 2024-09-12 MED ORDER — CEFAZOLIN SODIUM-DEXTROSE 3-4 GM/150ML-% IV SOLN
3.0000 g | Freq: Once | INTRAVENOUS | Status: AC
  Administered 2024-09-12 (×2): 3 g via INTRAVENOUS
  Filled 2024-09-12 (×2): qty 150

## 2024-09-12 MED ORDER — SUCCINYLCHOLINE CHLORIDE 200 MG/10ML IV SOSY
PREFILLED_SYRINGE | INTRAVENOUS | Status: DC | PRN
  Administered 2024-09-12: 160 mg via INTRAVENOUS

## 2024-09-12 MED ORDER — FENTANYL CITRATE (PF) 100 MCG/2ML IJ SOLN
INTRAMUSCULAR | Status: DC | PRN
  Administered 2024-09-12 (×2): 50 ug via INTRAVENOUS

## 2024-09-12 SURGICAL SUPPLY — 61 items
ALLOGRAFT BONESTRIP KORE 2.5X5 (Bone Implant) IMPLANT
BASIN KIT SINGLE STR (MISCELLANEOUS) ×1 IMPLANT
BIT DRILL ACP 13 (DRILL) IMPLANT
BRUSH SCRUB EZ 4% CHG (MISCELLANEOUS) ×1 IMPLANT
BUR NEURO DRILL SOFT 3.0X3.8M (BURR) ×1 IMPLANT
DERMABOND ADVANCED .7 DNX12 (GAUZE/BANDAGES/DRESSINGS) ×1 IMPLANT
DRAIN CHANNEL JP 10F RND 20C F (MISCELLANEOUS) IMPLANT
DRAPE C-ARM 42X70 (DRAPES) IMPLANT
DRAPE C-ARM XRAY 36X54 (DRAPES) ×2 IMPLANT
DRAPE LAPAROTOMY 100X77 ABD (DRAPES) ×1 IMPLANT
DRAPE LAPAROTOMY 77X122 PED (DRAPES) ×1 IMPLANT
DRAPE SCAN PATIENT (DRAPES) ×1 IMPLANT
DRAPE TABLE BACK 80X90 (DRAPES) ×1 IMPLANT
DRSG TEGADERM 2-3/8X2-3/4 SM (GAUZE/BANDAGES/DRESSINGS) IMPLANT
ELECTRODE REM PT RTRN 9FT ADLT (ELECTROSURGICAL) ×1 IMPLANT
EVACUATOR 1/8 PVC DRAIN (DRAIN) IMPLANT
EVACUATOR SILICONE 100CC (DRAIN) IMPLANT
EX-PIN ORTHOLOCK NAV 4X150 (PIN) IMPLANT
FEE CVG SUPP BRAINLAB NG SPNE (MISCELLANEOUS) IMPLANT
FEE INTRAOP CADWELL SUPPLY NCS (MISCELLANEOUS) IMPLANT
FEE INTRAOP MONITOR IMPULS NCS (MISCELLANEOUS) IMPLANT
GAUZE 4X4 16PLY ~~LOC~~+RFID DBL (SPONGE) IMPLANT
GLOVE BIOGEL PI IND STRL 7.0 (GLOVE) ×1 IMPLANT
GLOVE BIOGEL PI IND STRL 8 (GLOVE) ×2 IMPLANT
GLOVE SURG SYN 7.0 PF PI (GLOVE) ×2 IMPLANT
GLOVE SURG SYN 7.5 PF PI (GLOVE) ×2 IMPLANT
GOWN SRG LRG LVL 4 IMPRV REINF (GOWNS) ×1 IMPLANT
GOWN SRG XL LVL 3 NONREINFORCE (GOWNS) ×1 IMPLANT
GOWN STRL REUS W/ TWL XL LVL3 (GOWN DISPOSABLE) ×1 IMPLANT
HOLDER FOLEY CATH W/STRAP (MISCELLANEOUS) IMPLANT
KIT PREVENA INCISION MGT 13 (CANNISTER) IMPLANT
KIT SPINAL PRONEVIEW (KITS) ×1 IMPLANT
KIT TURNOVER KIT A (KITS) ×1 IMPLANT
LAVAGE JET IRRISEPT WOUND (IRRIGATION / IRRIGATOR) ×1 IMPLANT
MANIFOLD NEPTUNE II (INSTRUMENTS) ×1 IMPLANT
MARKER SKIN DUAL TIP RULER LAB (MISCELLANEOUS) ×1 IMPLANT
MARKER SPHERE PSV REFLC 13MM (MARKER) ×7 IMPLANT
NDL SAFETY ECLIP 18X1.5 (MISCELLANEOUS) ×1 IMPLANT
NS IRRIG 500ML POUR BTL (IV SOLUTION) ×1 IMPLANT
PACK LAMINECTOMY ARMC (PACKS) ×1 IMPLANT
PAD ARMBOARD POSITIONER FOAM (MISCELLANEOUS) ×2 IMPLANT
PIN CASPAR 14 (PIN) ×1 IMPLANT
PLATE ACP 1.6X16 1LVL (Plate) IMPLANT
ROD RELINE-O 5.5X100 LORD (Rod) IMPLANT
SCREW ACP 3.5X17 (Screw) IMPLANT
SCREW LOCK RELINE 5.5 TULIP (Screw) IMPLANT
SCREW RELINE 5.0X30MM POLY (Screw) IMPLANT
SCREW RELINE 5.0X35MM POLY 2S (Screw) IMPLANT
SCREW RELINE-O POLY 5.0X40 (Screw) IMPLANT
SPACER CERVICAL FRGE 12X14X6-7 (Spacer) IMPLANT
SPONGE KITTNER 5P (MISCELLANEOUS) ×1 IMPLANT
STAPLER SKIN PROX 35W (STAPLE) IMPLANT
SURGIFLO W/THROMBIN 8M KIT (HEMOSTASIS) ×1 IMPLANT
SUT VIC AB 0 CT1 27XCR 8 STRN (SUTURE) ×2 IMPLANT
SUT VIC AB 2-0 CT1 18 (SUTURE) ×2 IMPLANT
SUT VIC AB 3-0 SH 8-18 (SUTURE) ×1 IMPLANT
SUTURE EHLN 3-0 FS-10 30 BLK (SUTURE) IMPLANT
SYR 20ML LL LF (SYRINGE) ×1 IMPLANT
SYR 30ML LL (SYRINGE) ×2 IMPLANT
TAPE CLOTH 3X10 WHT NS LF (GAUZE/BANDAGES/DRESSINGS) ×2 IMPLANT
TRAP FLUID SMOKE EVACUATOR (MISCELLANEOUS) ×1 IMPLANT

## 2024-09-12 NOTE — Transfer of Care (Signed)
 Immediate Anesthesia Transfer of Care Note  Patient: Edwin [name]  Procedure(s) Performed: C3-4 anterior cervical discectomy and fusion T2-T3 right sided transpedicular decompression with laminectomy, T1-T4 posterior spinal instrumentation and fusion APPLICATION OF INTRAOPERATIVE CT SCAN  Patient Location: PACU  Anesthesia Type:General  Level of Consciousness: sedated  Airway & Oxygen Therapy: Patient Spontanous Breathing and Patient connected to face mask oxygen  Post-op Assessment: Report given to RN and Post -op Vital signs reviewed and stable  Post vital signs: stable  Last Vitals:  Vitals Value Taken Time  BP 118/75 09/12/24 16:39  Temp 36.5 C 09/12/24 16:41  Pulse 100 09/12/24 16:44  Resp 14 09/12/24 16:44  SpO2 100 % 09/12/24 16:44  Vitals shown include unfiled device data.  Last Pain:  Vitals:   09/12/24 0835  TempSrc: Temporal  PainSc: 0-No pain         Complications: No notable events documented.

## 2024-09-12 NOTE — Progress Notes (Signed)
  PROGRESS NOTE    Edwin [name]  FMW:[id number] DOB: [birth year] DOA: 09/08/2024 PCP: Liana Fish, NP  102A/102A-AA  LOS: 4 days   Brief hospital course:   Assessment & Plan: 58 year old male with history of GERD, depression, anxiety, hyperlipidemia, morbid obesity, who presents ED for chief concerns of falling episodes at group home.    Falling episodes Suspect secondary to spinal stenosis Patient is not endorsing pain Plan: --definitive tx for spinal stenosis --PT/OT   Severe cervical and thoracic myeloradiculopathy  --underwent C3-4 anterior cervical discectomy and fusion  --underwent T2-T3 right sided transpedicular decompression with laminectomy, T1-T4 posterior spinal instrumentation and fusion  --drain management --pain management   Obesity, Class III, BMI 40-49.9 (morbid obesity) (HCC) This complicates overall care and prognosis.    Abnormality of gait Fall precaution   Schizoaffective disorder (HCC) Home risperidone 3 mg nightly, Thioridazine 100 mg p.o. twice daily, escitalopram 10 mg daily  --cont home regimen   Hyperlipidemia --cont statin   Type 2 diabetes mellitus with stage 3a chronic kidney disease, without long-term current use of insulin (HCC) --d/c BG checks, no need   Stage 3b chronic kidney disease (HCC) At baseline   Sleep apnea in adult CPAP nightly ordered   Delusion (HCC) With autism  Allergy --cont claritin and eye drops, per pt and mother request   DVT prophylaxis: SCD/Compression stockings Code Status: Full code  Family Communication: family updated at bedside today Level of care: Med-Surg Dispo:   The patient is from: group home Anticipated d/c is to: to be determined Anticipated d/c date is: 2-3 days   Subjective and Interval History:  Underwent cervical and thoracic spine surgery today.  Had post-op pain.   Objective: Vitals:   09/12/24 1715 09/12/24 1730 09/12/24 1742 09/12/24 1817  BP: (!) 154/94 135/71   (!) 142/71  Pulse: (!) 102 97 99 98  Resp: 12 (!) 9 10 16   Temp:   (!) 97.3 F (36.3 C) 97.8 F (36.6 C)  TempSrc:    Axillary  SpO2: 97% 96% 97% 95%  Weight:      Height:        Intake/Output Summary (Last 24 hours) at 09/12/2024 1840 Last data filed at 09/12/2024 1749 Gross per 24 hour  Intake 2980 ml  Output 1700 ml  Net 1280 ml   Filed Weights   09/09/24 0336 09/12/24 0835  Weight: (!) 142.2 kg (!) 142.2 kg    Examination:   Constitutional: NAD CV: No cyanosis.   RESP: normal respiratory effort, on RA   Data Reviewed: I have personally reviewed labs and imaging studies  Time spent: 35 minutes  Ellouise Haber, MD [facility] If 7PM-7AM, please contact night-coverage 09/12/2024, 6:40 PM

## 2024-09-12 NOTE — Progress Notes (Signed)
 Patient was resting comfortably this morning on floor rounds.  Will plan to see him in the preoperative area.

## 2024-09-12 NOTE — Anesthesia Preprocedure Evaluation (Addendum)
 Anesthesia Evaluation  Patient identified by MRN, date of birth, ID band Patient awake    Reviewed: Allergy & Precautions, NPO status , Patient's Chart, lab work & pertinent test results  History of Anesthesia Complications Negative for: history of anesthetic complications  Airway Mallampati: I  TM Distance: >3 FB Neck ROM: full    Dental no notable dental hx.    Pulmonary sleep apnea    Pulmonary exam normal        Cardiovascular negative cardio ROS Normal cardiovascular exam     Neuro/Psych negative neurological ROS  negative psych ROS   GI/Hepatic Neg liver ROS,GERD  ,,  Endo/Other  diabetes, Type 2  Class 3 obesity  Renal/GU CRFRenal disease     Musculoskeletal   Abdominal   Peds  Hematology negative hematology ROS (+)   Anesthesia Other Findings Past Medical History: No date: Diabetes mellitus without complication (HCC) No date: Hypercholesteremia No date: Medical history non-contributory  Past Surgical History: 11/23/2023: BIOPSY     Comment:  Procedure: BIOPSY;  Surgeon: Therisa Bi, MD;  Location:              [facility] ENDOSCOPY;  Service: Gastroenterology;; 04/17/2024: COLONOSCOPY; N/A     Comment:  Procedure: COLONOSCOPY;  Surgeon: Therisa Bi, MD;                Location: [facility] ENDOSCOPY;  Service: Gastroenterology;                Laterality: N/A; 11/23/2023: COLONOSCOPY WITH PROPOFOL ; N/A     Comment:  Procedure: COLONOSCOPY WITH PROPOFOL ;  Surgeon: Therisa Bi, MD;  Location: [facility] ENDOSCOPY;  Service:               Gastroenterology;  Laterality: N/A; No date: ESOPHAGOGASTRODUODENOSCOPY (EGD) WITH PROPOFOL  11/23/2023: ESOPHAGOGASTRODUODENOSCOPY (EGD) WITH PROPOFOL ; N/A     Comment:  Procedure: ESOPHAGOGASTRODUODENOSCOPY (EGD) WITH               PROPOFOL ;  Surgeon: Therisa Bi, MD;  Location: [facility]               ENDOSCOPY;  Service: Gastroenterology;  Laterality: N/A; 11/23/2023:  POLYPECTOMY     Comment:  Procedure: POLYPECTOMY;  Surgeon: Therisa Bi, MD;                Location: [facility] ENDOSCOPY;  Service: Gastroenterology;; 04/17/2024: POLYPECTOMY     Comment:  Procedure: POLYPECTOMY, INTESTINE;  Surgeon: Therisa Bi, MD;  Location: [facility] ENDOSCOPY;  Service:               Gastroenterology;;  BMI    Body Mass Index: 40.23 kg/m      Reproductive/Obstetrics negative OB ROS                              Anesthesia Physical Anesthesia Plan  ASA: 3  Anesthesia Plan: General ETT   Post-op Pain Management: Toradol IV (intra-op)*, Ofirmev  IV (intra-op)* and Dilaudid IV   Induction: Intravenous  PONV Risk Score and Plan: 2 and Ondansetron , Dexamethasone, Midazolam and Treatment may vary due to age or medical condition  Airway Management Planned: Oral ETT  Additional Equipment:   Intra-op Plan:   Post-operative Plan: Extubation in OR  Informed Consent: I have reviewed the patients History and Physical, chart, labs  and discussed the procedure including the risks, benefits and alternatives for the proposed anesthesia with the patient or authorized representative who has indicated his/her understanding and acceptance.     Dental Advisory Given  Plan Discussed with: Anesthesiologist, CRNA and Surgeon  Anesthesia Plan Comments: (Patient consented for risks of anesthesia including but not limited to:  - adverse reactions to medications - damage to eyes, teeth, lips or other oral mucosa - nerve damage due to positioning  - sore throat or hoarseness - Damage to heart, brain, nerves, lungs, other parts of body or loss of life  Patient voiced understanding and assent.)         Anesthesia Quick Evaluation

## 2024-09-12 NOTE — Op Note (Addendum)
 Indications: 58 year old man with a history of rapidly progressive cervical and thoracic myeloradiculopathy.  His workup included cross-sectional imaging which demonstrated severe stenosis at C3-4 with a strong myelomalacia signal at this level and also with severe stenosis at T2 and T3 secondary to calcified ligamentum flavum and severe spinal cord compression.  He developed severe rapid progression and I was unable to be cared for due to his inability to ambulate and lower extremity weakness.  Findings: Severe compression of the cervical spinal cord at C3-4, well decompressed at the end of the procedure, large calcified thoracic ligamentum flavum causing severe/critical stenosis of the thoracic spine at T2 and T3 well decompressed at the end of the procedure.    Preoperative Diagnosis: Severe cervical and thoracic myeloradiculopathy  Postoperative Diagnosis: Severe cervical and thoracic myeloradiculopathy   EBL: 600 mL IVF: see anesthesia report Drains: 1 posterior subfascial hemovac drain to bulb suction Disposition: Extubated and Stable to PACU Complications: none  No foley catheter was placed.  Preoperative Note:   Risks of surgery discussed include: infection, bleeding, stroke, coma, death, paralysis, CSF leak, nerve/spinal cord injury, numbness, tingling, weakness, complex regional pain syndrome, recurrent stenosis and/or disc herniation, vascular injury, development of instability, neck/back pain, need for further surgery, persistent symptoms, development of deformity, and the risks of anesthesia. The patient  and family understood these risks and agreed to proceed.  Procedure:  1) Anterior cervical diskectomy and fusion at C3-4 2) Anterior cervical instrumentation at C3-4 3) Structural allograft consisting of corticocancellous allograft 4) T2 Laminectomy 5) T3 Transpedicular Decompression 6) T1-T4 Posterior Spinal Instrumentation 7) T1-T4 Arthrodesis with Allograft and  Autograft 8) Intraoperative Navigation   Procedure: After obtaining informed consent, the patient taken to the operating room, placed in supine position, general anesthesia induced.   The patient received preop antibiotics and IV Decadron.  The patient had a neck incision outlined, was prepped and draped in usual sterile fashion. The incision was injected with local anesthetic.   An incision was opened, dissection taken down medial to the carotid artery and jugular vein, lateral to the trachea and esophagus.  The prevertebral fascia identified and a localizing x-ray demonstrated the correct level.  The longus colli were dissected laterally, and self-retaining retractors placed to open the operative field. The microscope was then brought into the field.  With this complete, distractor pins were placed in the vertebral bodies of C 3 and C 4. The distractor was placed, and the annulus at C 3/4 was opened using a bovie.  Curettes and pituitary rongeurs used to remove the majority of disk, then the drill was used to remove the posterior osteophyte and begin the foraminotomies. The nerve hook was used to elevate the posterior longitudinal ligament, which was then removed with Kerrison rongeurs. The microblunt nerve hook could be passed out the foramen bilaterally.   Meticulous hemostasis obtained.  Structural allograft was tapped behind the anterior lip of the vertebral body at C 3/4 (6 mm).    The caspar distractor was removed, and bone wax used for hemostasis. A separate, 16 mm plate was chosen.  Two screws placed in each vertebral body, respectively making sure the screws were behind the locking mechanism.  Final AP and lateral radiographs were taken.   With everything in good position, the wound was irrigated copiously and meticulous hemostasis obtained.  Wound was closed in 2 layers using interrupted inverted 3-0 Vicryl sutures.  The wound was dressed with dermabond, the head of bed at 30 degrees, taken to  recovery room in stable condition.  No new postop neurological deficits were identified.  Sponge and pattie counts were correct at the end of the procedure.   After the anterior portion of the procedure was done.  The drapes were taken down and the patient was moved to the stretcher.  The beds were exchanged and a Leonce frame was brought into the room and the patient was placed prone with all the pressure points padded  Next, a midline incision was planned.  The patient was prepped and draped in the usual fashion.  All the pressure points were padded.  After a comprehensive timeout verifying the patient's name, MRN, planned procedure, and images local anesthetic was injected into the midline incision.  It was opened sharply.  Was brought down to the level of the thoracic fascia.  Then a subperiosteal dissection was used from T1 to T4in order to expose the posterior spinal elements.  At each level we identified the spinous process brought the dissection down to the level of the lamina and expose the medial aspect of the transverse process.  At the most cranial level we took care not to violate the facet capsule.  After satisfactory exposure has been obtained our attention turned towards instrumentation.  At this point we fixated the navigation reference frame to the most inferior spinous process.  We fixed this into place and ensure that it was in good standing.  We then covered the patient in a sterile fashion and prepared for the three-dimensional C arm image acquisition.  We performed an image acquisition and afterwards checked the reference.  The reference was slightly out of view for the inferior portion of the procedure so a repeat scan was taken.  Once imaging was adequate we then turned our attention towards placing pedicle screws.  First using a navigated drill guide we planned the for screws.  On the left side we started at T1, we cannulated the first pedicle utilizing a navigated drill guide,  we then used a ball-tipped probe to palpate for breaches.  Once this was acceptable then we used a navigated awl tap and again palpated with a ball-tipped probe.  We then went on to place an screw at T1 that was measured off of the image acquisition.  The purchase was good.  We repeated this process at T2, T3, and T4 on the left.  Once these were in good position we then repeated the process on the right side at T1 and T4.  The same steps were performed at T2 and T3 up to the level of navigated awl tap.  This was in preparation for possible transpedicular decompression.    We then turned our attention to the decompression which was going to spanned the T3 laminar space and the T2 laminar space.  We verified this with the intraoperative navigation to evaluate for the locations of the calcified ligamentum flavum.  This required a decompression from the inferior aspect of T1, the entire aspect of T2 and a majority of the T3 laminar space.  Utilizing a high-speed drill we were able to cut off the spinous process and harvest them for autograft.  We then used lysed the high-speed drill to remove the lamina of T2 and the lamina of T3.  We removed some of the posterior elements of T1 in order to gain safe access to the large calcified ligamentum flavum on the right.  There is some ligamentum flavum that was still of normal appearance on the left side at  the T3 level however the entire T2 level had been replaced by a large calcified fragment.  Given the critical nature of the stenosis we are unable to safely utilize rongeurs at the level of the T3 pedicle so a partial resection of the pedicle was performed just greater than 50%.  This gave us  lateral access to the main portion of the lesion and we were able to disconnect the ligamentum flavum and retracted laterally in order to avoid any worsening compression of the spinal cord at this level during resection.  We were able to successfully elevate the fragment and continue  to move caudally to resect the remainder of the calcified ligamentum at the T3 level distal to the pedicle.  Once the decompression was done we are able to place the pedicle screw at the T2 pedicle on the right.  We chose to not place the right sided T3 pedicle screw as this did not have a strong purchase given the pedicle resection.  Utilizing high-speed drill we decorticated these levels between T1 and T4.  We irrigated copiously with regular irrigation followed by Irrisept.  We then placed our precut and pre-bent rods.  We placed the screw caps and final tightened.  we placed a mixture of allograft autograft and vancomycin powder at the levels that were prepared for arthrodesis.  We then obtained meticulous hemostasis.  We then placed a subfascial drain.  We closed in multiple layers.  Staples were utilized on the most superficial layer.  The wound drain was secured into place with a nylon.  The Prevena was utilized over top of the wound after it had been cleaned.  The counts were correct, no immediate complications.  [name], MD   I performed the procedure with the assistance of [facility], they aided in the exposure, retraction of critical structures, visualization of critical structures, stabilization during instrumentation, and closure.  Implants: Implant Name Type Inv. Item Serial No. Manufacturer Lot No. LRB No. Used Action  SPACER CERVICAL FRGE [id number] - [id number] Spacer SPACER CERVICAL FRGE [id number] [facility] MEDICAL [id number] DONOR N/A 1 Implanted  PLATE ACP 8.3K83 1LVL - [id number] Plate PLATE ACP 8.3K83 1LVL  [employer]  N/A 1 Implanted  SCREW ACP 3.5X17 - [id number] Screw SCREW ACP 3.5X17  [employer]  N/A 4 Implanted  ALLOGRAFT BONESTRIP KORE 2.5X5 - [id number] Bone Implant ALLOGRAFT BONESTRIP KORE 2.5X5 [id number] MUSCULOSKELETL TRANSPLANT FNDN  N/A 1 Implanted  ALLOGRAFT BONESTRIP KORE 2.5X5 - [id number] Bone Implant  ALLOGRAFT BONESTRIP KORE 2.5X5 [id number] MUSCULOSKELETL TRANSPLANT FNDN  N/A 1 Implanted  SCREW RELINE 5.0X30MM POLY - [id number] Screw SCREW RELINE 5.0X30MM POLY  [employer]  N/A 2 Implanted  SCREW RELINE 5.0X35MM POLY 2S - [id number] Screw SCREW RELINE 5.0X35MM POLY 2S  [employer]  N/A 4 Implanted  SCREW RELINE-O POLY 5.0X40 - [id number] Screw SCREW RELINE-O POLY 5.0X40  [employer]  N/A 1 Implanted  ROD RELINE-O 5.5X100 LORD - [id number] Rod ROD RELINE-O 5.5X100 LORD  [employer]  N/A 2 Implanted  SCREW LOCK RELINE 5.5 TULIP - [id number] Screw SCREW LOCK RELINE 5.5 TULIP  [employer]  N/A 7 Implanted    [name], MD/MSCR

## 2024-09-12 NOTE — Plan of Care (Signed)
  Problem: Education: Goal: Knowledge of General Education information will improve Description: Including pain rating scale, medication(s)/side effects and non-pharmacologic comfort measures Outcome: Progressing   Problem: Health Behavior/Discharge Planning: Goal: Ability to manage health-related needs will improve Outcome: Progressing   Problem: Clinical Measurements: Goal: Ability to maintain clinical measurements within normal limits will improve Outcome: Progressing Goal: Will remain free from infection Outcome: Progressing Goal: Diagnostic test results will improve Outcome: Progressing Goal: Respiratory complications will improve Outcome: Progressing Goal: Cardiovascular complication will be avoided Outcome: Progressing   Problem: Activity: Goal: Risk for activity intolerance will decrease Outcome: Progressing   Problem: Nutrition: Goal: Adequate nutrition will be maintained Outcome: Progressing   Problem: Coping: Goal: Level of anxiety will decrease Outcome: Progressing   Problem: Elimination: Goal: Will not experience complications related to bowel motility Outcome: Progressing Goal: Will not experience complications related to urinary retention Outcome: Progressing   Problem: Pain Managment: Goal: General experience of comfort will improve and/or be controlled Outcome: Progressing   Problem: Safety: Goal: Ability to remain free from injury will improve Outcome: Progressing   Problem: Skin Integrity: Goal: Risk for impaired skin integrity will decrease Outcome: Progressing   Problem: Education: Goal: Ability to describe self-care measures that may prevent or decrease complications (Diabetes Survival Skills Education) will improve Outcome: Progressing Goal: Individualized Educational Video(s) Outcome: Progressing   Problem: Fluid Volume: Goal: Ability to maintain a balanced intake and output will improve Outcome: Progressing   Problem: Health  Behavior/Discharge Planning: Goal: Ability to identify and utilize available resources and services will improve Outcome: Progressing Goal: Ability to manage health-related needs will improve Outcome: Progressing   Problem: Metabolic: Goal: Ability to maintain appropriate glucose levels will improve Outcome: Progressing   Problem: Nutritional: Goal: Maintenance of adequate nutrition will improve Outcome: Progressing Goal: Progress toward achieving an optimal weight will improve Outcome: Progressing   Problem: Skin Integrity: Goal: Risk for impaired skin integrity will decrease Outcome: Progressing   Problem: Tissue Perfusion: Goal: Adequacy of tissue perfusion will improve Outcome: Progressing

## 2024-09-12 NOTE — Interval H&P Note (Signed)
 History and Physical Interval Note:  09/12/2024 9:20 AM  Edwin [name]  has presented today for surgery, with the diagnosis of Severe cervical and thoracic myeloradiculopathy.  The various methods of treatment have been discussed with the patient and family. After consideration of risks, benefits and other options for treatment, the patient has consented to  Procedure(s): C3-4 anterior cervical discectomy and fusion (N/A) T2-T3 right sided transpedicular decompression with laminectomy, T1-T4 posterior spinal instrumentation and fusion (N/A) APPLICATION OF INTRAOPERATIVE CT SCAN (N/A) as a surgical intervention.  The patient's history has been reviewed, patient examined, no change in status, stable for surgery.  I have reviewed the patient's chart and labs.  Questions were answered to the patient's satisfaction.    Patient had progressive weakness and was admitted over the weekend.  They were optimized while admitted for surgery.  Is immediately preoperative exam does show progressive weakness and has bilateral hand intrinsics which is now approximately 4- out of 5, he has some weakness in his deltoids as well.  In his lower extremities his right hip flexor is now approximately 2-3 out of 5 in his left hip flexor as 3 out of 5.  He is had a progressive foot drop in his right lower extremity and is approximately 2-3 out of 5 as well.  Will continue the same plan for surgery as planned in the clinic given his myelomalacia notable at C3-4 will plan for C3-4 anterior cervical discectomy and fusion, as well as a T2-3 decompression and T1-T4 posterior spinal fusion.  Risks and benefits were discussed with the family.  They like to go forward given his rapidly progressive decompensation.  Heart and lungs clear   Edwin [name]

## 2024-09-12 NOTE — Anesthesia Procedure Notes (Signed)
 Procedure Name: Intubation Date/Time: 09/12/2024 10:01 AM  Performed by: Lennie Lamarr HERO, CRNAPre-anesthesia Checklist: Patient identified, Emergency Drugs available, Suction available and Patient being monitored Patient Re-evaluated:Patient Re-evaluated prior to induction Oxygen Delivery Method: Circle System Utilized Preoxygenation: Pre-oxygenation with 100% oxygen Induction Type: IV induction Ventilation: Mask ventilation without difficulty Laryngoscope Size: McGrath and 4 Grade View: Grade I Tube type: Oral Tube size: 7.5 mm Number of attempts: 1 Airway Equipment and Method: Stylet and Oral airway Placement Confirmation: ETT inserted through vocal cords under direct vision, positive ETCO2 and breath sounds checked- equal and bilateral Secured at: 22 cm Tube secured with: Tape Dental Injury: Teeth and Oropharynx as per pre-operative assessment

## 2024-09-12 NOTE — Progress Notes (Signed)
 OT Cancellation Note  Patient Details Name: Edwin [name] MRN: [id number] DOB: 1966-02-02   Cancelled Treatment:    Reason Eval/Treat Not Completed: Other (comment) Patient with scheduled ACDF 09/12/2024  will hold and re-evaluate as appropriate  Rogers Clause, OT/L MSOT, 09/12/2024   Maryelizabeth CHRISTELLA Clause 09/12/2024, 8:21 AM

## 2024-09-12 NOTE — Progress Notes (Signed)
 Dr. Claudene at bedside assessing BUE grips and BLE strengths.  Per Dr. Claudene, assessment is satisfactory.

## 2024-09-12 NOTE — Brief Op Note (Signed)
 09/12/2024  10:41 PM  PATIENT:  Edwin [name]  58 y.o. male  PRE-OPERATIVE DIAGNOSIS:  Severe cervical and thoracic myeloradiculopathy  POST-OPERATIVE DIAGNOSIS:  Severe cervical and thoracic myeloradiculopathy  PROCEDURE:  Procedure(s): C3-4 anterior cervical discectomy and fusion (N/A) TT3 right sided transpedicular decompression with laminectomy, T1-T4 posterior spinal instrumentation and fusion (N/A) APPLICATION OF INTRAOPERATIVE CT SCAN (N/A)  SURGEON:  Surgeons and Role:    DEWAINE Claudene Penne LELON, MD - Primary  ASSISTANTS: Lyle Decamp, PA-C   ANESTHESIA:   general  EBL:  600 mL   BLOOD ADMINISTERED:none  DRAINS: (1) Hemovact drain(s) in the Posterior subfascial space with  Suction Open   LOCAL MEDICATIONS USED:  BUPIVICAINE   SPECIMEN:  No Specimen and Lumpectomy with ALND  DISPOSITION OF SPECIMEN:  N/A  COUNTS:  YES  TOURNIQUET:  * No tourniquets in log *  DICTATION: .Dragon Dictation  PLAN OF CARE:  Return to Floor Okay to be up with PT/OT No Bracing Needed Record Drain Output   PATIENT DISPOSITION:  Return to Floor   Delay start of Pharmacological VTE agent (>24hrs) due to surgical blood loss or risk of bleeding: no

## 2024-09-13 ENCOUNTER — Inpatient Hospital Stay

## 2024-09-13 ENCOUNTER — Encounter: Payer: Self-pay | Admitting: Neurosurgery

## 2024-09-13 DIAGNOSIS — I2699 Other pulmonary embolism without acute cor pulmonale: Secondary | ICD-10-CM

## 2024-09-13 DIAGNOSIS — R296 Repeated falls: Secondary | ICD-10-CM | POA: Diagnosis not present

## 2024-09-13 LAB — BRAIN NATRIURETIC PEPTIDE: B Natriuretic Peptide: 31.3 pg/mL (ref 0.0–100.0)

## 2024-09-13 LAB — CBC WITH DIFFERENTIAL/PLATELET
Abs Immature Granulocytes: 0.04 K/uL (ref 0.00–0.07)
Basophils Absolute: 0 K/uL (ref 0.0–0.1)
Basophils Relative: 0 %
Eosinophils Absolute: 0 K/uL (ref 0.0–0.5)
Eosinophils Relative: 0 %
HCT: 38.7 % — ABNORMAL LOW (ref 39.0–52.0)
Hemoglobin: 12.6 g/dL — ABNORMAL LOW (ref 13.0–17.0)
Immature Granulocytes: 0 %
Lymphocytes Relative: 8 %
Lymphs Abs: 0.8 K/uL (ref 0.7–4.0)
MCH: 27.9 pg (ref 26.0–34.0)
MCHC: 32.6 g/dL (ref 30.0–36.0)
MCV: 85.8 fL (ref 80.0–100.0)
Monocytes Absolute: 1.4 K/uL — ABNORMAL HIGH (ref 0.1–1.0)
Monocytes Relative: 14 %
Neutro Abs: 7.7 K/uL (ref 1.7–7.7)
Neutrophils Relative %: 78 %
Platelets: 132 K/uL — ABNORMAL LOW (ref 150–400)
RBC: 4.51 MIL/uL (ref 4.22–5.81)
RDW: 13.4 % (ref 11.5–15.5)
WBC: 10 K/uL (ref 4.0–10.5)
nRBC: 0 % (ref 0.0–0.2)

## 2024-09-13 LAB — D-DIMER, QUANTITATIVE: D-Dimer, Quant: 18.51 ug{FEU}/mL — ABNORMAL HIGH (ref 0.00–0.50)

## 2024-09-13 LAB — COMPREHENSIVE METABOLIC PANEL WITH GFR
ALT: 19 U/L (ref 0–44)
AST: 29 U/L (ref 15–41)
Albumin: 3.2 g/dL — ABNORMAL LOW (ref 3.5–5.0)
Alkaline Phosphatase: 81 U/L (ref 38–126)
Anion gap: 11 (ref 5–15)
BUN: 25 mg/dL — ABNORMAL HIGH (ref 6–20)
CO2: 23 mmol/L (ref 22–32)
Calcium: 8.3 mg/dL — ABNORMAL LOW (ref 8.9–10.3)
Chloride: 104 mmol/L (ref 98–111)
Creatinine, Ser: 1.59 mg/dL — ABNORMAL HIGH (ref 0.61–1.24)
GFR, Estimated: 50 mL/min — ABNORMAL LOW (ref >60)
Glucose, Bld: 127 mg/dL — ABNORMAL HIGH (ref 70–99)
Potassium: 3.9 mmol/L (ref 3.5–5.1)
Sodium: 138 mmol/L (ref 135–145)
Total Bilirubin: 0.9 mg/dL (ref 0.0–1.2)
Total Protein: 6.3 g/dL — ABNORMAL LOW (ref 6.5–8.1)

## 2024-09-13 LAB — PROCALCITONIN: Procalcitonin: <0.1 ng/mL

## 2024-09-13 LAB — LACTIC ACID, PLASMA: Lactic Acid, Venous: 1 mmol/L (ref 0.5–1.9)

## 2024-09-13 MED ORDER — SODIUM CHLORIDE 0.9 % IV BOLUS
250.0000 mL | Freq: Once | INTRAVENOUS | Status: AC
  Administered 2024-09-13: 250 mL via INTRAVENOUS

## 2024-09-13 MED ORDER — PHENOL 1.4 % MT LIQD
1.0000 | OROMUCOSAL | Status: DC | PRN
  Administered 2024-09-14 – 2024-09-17 (×2): 1 via OROMUCOSAL
  Filled 2024-09-13: qty 177

## 2024-09-13 MED ORDER — IOHEXOL 350 MG/ML SOLN
100.0000 mL | Freq: Once | INTRAVENOUS | Status: AC | PRN
  Administered 2024-09-14: 100 mL via INTRAVENOUS

## 2024-09-13 MED ORDER — CHLORHEXIDINE GLUCONATE CLOTH 2 % EX PADS
6.0000 | MEDICATED_PAD | Freq: Every day | CUTANEOUS | Status: DC
  Administered 2024-09-13: 6 via TOPICAL

## 2024-09-13 NOTE — Evaluation (Signed)
 Occupational Therapy Evaluation Patient Details Name: Edwin [name] MRN: [id number] DOB: 04-21-1966 Today's Date: 09/13/2024   History of Present Illness   Pt is a 58 y/o M with some developmental delay presented to ED after sustaining a fall out of bed. C-spine Xrays: Cervical stenosis of spine.who was experiencing a progressive loss of ambulatory function and coordination in his upper extremities.  He was found to have severe stenosis in the cervical spine as well as thoracic spine consistent with cervical and thoracic myeloradiculopathy. On 09/12/24, pt underwent Anterior cervical diskectomy and fusion at C3-4, Anterior cervical instrumentation at C3-4, Structural allograft consisting of corticocancellous allograft, T2 Laminectomy, T3 Transpedicular Decompression, T1-T4 Posterior Spinal Instrumentation, T1-T4 Arthrodesis with Allograft and Autograft.     Clinical Impressions Pt seen for OT/PT Co-Re-evaluation this date for the above mentioned procedure which occurred on 09/12/24 . Prior to hospital admission, pt was living in a group home with support from staff and required increased assistance with ADLs, was Mod I with mobility utilizing a rollator. At re-evaluation, pt currently required Mod A for UB ADLs, Max A for LB ADLs, Mod A x 2 for bed mobility, Mod A x2 for STS transfers, and Min A utilizing Stedy frame.  Pt able to engage in grooming task seated EOB with up to Min A following initial set-up. Pt educated in back precautions , self care skills, bed mobility and functional transfer training, and falls prevention strategies to maximize safety and functional independence while minimizing falls risk and maintaining precautions. Anticipate the need for follow up OT services upon acute hospital DC.      If plan is discharge home, recommend the following:   Two people to help with walking and/or transfers;A lot of help with bathing/dressing/bathroom     Functional Status Assessment    Patient has had a recent decline in their functional status and demonstrates the ability to make significant improvements in function in a reasonable and predictable amount of time.     Equipment Recommendations   Other (comment) (defer to next venue of care)     Recommendations for Other Services         Precautions/Restrictions   Precautions Precautions: Fall;Back;Cervical Recall of Precautions/Restrictions: Impaired Restrictions Weight Bearing Restrictions Per Provider Order: No     Mobility Bed Mobility Overal bed mobility: Needs Assistance Bed Mobility: Supine to Sit Rolling: Mod assist, +2 for physical assistance, Used rails   Supine to sit: Mod assist, +2 for physical assistance, Used rails          Transfers Overall transfer level: Needs assistance Equipment used: Rolling walker (2 wheels) Transfers: Sit to/from Stand Sit to Stand: Mod assist, +2 physical assistance, From elevated surface           General transfer comment: Mod A x2 for STS at [facility], [place] with Min A with significant verbal cuing for technique to use BUEs on bar.  Max multimodal cues for upright posture at RW and while utilizing Stedy. Transfer via Lift Equipment: Stedy    Balance Overall balance assessment: Needs assistance Sitting-balance support: Feet supported, Bilateral upper extremity supported, Single extremity supported Sitting balance-Leahy Scale: Fair Sitting balance - Comments: sitting at EOB required cuing for feet positioning   Standing balance support: Bilateral upper extremity supported, Reliant on assistive device for balance Standing balance-Leahy Scale: Poor Standing balance comment: Mod A x2 at [facility] for static standing with multimodal cuing for upright standing posture.  ADL either performed or assessed with clinical judgement   ADL Overall ADL's : Needs assistance/impaired Eating/Feeding: Sitting;Set up   Grooming: Wash/dry  face;Sitting;Minimal assistance           Upper Body Dressing : Moderate assistance;Sitting Upper Body Dressing Details (indicate cue type and reason): simulated Lower Body Dressing: Maximal assistance;Adhering to back precautions   Toilet Transfer: Maximal assistance;Rolling walker (2 wheels);+2 for physical assistance Toilet Transfer Details (indicate cue type and reason): simulation Toileting- Clothing Manipulation and Hygiene: Maximal assistance Toileting - Clothing Manipulation Details (indicate cue type and reason): simulation     Functional mobility during ADLs: Maximal assistance;+2 for physical assistance;Rolling walker (2 wheels)       Vision Patient Visual Report: No change from baseline       Perception         Praxis         Pertinent Vitals/Pain Pain Assessment Pain Assessment: 0-10 Faces Pain Scale: Hurts even more Pain Location: Neck and upper back at surgical site Pain Descriptors / Indicators: Grimacing, Sore, Guarding Pain Intervention(s): Monitored during session     Extremity/Trunk Assessment Upper Extremity Assessment Upper Extremity Assessment: Generalized weakness   Lower Extremity Assessment Lower Extremity Assessment: Generalized weakness   Cervical / Trunk Assessment Cervical / Trunk Assessment: Neck Surgery;Back Surgery   Communication Communication Communication: No apparent difficulties   Cognition Arousal: Alert Behavior During Therapy: WFL for tasks assessed/performed Cognition: History of cognitive impairments             OT - Cognition Comments: delayed processing but answers questions appropriately. Disoriented to time only                 Following commands: Impaired Following commands impaired: Follows one step commands with increased time     Cueing  General Comments   Cueing Techniques: Verbal cues;Tactile cues;Visual cues      Exercises Other Exercises Other Exercises: education on use of BUEs  during functional transfers and with use of Stedy frame to support transfers   Shoulder Instructions      Home Living Family/patient expects to be discharged to:: Group home Living Arrangements: Group Home Available Help at Discharge: Available 24 hours/day                                    Prior Functioning/Environment Prior Level of Function : Needs assist             Mobility Comments: MOD I using rollator unbtil last ~week reporting increased falls ADLs Comments: pt has required increased assistance with ADLs recently    OT Problem List: Decreased strength;Decreased range of motion;Decreased activity tolerance;Impaired balance (sitting and/or standing);Decreased safety awareness   OT Treatment/Interventions: Self-care/ADL training;Therapeutic exercise;Energy conservation;DME and/or AE instruction;Therapeutic activities;Patient/family education;Balance training      OT Goals(Current goals can be found in the care plan section)   Acute Rehab OT Goals Patient Stated Goal: to walk OT Goal Formulation: With patient/family Time For Goal Achievement: 09/27/24 Potential to Achieve Goals: Good ADL Goals Pt Will Perform Grooming: with supervision;standing Pt Will Perform Upper Body Dressing: with modified independence;sitting Pt Will Perform Lower Body Dressing: sit to/from stand;with contact guard assist Pt Will Transfer to Toilet: bedside commode;with supervision Pt Will Perform Toileting - Clothing Manipulation and hygiene: with supervision;sit to/from stand   OT Frequency:  Min 2X/week    Co-evaluation PT/OT/SLP Co-Evaluation/Treatment: Yes Reason for Co-Treatment: For patient/therapist  safety;To address functional/ADL transfers PT goals addressed during session: Mobility/safety with mobility;Balance OT goals addressed during session: ADL's and self-care      AM-PAC OT 6 Clicks Daily Activity     Outcome Measure Help from another person eating  meals?: None Help from another person taking care of personal grooming?: A Little Help from another person toileting, which includes using toliet, bedpan, or urinal?: A Lot Help from another person bathing (including washing, rinsing, drying)?: A Lot Help from another person to put on and taking off regular upper body clothing?: A Lot Help from another person to put on and taking off regular lower body clothing?: Total 6 Click Score: 14   End of Session Equipment Utilized During Treatment: Rolling walker (2 wheels) Nurse Communication: Mobility status  Activity Tolerance: Patient tolerated treatment well Patient left: in chair;with call bell/phone within reach;with chair alarm set;with family/visitor present  OT Visit Diagnosis: Other abnormalities of gait and mobility (R26.89);Muscle weakness (generalized) (M62.81)                Time: 8662-8594 OT Time Calculation (min): 28 min Charges:  OT General Charges $OT Visit: 1 Visit OT Evaluation $OT Eval Low Complexity: 1 Low OT Treatments $Therapeutic Activity: 8-22 mins  Harlene Sharps OTR/L   Harlene LITTIE Sharps 09/13/2024, 3:00 PM

## 2024-09-13 NOTE — Evaluation (Signed)
 Physical Therapy Re-Evaluation Patient Details Name: Edwin [name] MRN: [id number] DOB: 1966/07/13 Today's Date: 09/13/2024  History of Present Illness  Pt is a 58 y/o M admitted on 09/08/24 after presenting with c/o falls at his group home. Pt with severe cervical & thoracic myeloradiculopathy. Pt is s/p C3-4 anterior cervical discectomy & fusion, T3 R sided transpedicular decompression with laminectomy, T1-4 posterior spinal instrumentation & fusion 09/12/24. PMH: GERD, depression, anxiety, HLD, morbid obesity, Autism spectrum disorder, CKD, DM2  Clinical Impression  Pt seen for PT re-evaluation s/p procedure with pt's mother & sister present for session. Pt very pleasant, agreeable to participation. PT educated pt on back precautions. Pt is able to complete bed mobility with mod assist +2, sit>stand from EOB with RW & mod assist +2 but from stedy seat with min assist +2. Pt attempts taking side steps at EOB but with knee buckling noted so utilized stedy for bed>recliner transfer for increased safety with mobility. Pt would benefit from ongoing skilled PT treatment to progress mobility as able. Recommend post acute rehab >3 hours therapy/day upon d/c.        If plan is discharge home, recommend the following: Two people to help with walking and/or transfers;Two people to help with bathing/dressing/bathroom;Help with stairs or ramp for entrance;Assistance with cooking/housework;Direct supervision/assist for financial management;Assist for transportation;Supervision due to cognitive status   Can travel by private vehicle   No    Equipment Recommendations None recommended by PT (defer to next venue)  Recommendations for Other Services  Rehab consult    Functional Status Assessment Patient has had a recent decline in their functional status and demonstrates the ability to make significant improvements in function in a reasonable and predictable amount of time.     Precautions / Restrictions  Precautions Precautions: Fall;Back;Cervical Restrictions Weight Bearing Restrictions Per Provider Order: No      Mobility  Bed Mobility Overal bed mobility: Needs Assistance Bed Mobility: Rolling, Sidelying to Sit Rolling: Mod assist, +2 for physical assistance, Used rails Sidelying to sit: Mod assist, Used rails, HOB elevated, +2 for physical assistance, +2 for safety/equipment       General bed mobility comments: cuing re: log rolling technique, assistance to move BLE to EOB & upright trunk    Transfers Overall transfer level: Needs assistance   Transfers: Sit to/from Stand Sit to Stand: Mod assist, +2 physical assistance, From elevated surface           General transfer comment: sit<>stand from EOB with RW & mod assist +2, cuing re: hand placement, assistance to power up to standing, cuing for upright posture (upright trunk, shift pelvis anteriorly); pt is able to transfer sit>stand from stedy seat with min assist +2. Transfer via Lift Equipment: Stedy  Ambulation/Gait               General Gait Details: Attempted side steps along EOB to R with RW & +2 assist, pt able to step R with each LE x 1 but experiences BLE buckling so PT deferred further stepping at this time for safety  Stairs            Wheelchair Mobility     Tilt Bed    Modified Rankin (Stroke Patients Only)       Balance Overall balance assessment: Needs assistance Sitting-balance support: Feet supported, Bilateral upper extremity supported Sitting balance-Leahy Scale: Fair     Standing balance support: Bilateral upper extremity supported, Reliant on assistive device for balance, During functional activity Standing balance-Leahy  Scale: Poor                               Pertinent Vitals/Pain Pain Assessment Pain Assessment: Faces Faces Pain Scale: Hurts whole lot Pain Location: Neck and upper back at surgical site, shoulders Pain Descriptors / Indicators:  Grimacing, Sore, Guarding Pain Intervention(s): Monitored during session, Patient requesting pain meds-RN notified    Home Living Family/patient expects to be discharged to:: Group home Living Arrangements: Group Home Available Help at Discharge: Available 24 hours/day                    Prior Function Prior Level of Function : Needs assist             Mobility Comments: MOD I using rollator unbtil last ~week reporting increased falls ADLs Comments: pt has required increased assistance with ADLs recently     Extremity/Trunk Assessment   Upper Extremity Assessment Upper Extremity Assessment: Generalized weakness    Lower Extremity Assessment Lower Extremity Assessment: Generalized weakness    Cervical / Trunk Assessment Cervical / Trunk Assessment: Neck Surgery;Back Surgery  Communication   Communication Communication: Impaired Factors Affecting Communication: Reduced clarity of speech    Cognition Arousal: Alert Behavior During Therapy: WFL for tasks assessed/performed                           PT - Cognition Comments: pleasant Following commands: Impaired Following commands impaired: Follows one step commands with increased time     Cueing Cueing Techniques: Verbal cues, Tactile cues, Visual cues     General Comments General comments (skin integrity, edema, etc.): wound vac, drain intact throughout session    Exercises     Assessment/Plan    PT Assessment Patient needs continued PT services  PT Problem List Decreased strength;Decreased activity tolerance;Decreased balance;Decreased mobility       PT Treatment Interventions DME instruction;Gait training;Functional mobility training;Therapeutic activities;Therapeutic exercise;Balance training;Neuromuscular re-education;Stair training;Cognitive remediation;Patient/family education;Modalities;Manual techniques    PT Goals (Current goals can be found in the Care Plan section)  Acute Rehab  PT Goals Patient Stated Goal: to get better PT Goal Formulation: With patient/family Time For Goal Achievement: 09/27/24 Potential to Achieve Goals: Good    Frequency 7X/week     Co-evaluation PT/OT/SLP Co-Evaluation/Treatment: Yes Reason for Co-Treatment: For patient/therapist safety;To address functional/ADL transfers PT goals addressed during session: Mobility/safety with mobility;Balance OT goals addressed during session: ADL's and self-care       AM-PAC PT 6 Clicks Mobility  Outcome Measure Help needed turning from your back to your side while in a flat bed without using bedrails?: A Lot Help needed moving from lying on your back to sitting on the side of a flat bed without using bedrails?: Total Help needed moving to and from a bed to a chair (including a wheelchair)?: Total Help needed standing up from a chair using your arms (e.g., wheelchair or bedside chair)?: A Lot Help needed to walk in hospital room?: Total Help needed climbing 3-5 steps with a railing? : Total 6 Click Score: 8    End of Session   Activity Tolerance: Patient tolerated treatment well Patient left: in chair;with call bell/phone within reach;with family/visitor present Nurse Communication: Mobility status PT Visit Diagnosis: Unsteadiness on feet (R26.81);Muscle weakness (generalized) (M62.81);History of falling (Z91.81);Difficulty in walking, not elsewhere classified (R26.2);Other abnormalities of gait and mobility (R26.89);Pain Pain - part of body:  (  neck, back)    Time: 8662-8594 PT Time Calculation (min) (ACUTE ONLY): 28 min   Charges:   PT Evaluation $PT Re-evaluation: 1 Re-eval   PT General Charges $$ ACUTE PT VISIT: 1 Visit         Richerd Pinal, PT, DPT 09/13/24, 3:49 PM   Richerd CHRISTELLA Pinal 09/13/2024, 3:46 PM

## 2024-09-13 NOTE — Discharge Instructions (Signed)
 Your surgeon has performed an operation on your cervical and thoracic spine (neck and back) to relieve pressure on the spinal cord and/or nerves. This involved making an incision in the front of your neck and removing one or more of the discs that support your spine. Next, a small piece of bone, a titanium plate, and screws were used to fuse two or more of the vertebrae (bones) together. In your back you have two rods and several screws.  The following are instructions to help in your recovery once you have been discharged from the hospital. Even if you feel well, it is important that you follow these activity guidelines. If you do not let your neck and back heal properly from the surgery, you can increase the chance of return of your symptoms and other complications.  * Do not take anti-inflammatory medications for 3 months after surgery (naproxen [Aleve], ibuprofen  [Advil , Motrin ], celecoxib [Celebrex], etc.). These medications can prevent your bones from healing properly.  Activity    No bending, lifting, or twisting ("BLT"). Avoid lifting objects heavier than 10 pounds (gallon milk jug).  Where possible, avoid household activities that involve lifting, bending, reaching, pushing, or pulling such as laundry, vacuuming, grocery shopping, and childcare. Try to arrange for help from friends and family for these activities while your back heals.  Increase physical activity slowly as tolerated.  Taking short walks is encouraged, but avoid strenuous exercise. Do not jog, run, bicycle, lift weights, or participate in any other exercises unless specifically allowed by your doctor.  Talk to your doctor before resuming sexual activity.  You should not drive until cleared by your doctor.  Until released by your doctor, you should not return to work or school.  You should rest at home and let your body heal.   You may shower three days after your surgery.  After showering, lightly dab your incision dry.  Do not take a tub bath or go swimming until approved by your doctor at your follow-up appointment.  If your doctor ordered a cervical collar (neck brace) for you, you should wear it whenever you are out of bed. You may remove it when lying down or sleeping, but you should wear it at all other times. Not all neck surgeries require a cervical collar.  If you smoke, we strongly recommend that you quit.  Smoking has been proven to interfere with normal bone healing and will dramatically reduce the success rate of your surgery. Please contact [facility] (800-QUIT-NOW) and use the resources at www.[facility].com for assistance in stopping smoking.  Surgical Incision   If you have a dressing on your incision, you may remove it two days after your surgery. Keep your incision area clean and dry.  If you have staples or stitches on your incision, you should have a follow up scheduled for removal. If you do not have staples or stitches, you will have steri-strips (small pieces of surgical tape) or Dermabond glue. The steri-strips/glue should begin to peel away within about a week (it is fine if the steri-strips fall off before then). If the strips are still in place one week after your surgery, you may gently remove them.  Diet           You may return to your usual diet. However, you may experience discomfort when swallowing in the first month after your surgery. This is normal. You may find that softer foods are more comfortable for you to swallow. Be sure to stay hydrated.  You  have been prescribed narcotic pain medications.  This often will cause constipation along with the anesthesia that you underwent.  Please obtain Colace and senna. This has been sent to your local pharmacy, but at times it is not covered by insurance and you may obtain it over the counter..  You should take a stool softener and laxative for the duration of you taking the narcotic pain medications.  When to Contact Us   You may  experience pain in your neck and/or pain between your shoulder blades. This is normal and should improve in the next few weeks with the help of pain medication, muscle relaxers, and rest. Some patients report that a warm compress on the back of the neck or between the shoulder blades helps.  However, should you experience any of the following, contact us  immediately: New numbness or weakness Pain that is progressively getting worse, and is not relieved by your pain medication, muscle relaxers, rest, and warm compresses Bleeding, redness, swelling, pain, or drainage from surgical incision Chills or flu-like symptoms Fever greater than 101.0 F (38.3 C) Inability to eat, drink fluids, or take medications Problems with bowel or bladder functions Difficulty breathing or shortness of breath Warmth, tenderness, or swelling in your calf Contact Information How to contact us :  If you have any questions/concerns before or after surgery, you can reach us  at [phone], or you can send a mychart message. We can be reached by phone or mychart 8am-4pm, Monday-Friday.  *Please note: Calls after 4pm are forwarded to a third party answering service. Mychart messages are not routinely monitored during evenings, weekends, and holidays. Please call our office to contact the answering service for urgent concerns during non-business hours.

## 2024-09-13 NOTE — Progress Notes (Signed)
 Attending Progress Note  History: Edwin [name] is here for progressive failure to thrive.  He is 58 year old man with rapidly progressing cervical and thoracic myeloradiculopathy.  He is been having progressive weakness numbness and tingling his bilateral upper and lower extremities.  Has had progressive difficulty with ambulation.  Has went from being ambulatory independent to requiring a rollator.  He feels that his legs do not listen to him anymore.  He was scheduled for a anterior cervical discectomy and fusion and posterior thoracic decompression and fusion, however he became too difficult to manage at home and was admitted to the hospital this past weekend.   POD1: Had pain overnight  Physical Exam: Vitals:   09/13/24 0600 09/13/24 0836  BP:  121/89  Pulse: (!) 119 (!) 118  Resp:  20  Temp:  99.2 F (37.3 C)  SpO2:  95%    AA Ox3 CNI  Strength:   He is more than antigravity throughout.  Slow but 5/5 BUE LLE 5/5 RLE 4=4+ but effort dependent  Drain >300  Data:  Recent Labs  Lab 09/08/24 1707 09/09/24 0454 09/12/24 0425  NA 140 141 141  K 4.1 3.8 3.9  CL 104 107 109  CO2 27 27 27   BUN 22* 26* 23*  CREATININE 1.75* 1.78* 1.92*  GLUCOSE 98 86 97  CALCIUM 8.9 8.6* 8.7*   Recent Labs  Lab 09/08/24 1707  AST 20  ALT 20  ALKPHOS 104     Recent Labs  Lab 09/08/24 1707 09/09/24 0454 09/12/24 0425  WBC 4.9 4.1 5.4  HGB 15.4 14.8 14.7  HCT 49.4 46.7 45.0  PLT 198 178 153   No results for input(s): APTT, INR in the last 168 hours.       Assessment/Plan:  Edwin [name] is a 58 year old gentleman with some developmental delay who was experiencing a progressive loss of ambulatory function and coordination in his upper extremities.  He was found to have severe stenosis in the cervical spine as well as thoracic spine consistent with cervical and thoracic myeloradiculopathy.   - Continue drain - OK to work with PTOT - Pain control   Edwin Daisy MD Department of Neurosurgery

## 2024-09-13 NOTE — Progress Notes (Addendum)
 CROSS COVER NOTE  NAME: Edwin [name] MRN: [id number] DOB : [birth year]    Concern as stated by nurse / staff   pt who had cervical and thoracic surgery yesterday has been having persistent HR of 118-120. No higher. BP is 142/74. Hasn't been bad. We've been keeping pain controlled. .. Family reached out to caregiver and I don't think this is baseline. He is having some shortness of breath. Supposed to go on cpap shortly.    HR currently 124; BP 131/73 RR 20 O2 94% RA. He has refused CPAP tonight.      Pertinent findings on chart review: Admitted for falling episodes with severe cervical and thoracic myeloradiculopathy undergoing ACDF 10/9   Patient assessment    09/14/2024    4:19 AM 09/13/2024   10:37 PM 09/13/2024    6:55 PM  Vitals with BMI  Systolic 142 131 857  Diastolic 77 73 74  Pulse 117 124 119   Physical Exam Vitals and nursing note reviewed.  Constitutional:      General: He is not in acute distress.    Comments: Mother at bedside  HENT:     Head: Normocephalic and atraumatic.  Cardiovascular:     Rate and Rhythm: Regular rhythm. Tachycardia present.     Heart sounds: Normal heart sounds.  Pulmonary:     Effort: Pulmonary effort is normal.     Breath sounds: Normal breath sounds.  Abdominal:     Palpations: Abdomen is soft.     Tenderness: There is no abdominal tenderness.  Neurological:     Mental Status: Mental status is at baseline.    Patient with persistent tachycardia following a trial of an IV fluid bolus   Workup    09/13/2024   10:37 PM 09/13/2024    6:55 PM 09/13/2024    8:36 AM  Vitals with BMI  Systolic 131 142 878  Diastolic 73 74 89  Pulse 124 119 118      Latest Ref Rng & Units 09/12/2024    4:25 AM 09/09/2024    4:54 AM 09/08/2024    5:07 PM  CBC  WBC 4.0 - 10.5 K/uL 5.4  4.1  4.9   Hemoglobin 13.0 - 17.0 g/dL 85.2  85.1  84.5   Hematocrit 39.0 - 52.0 % 45.0  46.7  49.4   Platelets 150 - 400 K/uL 153  178  198     Lactic Acid, Venous    Component Value Date/Time   LATICACIDVEN 1.0 09/13/2024 2114   D-Dimer 18    Latest Ref Rng & Units 09/13/2024    9:14 PM 09/12/2024    4:25 AM 09/09/2024    4:54 AM  CMP  Glucose 70 - 99 mg/dL 872  97  86   BUN 6 - 20 mg/dL 25  23  26    Creatinine 0.61 - 1.24 mg/dL 8.40  8.07  8.21   Sodium 135 - 145 mmol/L 138  141  141   Potassium 3.5 - 5.1 mmol/L 3.9  3.9  3.8   Chloride 98 - 111 mmol/L 104  109  107   CO2 22 - 32 mmol/L 23  27  27    Calcium 8.9 - 10.3 mg/dL 8.3  8.7  8.6   Total Protein 6.5 - 8.1 g/dL 6.3     Total Bilirubin 0.0 - 1.2 mg/dL 0.9     Alkaline Phos 38 - 126 U/L 81     AST 15 -  41 U/L 29     ALT 0 - 44 U/L 19      CTA chest IMPRESSION: Numerous bilateral pulmonary emboli involving all lobes of both lungs. No evidence of right heart strain.  Bibasilar atelectasis or early infarcts.   Assessment and  Interventions   Assessment:  Bilateral pulmonary emboli without heart strain -Patient with persistent tachycardia.  -Septic workup  tonight negative.   -Tachycardia persistent following trial of fluid bolus.  -D-dimer elevated to 18,  -CTA subsequently ordered showing bilateral PE  Plan: Findings discussed with Dr. Clois who advised that in the setting of life-threatening illness, must proceed with anticoagulation in spite of risks of bleeding complications from recent surgery on 10/9 Findings on CT and plan for anticoagulation with benefits and risks discussed with mother at bedside and then subsequently with patient's sister over the phone-they voiced understanding and gave agreement to proceed with anticoagulation Heparin drip for treatment of multiple bilateral emboli initiated Will order echocardiogram for in the a.m. Bilateral lower extremity venous ultrasounds ordered      CRITICAL CARE Performed by: Delayne LULLA Solian   Total critical care time: Cardiac70 minutes  Critical care time was exclusive of separately  billable procedures and treating other patients.  Critical care was necessary to treat or prevent imminent or life-threatening deterioration.  Critical care was time spent personally by me on the following activities: development of treatment plan with patient and/or surrogate as well as nursing, discussions with consultants, evaluation of patient's response to treatment, examination of patient, obtaining history from patient or surrogate, ordering and performing treatments and interventions, ordering and review of laboratory studies, ordering and review of radiographic studies, pulse oximetry and re-evaluation of patient's condition.

## 2024-09-13 NOTE — Progress Notes (Signed)

## 2024-09-13 NOTE — Progress Notes (Signed)
  PROGRESS NOTE    Edwin [name]  FMW:[id number] DOB: 05/14/1966 DOA: 09/08/2024 PCP: Liana Fish, NP  [id number]/[id number]-AA  LOS: 5 days   Brief hospital course:   Assessment & Plan: 58 year old male with history of GERD, depression, anxiety, hyperlipidemia, morbid obesity, who presents ED for chief concerns of falling episodes at group home.    Falling episodes Suspect secondary to spinal stenosis Patient is not endorsing pain Plan: --definitive tx for spinal stenosis --PT/OT   Severe cervical and thoracic myeloradiculopathy  --underwent C3-4 anterior cervical discectomy and fusion  --underwent T2-T3 right sided transpedicular decompression with laminectomy, T1-T4 posterior spinal instrumentation and fusion  --drain management --pain management --PT/OT   Obesity, Class III, BMI 40-49.9 (morbid obesity) (HCC) This complicates overall care and prognosis.    Abnormality of gait Fall precaution   Schizoaffective disorder (HCC) Home risperidone 3 mg nightly, Thioridazine 100 mg p.o. twice daily, escitalopram 10 mg daily  --cont home regimen   Hyperlipidemia --cont statin   Type 2 diabetes mellitus with stage 3a chronic kidney disease, without long-term current use of insulin (HCC) --no need for BG checks   Stage 3b chronic kidney disease (HCC) At baseline   Sleep apnea in adult CPAP nightly ordered   Delusion (HCC) With autism  Allergy --cont claritin and eye drops, per pt and mother request   DVT prophylaxis: SCD/Compression stockings Code Status: Full code  Family Communication: family updated at bedside today Level of care: Med-Surg Dispo:   The patient is from: group home Anticipated d/c is to: to be determined Anticipated d/c date is: 2-3 days   Subjective and Interval History:  Still having pain, but improved.   Objective: Vitals:   09/13/24 0428 09/13/24 0600 09/13/24 0836 09/13/24 1855  BP: (!) 144/95  121/89 (!) 142/74  Pulse: (!) 120 (!)  119 (!) 118 (!) 119  Resp: 16  20 20   Temp: 98.6 F (37 C)  99.2 F (37.3 C) 98.3 F (36.8 C)  TempSrc:      SpO2: 95%  95% 94%  Weight:      Height:        Intake/Output Summary (Last 24 hours) at 09/13/2024 1951 Last data filed at 09/13/2024 1703 Gross per 24 hour  Intake 120 ml  Output 3125 ml  Net -3005 ml   Filed Weights   09/09/24 0336 09/12/24 0835  Weight: (!) 142.2 kg (!) 142.2 kg    Examination:   Constitutional: NAD, alert, oriented HEENT: conjunctivae and lids normal, EOMI CV: No cyanosis.   RESP: normal respiratory effort, on RA   Data Reviewed: I have personally reviewed labs and imaging studies  Time spent: 35 minutes  Ellouise Haber, MD [facility] If 7PM-7AM, please contact night-coverage 09/13/2024, 7:51 PM

## 2024-09-14 ENCOUNTER — Inpatient Hospital Stay (HOSPITAL_COMMUNITY)
Admit: 2024-09-14 | Discharge: 2024-09-14 | Disposition: A | Discharge status: Home / Self Care | Attending: Internal Medicine | Admitting: Internal Medicine

## 2024-09-14 ENCOUNTER — Inpatient Hospital Stay

## 2024-09-14 DIAGNOSIS — R296 Repeated falls: Secondary | ICD-10-CM | POA: Diagnosis not present

## 2024-09-14 DIAGNOSIS — Z79899 Other long term (current) drug therapy: Secondary | ICD-10-CM

## 2024-09-14 DIAGNOSIS — I2609 Other pulmonary embolism with acute cor pulmonale: Secondary | ICD-10-CM

## 2024-09-14 DIAGNOSIS — I2699 Other pulmonary embolism without acute cor pulmonale: Secondary | ICD-10-CM

## 2024-09-14 DIAGNOSIS — Z9889 Other specified postprocedural states: Secondary | ICD-10-CM

## 2024-09-14 LAB — HEPARIN LEVEL (UNFRACTIONATED)
Heparin Unfractionated: 0.11 [IU]/mL — ABNORMAL LOW (ref 0.30–0.70)
Heparin Unfractionated: 0.93 [IU]/mL — ABNORMAL HIGH (ref 0.30–0.70)
Heparin Unfractionated: 0.63 [IU]/mL (ref 0.30–0.70)

## 2024-09-14 LAB — MAGNESIUM: Magnesium: 2.1 mg/dL (ref 1.7–2.4)

## 2024-09-14 LAB — BASIC METABOLIC PANEL WITH GFR
Anion gap: 8 (ref 5–15)
BUN: 23 mg/dL — ABNORMAL HIGH (ref 6–20)
CO2: 25 mmol/L (ref 22–32)
Calcium: 8.2 mg/dL — ABNORMAL LOW (ref 8.9–10.3)
Chloride: 106 mmol/L (ref 98–111)
Creatinine, Ser: 1.54 mg/dL — ABNORMAL HIGH (ref 0.61–1.24)
GFR, Estimated: 52 mL/min — ABNORMAL LOW (ref >60)
Glucose, Bld: 110 mg/dL — ABNORMAL HIGH (ref 70–99)
Potassium: 3.8 mmol/L (ref 3.5–5.1)
Sodium: 139 mmol/L (ref 135–145)

## 2024-09-14 LAB — CBC
HCT: 37.7 % — ABNORMAL LOW (ref 39.0–52.0)
Hemoglobin: 12.5 g/dL — ABNORMAL LOW (ref 13.0–17.0)
MCH: 28.3 pg (ref 26.0–34.0)
MCHC: 33.2 g/dL (ref 30.0–36.0)
MCV: 85.5 fL (ref 80.0–100.0)
Platelets: 126 K/uL — ABNORMAL LOW (ref 150–400)
RBC: 4.41 MIL/uL (ref 4.22–5.81)
RDW: 13.5 % (ref 11.5–15.5)
WBC: 9.9 K/uL (ref 4.0–10.5)
nRBC: 0 % (ref 0.0–0.2)

## 2024-09-14 LAB — ECHOCARDIOGRAM COMPLETE
Area-P 1/2: 6.96 cm2
Height: 74.02 in
S' Lateral: 2.5 cm
Weight: 5015.91 [oz_av]

## 2024-09-14 LAB — MRSA NEXT GEN BY PCR, NASAL: MRSA by PCR Next Gen: NOT DETECTED

## 2024-09-14 LAB — GLUCOSE, CAPILLARY: Glucose-Capillary: 134 mg/dL — ABNORMAL HIGH (ref 70–99)

## 2024-09-14 LAB — LACTIC ACID, PLASMA: Lactic Acid, Venous: 0.8 mmol/L (ref 0.5–1.9)

## 2024-09-14 MED ORDER — HEPARIN (PORCINE) 25000 UT/250ML-% IV SOLN
2100.0000 [IU]/h | INTRAVENOUS | Status: DC
  Administered 2024-09-14: 2100 [IU]/h via INTRAVENOUS
  Administered 2024-09-14: 1850 [IU]/h via INTRAVENOUS
  Filled 2024-09-14 (×2): qty 250

## 2024-09-14 MED ORDER — CHLORHEXIDINE GLUCONATE CLOTH 2 % EX PADS
6.0000 | MEDICATED_PAD | Freq: Every day | CUTANEOUS | Status: DC
  Administered 2024-09-14 – 2024-09-21 (×6): 6 via TOPICAL

## 2024-09-14 MED ORDER — HEPARIN BOLUS VIA INFUSION
1700.0000 [IU] | Freq: Once | INTRAVENOUS | Status: AC
  Administered 2024-09-14: 1700 [IU] via INTRAVENOUS
  Filled 2024-09-14: qty 1700

## 2024-09-14 MED ORDER — HEPARIN BOLUS VIA INFUSION
5000.0000 [IU] | Freq: Once | INTRAVENOUS | Status: AC
  Administered 2024-09-14: 5000 [IU] via INTRAVENOUS
  Filled 2024-09-14: qty 5000

## 2024-09-14 MED ORDER — ACETAMINOPHEN 500 MG PO TABS
1000.0000 mg | ORAL_TABLET | Freq: Three times a day (TID) | ORAL | Status: DC | PRN
  Administered 2024-09-21: 1000 mg via ORAL
  Filled 2024-09-14: qty 2

## 2024-09-14 MED ORDER — GUAIFENESIN ER 600 MG PO TB12
1200.0000 mg | ORAL_TABLET | Freq: Two times a day (BID) | ORAL | Status: DC
  Administered 2024-09-14 – 2024-09-24 (×20): 1200 mg via ORAL
  Filled 2024-09-14 (×20): qty 2

## 2024-09-14 MED ORDER — HEPARIN (PORCINE) 25000 UT/250ML-% IV SOLN
1600.0000 [IU]/h | INTRAVENOUS | Status: DC
  Administered 2024-09-14: 1800 [IU]/h via INTRAVENOUS
  Administered 2024-09-16 – 2024-09-18 (×4): 1600 [IU]/h via INTRAVENOUS
  Filled 2024-09-14 (×6): qty 250

## 2024-09-14 MED ORDER — PERFLUTREN LIPID MICROSPHERE
1.0000 mL | INTRAVENOUS | Status: AC | PRN
  Administered 2024-09-14: 6 mL via INTRAVENOUS

## 2024-09-14 NOTE — Progress Notes (Signed)
 PROGRESS NOTE    Edwin [name]  FMW:[id number] DOB: 23-May-1966 DOA: 09/08/2024 PCP: Liana Fish, NP  IC08A/[id number]-AA  LOS: 6 days   Brief hospital course:   Assessment & Plan: 58 year old male with history of GERD, depression, anxiety, hyperlipidemia, morbid obesity, who presents ED for chief concerns of falling episodes at group home.    Severe cervical and thoracic myeloradiculopathy  S/p C3-4 anterior cervical discectomy and fusion and T2-T3 right sided transpedicular decompression with laminectomy, T1-T4 posterior spinal instrumentation and fusion on 09/12/24 --drain management --pain management --monitor bleeding and development of anterior cervical clot  --transfer to stepdown for close monitoring and early intubation with ICU team if pt is noted to have any developing anterior cervical clot.  Acute PE and DVT --dx 1 day after spine surgery.  Significant clot burden.  Echo could not visualize right heart.  After discussing benefits and risk of anticoagulation with Dr. Clois and Dr. Tamea, decision was to continue full anticoagulation due to significant risk of worsening into saddle embolus if anticoagulation is held.   --Echo couldn't visualize right heart --Vascular surgery consulted, no plan for thrombectomy or IVC filter at this time. --cont heparin gtt  Obesity, Class III, BMI 40-49.9 (morbid obesity) (HCC) This complicates overall care and prognosis.    Falling episodes Abnormality of gait Suspect secondary to spinal stenosis Patient is not endorsing pain --definitive tx for spinal stenosis --hold PT/OT for now   Schizoaffective disorder (HCC) Home risperidone 3 mg nightly, Thioridazine 100 mg p.o. twice daily, escitalopram 10 mg daily  --cont home regimen   Hyperlipidemia --cont statin   Type 2 diabetes mellitus with stage 3a chronic kidney disease, without long-term current use of insulin (HCC) --no need for BG checks   Stage 3b chronic kidney  disease (HCC) At baseline   Sleep apnea in adult CPAP nightly ordered   Delusion (HCC) With autism  Allergy --cont claritin and eye drops, per pt and mother request   DVT prophylaxis: SCD/Compression stockings Code Status: Full code  Family Communication: sister updated at bedside today Level of care: Stepdown Dispo:   The patient is from: group home Anticipated d/c is to: to be determined Anticipated d/c date is: > 3 days   Subjective and Interval History:  Overnight, pt was found to be tachycardic and having dyspnea.  CTA chest found numerous PE.  Pt was started on heparin gtt.  Given pt just had spine surgery, anticoagulation puts him at significant risk of post-operative clot (from bleeding from the anterior cervical spine where drain does not reach).  However, given the significant clot burden and high risk of worsening into saddle embolism, Dr. Tamea rec continuing with full anticoagulation.    Pt was transferred to stepdown for close monitoring and early intubation with ICU team if pt is noted to have any developing anterior cervical clot.  Vascular surgery consulted and did not rec thrombectomy or IVC filter at this point.   Objective: Vitals:   09/14/24 1600 09/14/24 1700 09/14/24 1800 09/14/24 1900  BP: (!) 121/92 (!) 123/93 (!) 145/80 (!) 131/94  Pulse: (!) 116 (!) 116 (!) 113 (!) 117  Resp: 14 20 16 16   Temp: 98.5 F (36.9 C)     TempSrc: Oral     SpO2: 96% 93% 92% 93%  Weight:      Height:        Intake/Output Summary (Last 24 hours) at 09/14/2024 1943 Last data filed at 09/14/2024 1643 Gross per 24 hour  Intake  264.09 ml  Output 750 ml  Net -485.91 ml   Filed Weights   09/09/24 0336 09/12/24 0835 09/14/24 1350  Weight: (!) 142.2 kg (!) 142.2 kg (!) 142.2 kg    Examination:   Constitutional: NAD, alert, oriented HEENT: conjunctivae and lids normal, EOMI CV: No cyanosis.   RESP: normal respiratory effort   Data Reviewed: I have  personally reviewed labs and imaging studies  Time spent: 60 minutes, including critical care time.  Ellouise Haber, MD [facility] If 7PM-7AM, please contact night-coverage 09/14/2024, 7:43 PM

## 2024-09-14 NOTE — Consult Note (Addendum)
 VASCULAR SURGERY CONSULTATION   Requested by:  Dedra Sanders, MD  Reason for consultation: PE    History of Present Illness   Edwin [name] is a 58 y.o. (09-30-1966) male who presents with cc: pulmonary embolus.  Pt underwent on 09/12/24: C3-4 diskectomy and fusion, T2 Laminectomy, T3 Transpedicular decompression, T1-T4 Posterior arthrodesis with allograft and autograft.  Pt was diagnosed with PE early 09/14/24.  Pt reported was breathing hard earlier.    Pt complains of some difficulty breathing due to productive sputum.  Pt is not complaining of pain with breathing.  Pt is not oxygen currently.   Past Medical History:  Diagnosis Date   Diabetes mellitus without complication (HCC)    Hypercholesteremia    Medical history non-contributory     Past Surgical History:  Procedure Laterality Date   ANTERIOR CERVICAL DECOMP/DISCECTOMY FUSION N/A 09/12/2024   Procedure: C3-4 anterior cervical discectomy and fusion;  Surgeon: Claudene Penne ORN, MD;  Location: [facility] ORS;  Service: Neurosurgery;  Laterality: N/A;   APPLICATION OF INTRAOPERATIVE CT SCAN N/A 09/12/2024   Procedure: APPLICATION OF INTRAOPERATIVE CT SCAN;  Surgeon: Claudene Penne ORN, MD;  Location: [facility] ORS;  Service: Neurosurgery;  Laterality: N/A;   BIOPSY  11/23/2023   Procedure: BIOPSY;  Surgeon: Therisa Bi, MD;  Location: [facility] ENDOSCOPY;  Service: Gastroenterology;;   COLONOSCOPY N/A 04/17/2024   Procedure: COLONOSCOPY;  Surgeon: Therisa Bi, MD;  Location: [facility] ENDOSCOPY;  Service: Gastroenterology;  Laterality: N/A;   COLONOSCOPY WITH PROPOFOL  N/A 11/23/2023   Procedure: COLONOSCOPY WITH PROPOFOL ;  Surgeon: Therisa Bi, MD;  Location: [facility] ENDOSCOPY;  Service: Gastroenterology;  Laterality: N/A;   ESOPHAGOGASTRODUODENOSCOPY (EGD) WITH PROPOFOL      ESOPHAGOGASTRODUODENOSCOPY (EGD) WITH PROPOFOL  N/A 11/23/2023   Procedure: ESOPHAGOGASTRODUODENOSCOPY (EGD) WITH PROPOFOL ;  Surgeon: Therisa Bi, MD;  Location: [facility]  ENDOSCOPY;  Service: Gastroenterology;  Laterality: N/A;   POLYPECTOMY  11/23/2023   Procedure: POLYPECTOMY;  Surgeon: Therisa Bi, MD;  Location: [facility] ENDOSCOPY;  Service: Gastroenterology;;   POLYPECTOMY  04/17/2024   Procedure: POLYPECTOMY, INTESTINE;  Surgeon: Therisa Bi, MD;  Location: [facility] ENDOSCOPY;  Service: Gastroenterology;;     Social History   Socioeconomic History   Marital status: Single    Spouse name: Not on file   Number of children: 0   Years of education: Not on file   Highest education level: Not on file  Occupational History   Occupation: Disabled  Tobacco Use   Smoking status: Never   Smokeless tobacco: Never  Vaping Use   Vaping status: Never Used  Substance and Sexual Activity   Alcohol use: No   Drug use: No   Sexual activity: Not Currently  Other Topics Concern   Not on file  Social History Narrative   Not on file   Social Drivers of Health   Financial Resource Strain: Low Risk  (05/08/2024)   Received from [facility]   Overall Financial Resource Strain (CARDIA)    Difficulty of Paying Living Expenses: Not hard at all  Food Insecurity: No Food Insecurity (09/09/2024)   Hunger Vital Sign    Worried About Running Out of Food in the Last Year: Never true    Ran Out of Food in the Last Year: Never true  Transportation Needs: No Transportation Needs (09/09/2024)   PRAPARE - Administrator, Civil Service (Medical): No    Lack of Transportation (Non-Medical): No  Physical Activity: Insufficiently Active (12/01/2021)   Exercise Vital Sign  Days of Exercise per Week: 3 days    Minutes of Exercise per Session: 10 min  Stress: No Stress Concern Present (12/01/2021)   [employer] of Occupational Health - Occupational Stress Questionnaire    Feeling of Stress : Not at all  Social Connections: Moderately Integrated (12/01/2021)   Social Connection and Isolation Panel    Frequency of Communication with Friends and  Family: More than three times a week    Frequency of Social Gatherings with Friends and Family: Once a week    Attends Religious Services: 1 to 4 times per year    Active Member of [employer] or Organizations: Yes    Attends Engineer, structural: More than 4 times per year    Marital Status: Never married  Intimate Partner Violence: Not At Risk (09/09/2024)   Humiliation, Afraid, Rape, and Kick questionnaire    Fear of Current or Ex-Partner: No    Emotionally Abused: No    Physically Abused: No    Sexually Abused: No    Family History  Problem Relation Age of Onset   Diabetes Father    Hypertension Father    Hypertension Sister    Diabetes Sister     Current Facility-Administered Medications  Medication Dose Route Frequency Provider Last Rate Last Admin   amantadine  (SYMMETREL ) capsule 100 mg  100 mg Oral BID Cox, Amy N, DO   100 mg at 09/14/24 1043   artificial tears ophthalmic solution 1 drop  1 drop Both Eyes BID Awanda City, MD   1 drop at 09/14/24 0816   bisacodyl (DULCOLAX) suppository 10 mg  10 mg Rectal Daily PRN Sreenath, Sudheer B, MD   10 mg at 09/10/24 1211   clonazePAM (KLONOPIN) tablet 0.5 mg  0.5 mg Oral QHS PRN Cox, Amy N, DO   0.5 mg at 09/09/24 0131   docusate sodium  (COLACE) capsule 100 mg  100 mg Oral TID Cox, Amy N, DO   100 mg at 09/14/24 0813   escitalopram (LEXAPRO) tablet 10 mg  10 mg Oral Daily Cox, Amy N, DO   10 mg at 09/14/24 0813   heparin ADULT infusion 100 units/mL (25000 units/250mL)  2,100 Units/hr Intravenous Continuous Tobie Cathaleen RAMAN, RPH 21 mL/hr at 09/14/24 0917 2,100 Units/hr at 09/14/24 0917   loratadine (CLARITIN) tablet 10 mg  10 mg Oral Daily Awanda City, MD   10 mg at 09/14/24 0814   morphine (PF) 2 MG/ML injection 2 mg  2 mg Intravenous Q4H PRN Awanda City, MD   2 mg at 09/13/24 2032   oxyCODONE (Oxy IR/ROXICODONE) immediate release tablet 5-10 mg  5-10 mg Oral Q4H PRN Awanda City, MD   10 mg at 09/14/24 0814   pantoprazole (PROTONIX) EC  tablet 40 mg  40 mg Oral Daily Cox, Amy N, DO   40 mg at 09/14/24 0813   phenol (CHLORASEPTIC) mouth spray 1 spray  1 spray Mouth/Throat PRN Awanda City, MD   1 spray at 09/14/24 0029   risperiDONE (RISPERDAL) tablet 3 mg  3 mg Oral QHS Cox, Amy N, DO   3 mg at 09/13/24 2031   senna-docusate (Senokot-S) tablet 1 tablet  1 tablet Oral BID Sreenath, Sudheer B, MD   1 tablet at 09/14/24 9185   simvastatin  (ZOCOR ) tablet 20 mg  20 mg Oral Daily Cox, Amy N, DO   20 mg at 09/14/24 9185   tamsulosin  (FLOMAX ) capsule 0.4 mg  0.4 mg Oral Daily Cox, Amy N, DO  0.4 mg at 09/14/24 0813   thioridazine (MELLARIL) tablet 100 mg  100 mg Oral BID [place], Howard F, RPH   100 mg at 09/14/24 1043    Allergies  Allergen Reactions   Citrullus Vulgaris Hives    REVIEW OF SYSTEMS (negative unless checked):   Cardiac:  [x]  Chest pain or chest pressure? [x]  Shortness of breath upon activity? [x]  Shortness of breath when lying flat? []  Irregular heart rhythm?  Vascular:  []  Pain in calf, thigh, or hip brought on by walking? []  Pain in feet at night that wakes you up from your sleep? []  Blood clot in your veins? []  Leg swelling?  Pulmonary:  []  Oxygen at home? []  Productive cough? []  Wheezing?  Neurologic:  []  Sudden weakness in arms or legs? []  Sudden numbness in arms or legs? []  Sudden onset of difficult speaking or slurred speech? []  Temporary loss of vision in one eye? []  Problems with dizziness?  Gastrointestinal:  []  Blood in stool? []  Vomited blood?  Genitourinary:  []  Burning when urinating? []  Blood in urine?  Psychiatric:  []  Major depression  Hematologic:  []  Bleeding problems? []  Problems with blood clotting?  Dermatologic:  []  Rashes or ulcers?  Constitutional:  []  Fever or chills?  Ear/Nose/Throat:  []  Change in hearing? []  Nose bleeds? []  Sore throat?  Musculoskeletal:  []  Back pain? []  Joint pain? []  Muscle pain?   Physical Examination     Vitals:    09/13/24 1855 09/13/24 2237 09/14/24 0419 09/14/24 0719  BP: (!) 142/74 131/73 (!) 142/77 118/78  Pulse: (!) 119 (!) 124 (!) 117 (!) 115  Resp: 20 20 16 16   Temp: 98.3 F (36.8 C) 97.9 F (36.6 C) 98 F (36.7 C) 98.7 F (37.1 C)  TempSrc:  Oral Oral   SpO2: 94% 94% 93% 93%  Weight:      Height:       Body mass index is 40.23 kg/m.  General Somulent, WD, NAD  Head [place]/AT,   Neck Supple, mid-line trachea,   Pulmonary Sym exp, good B air movt, suboptimal inspiration, no rales or rhonchi  Cardiac tachycardiac, no Murmurs, No rubs, No S3,S4  Vascular BLE: no swelling in either leg, SCD in palce    Laboratory   CBC    Latest Ref Rng & Units 09/14/2024    2:54 AM 09/13/2024    9:14 PM 09/12/2024    4:25 AM  CBC  WBC 4.0 - 10.5 K/uL 9.9  10.0  5.4   Hemoglobin 13.0 - 17.0 g/dL 87.4  87.3  85.2   Hematocrit 39.0 - 52.0 % 37.7  38.7  45.0   Platelets 150 - 400 K/uL 126  132  153     BMP    Latest Ref Rng & Units 09/14/2024    2:54 AM 09/13/2024    9:14 PM 09/12/2024    4:25 AM  BMP  Glucose 70 - 99 mg/dL 889  872  97   BUN 6 - 20 mg/dL 23  25  23    Creatinine 0.61 - 1.24 mg/dL 8.45  8.40  8.07   Sodium 135 - 145 mmol/L 139  138  141   Potassium 3.5 - 5.1 mmol/L 3.8  3.9  3.9   Chloride 98 - 111 mmol/L 106  104  109   CO2 22 - 32 mmol/L 25  23  27    Calcium 8.9 - 10.3 mg/dL 8.2  8.3  8.7     Lipids  Component Value Date/Time   CHOL 150 04/19/2024 0713   TRIG 87 04/19/2024 0713   HDL 43 04/19/2024 0713   CHOLHDL 3.5 04/19/2024 0713   LDLCALC 90 04/19/2024 0713     Radiology     CT Angio Chest Pulmonary Embolism (PE) W or WO Contrast Result Date: 09/14/2024 CLINICAL DATA:  Pulmonary embolism (PE) suspected, low to intermediate prob, positive D-dimer EXAM: CT ANGIOGRAPHY CHEST WITH CONTRAST TECHNIQUE: Multidetector CT imaging of the chest was performed using the standard protocol during bolus administration of intravenous contrast. Multiplanar CT image  reconstructions and MIPs were obtained to evaluate the vascular anatomy. RADIATION DOSE REDUCTION: This exam was performed according to the departmental dose-optimization program which includes automated exposure control, adjustment of the mA and/or kV according to patient size and/or use of iterative reconstruction technique. CONTRAST:  100mL OMNIPAQUE IOHEXOL 350 MG/ML SOLN COMPARISON:  None Available. FINDINGS: Cardiovascular: Heart is normal size. Aorta is normal caliber. Numerous filling defects in the pulmonary arteries bilaterally involving all lobes of both lungs compatible with pulmonary emboli. No evidence of right heart strain. Mediastinum/Nodes: No mediastinal, hilar, or axillary adenopathy. Trachea and esophagus are unremarkable. Thyroid unremarkable. Lungs/Pleura: In bibasilar airspace opacities could reflect atelectasis or early infarcts. No effusions. Upper Abdomen: No acute findings Musculoskeletal: Chest wall soft tissues are unremarkable. No acute bony abnormality. Postoperative changes in the upper thoracic spine. Review of the MIP images confirms the above findings. IMPRESSION: Numerous bilateral pulmonary emboli involving all lobes of both lungs. No evidence of right heart strain. Bibasilar atelectasis or early infarcts. These results will be called to the ordering clinician or representative by the Radiologist Assistant, and communication documented in the PACS or [employer]. Electronically Signed   By: Franky Crease M.D.   On: 09/14/2024 00:19   DG Thoracic Spine 2 View Result Date: 09/12/2024 CLINICAL DATA:  Elective surgery. EXAM: THORACIC SPINE 2 VIEWS COMPARISON:  None Available. FINDINGS: Seven fluoroscopic spot views of the thoracic spine submitted from the operating room as well as 3D C-arm images. Pedicle screws T1 through T4. Fluoroscopy time for both cervical and thoracic surgery 1 minute 51 seconds, dose 290.23 mGy. IMPRESSION: Intraoperative fluoroscopy during thoracic  spine surgery. Electronically Signed   By: Andrea Gasman M.D.   On: 09/12/2024 16:19   DG Cervical Spine 2-3 Views Result Date: 09/12/2024 CLINICAL DATA:  Elective surgery. EXAM: CERVICAL SPINE - 2-3 VIEW COMPARISON:  09/04/2024 FINDINGS: Two fluoroscopic spot views of the cervical spine submitted from the operating room. Anterior fusion hardware at C3-C4. Fluoroscopy time for both cervical and thoracic surgery 1 minute 51 seconds, dose 290.23 mGy. IMPRESSION: Intraoperative fluoroscopy during cervical spine surgery. Electronically Signed   By: Andrea Gasman M.D.   On: 09/12/2024 16:18   DG C-Arm 1-60 Min-No Report Result Date: 09/12/2024 Fluoroscopy was utilized by the requesting physician.  No radiographic interpretation.   DG C-Arm 1-60 Min-No Report Result Date: 09/12/2024 Fluoroscopy was utilized by the requesting physician.  No radiographic interpretation.   DG C-Arm 1-60 Min-No Report Result Date: 09/12/2024 Fluoroscopy was utilized by the requesting physician.  No radiographic interpretation.   DG C-Arm 1-60 Min-No Report Result Date: 09/12/2024 Fluoroscopy was utilized by the requesting physician.  No radiographic interpretation.   DG C-Arm 1-60 Min-No Report Result Date: 09/12/2024 Fluoroscopy was utilized by the requesting physician.  No radiographic interpretation.   I reviewed the CTA Chest: - scattered embolus in both lungs without obvious obvious of first order pulmonary arteries   Medical  Decision Making   Edwin [name] is a 58 y.o. male who presents s/p recent spinal surgery and PE  On examination, no evidence of pulmonary decompensation as still maintaining oxygen saturation on room air CTA does not demonstrate right heart strain Hemodynamics and oxygen saturation stable There is NO LEVEL ONE evidence demonstrating superiority of percutaneous thrombectomy of pulmonary emboli over anticoagulation Quick review of the current literature reveals, pt has NONE of  the markers for high risk pulmonary embolism, so more aggressive intervention is not necessary at this point. Even with pulmonary arterial thrombectomy, anticoagulation is necessary as impossible to suction out all of the thrombus Manipulating acute thrombus can cause fragmentation of thrombus allowing embolus distally into the pulmonary tree Distal thrombus is managed by autolysis while on anticoagulation Will run past Dr. Dreama who is covering for pulmonary thrombectomy this weekend He agrees that nothing would necessitate immediate intervention Will get BLE venous duplex to look for any residual thrombus is legs Thank you for allowing us  to participate in this patient's care.   Redell Door, MD, FACS, FSVS Covering for [facility]: [phone]  09/14/2024, 12:14 PM  Addendum Pt has a calf DVT.  CHEST guidelines for calf DVT are based on Dr. Greggory expert opinion article.  The trials he used to write his article actually said you bled more on anticoagulation for calf DVT and did not get necessarily a benefit, so I'm not certain why he wrote calf DVT should be treated with anticoagulation.  - Regardless, I would not put a IVC filter for calf DVT as the volume of a calf vein is minor.

## 2024-09-14 NOTE — Progress Notes (Signed)
 Inpatient Rehab Admissions Coordinator:     I spoke with Pt.'s sister who is his decision maker. She states that Pt. Lives in a group home for high functioning adults with disabilities. She states that in order to return, he needs to be able to ambulate and complete ADLs independently. I do not think a short stay on CIR would get him to that level, so I am recommending d/c to SNF instead.  She is in agreement with this plan. CIR will sign off.   Leita Kleine, MS, CCC-SLP Rehab Admissions Coordinator  [phone] (celll) [phone] (office)

## 2024-09-14 NOTE — Progress Notes (Signed)
 PHARMACY - ANTICOAGULATION CONSULT NOTE  Pharmacy Consult for Heparin  Indication: pulmonary embolus  Allergies  Allergen Reactions   Citrullus Vulgaris Hives    Patient Measurements: Height: 6' 2 (188 cm) Weight: (!) 142.2 kg (313 lb 7.9 oz) IBW/kg (Calculated) : 82.2 HEPARIN DW (KG): 114.6  Vital Signs: Temp: 100 F (37.8 C) (10/11 1945) Temp Source: Axillary (10/11 1945) BP: 125/92 (10/11 1945) Pulse Rate: 117 (10/11 1945)  Labs: Recent Labs    09/12/24 0425 09/13/24 2114 09/14/24 0254 09/14/24 0733 09/14/24 1435 09/14/24 2300  HGB 14.7 12.6* 12.5*  --   --   --   HCT 45.0 38.7* 37.7*  --   --   --   PLT 153 132* 126*  --   --   --   HEPARINUNFRC  --   --   --  0.11* 0.93* 0.63  CREATININE 1.92* 1.59* 1.54*  --   --   --     Estimated Creatinine Clearance: 77.6 mL/min (A) (by C-G formula based on SCr of 1.54 mg/dL (H)).   Medical History: Past Medical History:  Diagnosis Date   Diabetes mellitus without complication (HCC)    Hypercholesteremia    Medical history non-contributory     Medications:  Medications Prior to Admission  Medication Sig Dispense Refill Last Dose/Taking   acetaminophen  (TYLENOL ) 500 MG tablet Take 1,000 mg by mouth 2 (two) times daily as needed for moderate pain (pain score 4-6).   Unknown   Amantadine  HCl 100 MG tablet Take 1 tablet (100 mg total) by mouth 2 (two) times daily. 60 tablet 11 09/08/2024 at  8:00 AM   clonazePAM (KLONOPIN) 0.5 MG tablet Take 0.5 mg by mouth at bedtime as needed for anxiety.   Unknown   docusate sodium  (COLACE) 100 MG capsule Take 1 capsule (100 mg total) by mouth 3 (three) times daily. 90 capsule 11 09/08/2024 at  8:00 AM   empagliflozin  (JARDIANCE ) 10 MG TABS tablet Take 1 tablet (10 mg total) by mouth daily. 30 tablet 11 09/08/2024 at  8:00 AM   escitalopram (LEXAPRO) 10 MG tablet Take 10 mg by mouth daily.   09/08/2024 at  8:00 AM   HYDROcodone -acetaminophen  (NORCO/VICODIN) 5-325 MG tablet Take 1 tablet  by mouth 3 (three) times daily as needed for moderate pain (pain score 4-6) or severe pain (pain score 7-10). 90 tablet 0 09/08/2024 at  8:00 AM   meloxicam  (MOBIC ) 15 MG tablet TAKE 1 TABLET BY MOUTH ONCE DAILY FOR ARTHRITIS FLAIR/PAIN (TAKE WITH FOOD) AS NEEDED 30 tablet 5 09/07/2024   omeprazole  (PRILOSEC) 20 MG capsule TAKE (1) CAPSULE BY MOUTH ONCE DAILY. 30 capsule 6 09/08/2024 at  8:00 AM   risperiDONE (RISPERDAL) 3 MG tablet Take 3 mg by mouth at bedtime.   09/07/2024 at  8:00 PM   simvastatin  (ZOCOR ) 20 MG tablet Take 1 tablet (20 mg total) by mouth daily. 30 tablet 6 09/08/2024 at  8:00 AM   sodium fluoride (PREVIDENT) 1.1 % GEL dental gel Place 1 Application onto teeth at bedtime.   09/07/2024   tamsulosin  (FLOMAX ) 0.4 MG CAPS capsule Take 1 capsule (0.4 mg total) by mouth daily. 90 capsule 3 09/08/2024 at  8:00 AM   thioridazine (MELLARIL) 100 MG tablet TAKE 1 TABLET BY MOUTH TWICE DAILY 60 tablet 11 09/08/2024 at  8:00 AM    Assessment: Pharmacy consulted to dose heparin for PE in this 58 year old male.  No prior anticoag noted.  Pt had recent surgery  so MD requests using lower goal HL , will use 0.3 - 0.5.  10/11 0733 HL 0.11  10/11 1435 HL 0.93 10/11 2300 HL 0.63, elevated   Goal of Therapy:  Heparin level 0.3 - 0.5 units/ml Monitor platelets by anticoagulation protocol: Yes   Plan:   10/11:  HL @ 2300 = 0.63, elevated - Will decrease drip rate to 1600 units/hr and recheck HL 6 hrs after rate change - CBC daily   Violia Knopf D, PharmD 09/14/2024,11:38 PM

## 2024-09-14 NOTE — Plan of Care (Signed)
 Was asked by Dr. Ellouise Haber to render opinion as to whether anticoagulation was safe to be withheld in this postoperative spinal surgery patient with newly discovered bilateral pulmonary emboli.  I have reviewed the patient's chart, I have reviewed the CT angio chest performed yesterday.  Patient presented with cervical and thoracic myeloradiculopathy and underwent discectomy and fusion at C3-4, T2 laminectomy, T3 transpedicular decompression and T1-T4 arthrodesis with allograft and autograft on 12 September 2024.  He developed increased shortness of breath and tachycardia on 13 September 2024.  CT angio chest was then performed which showed bilateral PE.  As the patient is fresh postop the question was raised whether the the anticoagulation can be withheld.  After my independent review of the CT angio chest study I would not consider this prudent due to the extent of clot noted bilaterally.  Another clot could have devastating consequences.  I have discussed with Dr. Ellouise Haber and recommend that an echocardiogram be done to assess heart strain more accurately and continue anticoagulation.  If anticoagulation must be held because of postsurgical chance for bleeding it may be also prudent to confer with vascular surgery to see if thrombectomy/vena cava filter is a possibility in this case.  Recommend also transfer to higher level of care (i.e. stepdown/ICU) for close monitoring.   Edwin Leita Sanders, MD Advanced Bronchoscopy P[facility]   *This note was dictated using voice recognition software/Dragon.  Despite best efforts to proofread, errors can occur which can change the meaning. Any transcriptional errors that result from this process are unintentional and may not be fully corrected at the time of dictation.

## 2024-09-14 NOTE — Progress Notes (Signed)
 PHARMACY - ANTICOAGULATION CONSULT NOTE  Pharmacy Consult for Heparin  Indication: pulmonary embolus  Allergies  Allergen Reactions   Citrullus Vulgaris Hives    Patient Measurements: Height: 6' 2.02 (188 cm) Weight: (!) 142.2 kg (313 lb 7.9 oz) IBW/kg (Calculated) : 82.25 HEPARIN DW (KG): 114.6  Vital Signs: Temp: 98.7 F (37.1 C) (10/11 0719) Temp Source: Oral (10/11 0419) BP: 118/78 (10/11 0719) Pulse Rate: 115 (10/11 0719)  Labs: Recent Labs    09/12/24 0425 09/13/24 2114 09/14/24 0254 09/14/24 0733  HGB 14.7 12.6* 12.5*  --   HCT 45.0 38.7* 37.7*  --   PLT 153 132* 126*  --   HEPARINUNFRC  --   --   --  0.11*  CREATININE 1.92* 1.59* 1.54*  --     Estimated Creatinine Clearance: 77.6 mL/min (A) (by C-G formula based on SCr of 1.54 mg/dL (H)).   Medical History: Past Medical History:  Diagnosis Date   Diabetes mellitus without complication (HCC)    Hypercholesteremia    Medical history non-contributory     Medications:  Medications Prior to Admission  Medication Sig Dispense Refill Last Dose/Taking   acetaminophen  (TYLENOL ) 500 MG tablet Take 1,000 mg by mouth 2 (two) times daily as needed for moderate pain (pain score 4-6).   Unknown   Amantadine  HCl 100 MG tablet Take 1 tablet (100 mg total) by mouth 2 (two) times daily. 60 tablet 11 09/08/2024 at  8:00 AM   clonazePAM (KLONOPIN) 0.5 MG tablet Take 0.5 mg by mouth at bedtime as needed for anxiety.   Unknown   docusate sodium  (COLACE) 100 MG capsule Take 1 capsule (100 mg total) by mouth 3 (three) times daily. 90 capsule 11 09/08/2024 at  8:00 AM   empagliflozin  (JARDIANCE ) 10 MG TABS tablet Take 1 tablet (10 mg total) by mouth daily. 30 tablet 11 09/08/2024 at  8:00 AM   escitalopram (LEXAPRO) 10 MG tablet Take 10 mg by mouth daily.   09/08/2024 at  8:00 AM   HYDROcodone -acetaminophen  (NORCO/VICODIN) 5-325 MG tablet Take 1 tablet by mouth 3 (three) times daily as needed for moderate pain (pain score 4-6) or  severe pain (pain score 7-10). 90 tablet 0 09/08/2024 at  8:00 AM   meloxicam  (MOBIC ) 15 MG tablet TAKE 1 TABLET BY MOUTH ONCE DAILY FOR ARTHRITIS FLAIR/PAIN (TAKE WITH FOOD) AS NEEDED 30 tablet 5 09/07/2024   omeprazole  (PRILOSEC) 20 MG capsule TAKE (1) CAPSULE BY MOUTH ONCE DAILY. 30 capsule 6 09/08/2024 at  8:00 AM   risperiDONE (RISPERDAL) 3 MG tablet Take 3 mg by mouth at bedtime.   09/07/2024 at  8:00 PM   simvastatin  (ZOCOR ) 20 MG tablet Take 1 tablet (20 mg total) by mouth daily. 30 tablet 6 09/08/2024 at  8:00 AM   sodium fluoride (PREVIDENT) 1.1 % GEL dental gel Place 1 Application onto teeth at bedtime.   09/07/2024   tamsulosin  (FLOMAX ) 0.4 MG CAPS capsule Take 1 capsule (0.4 mg total) by mouth daily. 90 capsule 3 09/08/2024 at  8:00 AM   thioridazine (MELLARIL) 100 MG tablet TAKE 1 TABLET BY MOUTH TWICE DAILY 60 tablet 11 09/08/2024 at  8:00 AM    Assessment: Pharmacy consulted to dose heparin for PE in this 58 year old male.  No prior anticoag noted.  Pt had recent surgery so MD requests using lower goal HL , will use 0.3 - 0.5.  10/11 0733 HL 0.11   Goal of Therapy:  Heparin level 0.3 - 0.5 units/ml  Monitor platelets by anticoagulation protocol: Yes   Plan:   Heparin level is subtherapeutic. Will half bolus due to recent procedure of 1700 units x 1 and increase heparin infusion to 2100 units/hr. Recheck heparin level in 6 hours.   Cathaleen GORMAN Blanch, PharmD, BCPS 09/14/2024,8:23 AM

## 2024-09-14 NOTE — Progress Notes (Signed)
 PT Cancellation Note  Patient Details Name: Edwin [name] MRN: [id number] DOB: 1966/01/02   Cancelled Treatment:     PT attempt. Per chart review has PE. Will hold PT today per protocols but will continue to follow + progress as per current POC.    Rankin KATHEE Essex 09/14/2024, 8:37 AM

## 2024-09-14 NOTE — Progress Notes (Signed)
 Pt arrived to unit at this time. Pt is alert and can answer most questions appropriately. Pt is on room air and all VSS. HR 117. Pt's sister is present at bedside.

## 2024-09-14 NOTE — Progress Notes (Signed)
 Attending Progress Note  History: Edwin [name] is here for progressive failure to thrive.  He is 58 year old man with rapidly progressing cervical and thoracic myeloradiculopathy.  He is been having progressive weakness numbness and tingling his bilateral upper and lower extremities.  Has had progressive difficulty with ambulation.  Has went from being ambulatory independent to requiring a rollator.  He feels that his legs do not listen to him anymore.  He was scheduled for a anterior cervical discectomy and fusion and posterior thoracic decompression and fusion, however he became too difficult to manage at home and was admitted to the hospital this past weekend.  POD2: Dx with pulmonary emboli last night.  Started on anticoagulation due to extensive clot burden.  POD1: Had pain overnight  Physical Exam: Vitals:   09/14/24 0419 09/14/24 0719  BP: (!) 142/77 118/78  Pulse: (!) 117 (!) 115  Resp: 16 16  Temp: 98 F (36.7 C) 98.7 F (37.1 C)  SpO2: 93% 93%    AA O CNI  Strength:   He is more than antigravity throughout.  Slow but 5/5 BUE Poor effort BLE but greater than antigravity  Drain not well documented  Data:  Recent Labs  Lab 09/12/24 0425 09/13/24 2114 09/14/24 0254  NA 141 138 139  K 3.9 3.9 3.8  CL 109 104 106  CO2 27 23 25   BUN 23* 25* 23*  CREATININE 1.92* 1.59* 1.54*  GLUCOSE 97 127* 110*  CALCIUM 8.7* 8.3* 8.2*   Recent Labs  Lab 09/13/24 2114  AST 29  ALT 19  ALKPHOS 81     Recent Labs  Lab 09/12/24 0425 09/13/24 2114 09/14/24 0254  WBC 5.4 10.0 9.9  HGB 14.7 12.6* 12.5*  HCT 45.0 38.7* 37.7*  PLT 153 132* 126*   No results for input(s): APTT, INR in the last 168 hours.       Assessment/Plan:  Edwin [name] is a 58 year old gentleman with some developmental delay who was experiencing a progressive loss of ambulatory function and coordination in his upper extremities.  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    I have discussed his case with both Dr. Awanda and Dr. Tamea.  He is at substantial risk without anticoagulation due to his clot burden per Dr. Tamea, so requires anticoagulation.  This puts him at significant risk of a post-operative clot.  I have disclosed that to the patient's family.  - Continue drain - transfer to SD to monitor airway - discussed early intubation with ICU team if he is noted to have any developing anterior cervical clot  [name] MD Department of Neurosurgery

## 2024-09-14 NOTE — Progress Notes (Signed)
      Daily Progress Note  No events, drowsy   Objective   Vitals:   09/14/24 1350 09/14/24 1400 09/14/24 1500 09/14/24 1600  BP: 137/77 121/78 127/87 (!) 121/92  Pulse: (!) 117 (!) 118 (!) 116 (!) 116  Resp: 11 14 11 14   Temp: 99.3 F (37.4 C)   98.5 F (36.9 C)  TempSrc: Oral   Oral  SpO2: 97% 95% (!) 89% 96%  Weight: (!) 142.2 kg     Height: 6' 2 (1.88 m)        Intake/Output Summary (Last 24 hours) at 09/14/2024 1619 Last data filed at 09/14/2024 1600 Gross per 24 hour  Intake 220.53 ml  Output 1610 ml  Net -1389.47 ml    PULM: no tachypnea, no accessory muscle use, breathing comfortably CV: tachycardiac, no M/G  - stable hemodynamics with stable pulm function on RA  - no immediate indications for pulmonary thrombectomy   Redell Door, MD, FACS, FSVS Covering for [facility]: [phone]  09/14/2024, 4:19 PM

## 2024-09-14 NOTE — Progress Notes (Signed)
 PHARMACY - ANTICOAGULATION CONSULT NOTE  Pharmacy Consult for Heparin  Indication: pulmonary embolus  Allergies  Allergen Reactions   Citrullus Vulgaris Hives    Patient Measurements: Height: 6' 2 (188 cm) Weight: (!) 142.2 kg (313 lb 7.9 oz) IBW/kg (Calculated) : 82.2 HEPARIN DW (KG): 114.6  Vital Signs: Temp: 98.5 F (36.9 C) (10/11 1600) Temp Source: Oral (10/11 1600) BP: 127/87 (10/11 1500) Pulse Rate: 116 (10/11 1600)  Labs: Recent Labs    09/12/24 0425 09/13/24 2114 09/14/24 0254 09/14/24 0733 09/14/24 1435  HGB 14.7 12.6* 12.5*  --   --   HCT 45.0 38.7* 37.7*  --   --   PLT 153 132* 126*  --   --   HEPARINUNFRC  --   --   --  0.11* 0.93*  CREATININE 1.92* 1.59* 1.54*  --   --     Estimated Creatinine Clearance: 77.6 mL/min (A) (by C-G formula based on SCr of 1.54 mg/dL (H)).   Medical History: Past Medical History:  Diagnosis Date   Diabetes mellitus without complication (HCC)    Hypercholesteremia    Medical history non-contributory     Medications:  Medications Prior to Admission  Medication Sig Dispense Refill Last Dose/Taking   acetaminophen  (TYLENOL ) 500 MG tablet Take 1,000 mg by mouth 2 (two) times daily as needed for moderate pain (pain score 4-6).   Unknown   Amantadine  HCl 100 MG tablet Take 1 tablet (100 mg total) by mouth 2 (two) times daily. 60 tablet 11 09/08/2024 at  8:00 AM   clonazePAM (KLONOPIN) 0.5 MG tablet Take 0.5 mg by mouth at bedtime as needed for anxiety.   Unknown   docusate sodium  (COLACE) 100 MG capsule Take 1 capsule (100 mg total) by mouth 3 (three) times daily. 90 capsule 11 09/08/2024 at  8:00 AM   empagliflozin  (JARDIANCE ) 10 MG TABS tablet Take 1 tablet (10 mg total) by mouth daily. 30 tablet 11 09/08/2024 at  8:00 AM   escitalopram (LEXAPRO) 10 MG tablet Take 10 mg by mouth daily.   09/08/2024 at  8:00 AM   HYDROcodone -acetaminophen  (NORCO/VICODIN) 5-325 MG tablet Take 1 tablet by mouth 3 (three) times daily as needed  for moderate pain (pain score 4-6) or severe pain (pain score 7-10). 90 tablet 0 09/08/2024 at  8:00 AM   meloxicam  (MOBIC ) 15 MG tablet TAKE 1 TABLET BY MOUTH ONCE DAILY FOR ARTHRITIS FLAIR/PAIN (TAKE WITH FOOD) AS NEEDED 30 tablet 5 09/07/2024   omeprazole  (PRILOSEC) 20 MG capsule TAKE (1) CAPSULE BY MOUTH ONCE DAILY. 30 capsule 6 09/08/2024 at  8:00 AM   risperiDONE (RISPERDAL) 3 MG tablet Take 3 mg by mouth at bedtime.   09/07/2024 at  8:00 PM   simvastatin  (ZOCOR ) 20 MG tablet Take 1 tablet (20 mg total) by mouth daily. 30 tablet 6 09/08/2024 at  8:00 AM   sodium fluoride (PREVIDENT) 1.1 % GEL dental gel Place 1 Application onto teeth at bedtime.   09/07/2024   tamsulosin  (FLOMAX ) 0.4 MG CAPS capsule Take 1 capsule (0.4 mg total) by mouth daily. 90 capsule 3 09/08/2024 at  8:00 AM   thioridazine (MELLARIL) 100 MG tablet TAKE 1 TABLET BY MOUTH TWICE DAILY 60 tablet 11 09/08/2024 at  8:00 AM    Assessment: Pharmacy consulted to dose heparin for PE in this 58 year old male.  No prior anticoag noted.  Pt had recent surgery so MD requests using lower goal HL , will use 0.3 - 0.5.  10/11  0733 HL 0.11  10/11 1435 HL 0.93  Goal of Therapy:  Heparin level 0.3 - 0.5 units/ml Monitor platelets by anticoagulation protocol: Yes   Plan:   Heparin level is SUPRAtherapeutic. Will hold heparin infusion x 30 minutes then decrease heparin infusion to 1800 units/hr. Recheck heparin level in 6 hours. Monitor CBC daily while on heparin.  Lum VEAR Mania, PharmD, BCPS 09/14/2024,4:01 PM

## 2024-09-14 NOTE — Progress Notes (Signed)
 Echocardiogram 2D Echocardiogram has been performed. Definity IV ultrasound imaging agent used on this study.  Edwin [name] Louder 09/14/2024, 10:01 AM

## 2024-09-14 NOTE — Progress Notes (Signed)
 PHARMACY - ANTICOAGULATION CONSULT NOTE  Pharmacy Consult for Heparin  Indication: pulmonary embolus  Allergies  Allergen Reactions   Citrullus Vulgaris Hives    Patient Measurements: Height: 6' 2.02 (188 cm) Weight: (!) 142.2 kg (313 lb 7.9 oz) IBW/kg (Calculated) : 82.25 HEPARIN DW (KG): 114.6  Vital Signs: Temp: 97.9 F (36.6 C) (10/10 2237) Temp Source: Oral (10/10 2237) BP: 131/73 (10/10 2237) Pulse Rate: 124 (10/10 2237)  Labs: Recent Labs    09/12/24 0425 09/13/24 2114  HGB 14.7 12.6*  HCT 45.0 38.7*  PLT 153 132*  CREATININE 1.92* 1.59*    Estimated Creatinine Clearance: 75.1 mL/min (A) (by C-G formula based on SCr of 1.59 mg/dL (H)).   Medical History: Past Medical History:  Diagnosis Date   Diabetes mellitus without complication (HCC)    Hypercholesteremia    Medical history non-contributory     Medications:  Medications Prior to Admission  Medication Sig Dispense Refill Last Dose/Taking   acetaminophen  (TYLENOL ) 500 MG tablet Take 1,000 mg by mouth 2 (two) times daily as needed for moderate pain (pain score 4-6).   Unknown   Amantadine  HCl 100 MG tablet Take 1 tablet (100 mg total) by mouth 2 (two) times daily. 60 tablet 11 09/08/2024 at  8:00 AM   clonazePAM (KLONOPIN) 0.5 MG tablet Take 0.5 mg by mouth at bedtime as needed for anxiety.   Unknown   docusate sodium  (COLACE) 100 MG capsule Take 1 capsule (100 mg total) by mouth 3 (three) times daily. 90 capsule 11 09/08/2024 at  8:00 AM   empagliflozin  (JARDIANCE ) 10 MG TABS tablet Take 1 tablet (10 mg total) by mouth daily. 30 tablet 11 09/08/2024 at  8:00 AM   escitalopram (LEXAPRO) 10 MG tablet Take 10 mg by mouth daily.   09/08/2024 at  8:00 AM   HYDROcodone -acetaminophen  (NORCO/VICODIN) 5-325 MG tablet Take 1 tablet by mouth 3 (three) times daily as needed for moderate pain (pain score 4-6) or severe pain (pain score 7-10). 90 tablet 0 09/08/2024 at  8:00 AM   meloxicam  (MOBIC ) 15 MG tablet TAKE 1  TABLET BY MOUTH ONCE DAILY FOR ARTHRITIS FLAIR/PAIN (TAKE WITH FOOD) AS NEEDED 30 tablet 5 09/07/2024   omeprazole  (PRILOSEC) 20 MG capsule TAKE (1) CAPSULE BY MOUTH ONCE DAILY. 30 capsule 6 09/08/2024 at  8:00 AM   risperiDONE (RISPERDAL) 3 MG tablet Take 3 mg by mouth at bedtime.   09/07/2024 at  8:00 PM   simvastatin  (ZOCOR ) 20 MG tablet Take 1 tablet (20 mg total) by mouth daily. 30 tablet 6 09/08/2024 at  8:00 AM   sodium fluoride (PREVIDENT) 1.1 % GEL dental gel Place 1 Application onto teeth at bedtime.   09/07/2024   tamsulosin  (FLOMAX ) 0.4 MG CAPS capsule Take 1 capsule (0.4 mg total) by mouth daily. 90 capsule 3 09/08/2024 at  8:00 AM   thioridazine (MELLARIL) 100 MG tablet TAKE 1 TABLET BY MOUTH TWICE DAILY 60 tablet 11 09/08/2024 at  8:00 AM    Assessment: Pharmacy consulted to dose heparin for PE in this 58 year old male.  No prior anticoag noted.  Pt had recent surgery so MD requests using lower goal HL , will use 0.3 - 0.5.  CrCl = 75.1 ml/min   Goal of Therapy:  Heparin level 0.3 - 0.5 units/ml Monitor platelets by anticoagulation protocol: Yes   Plan:  GOAL HL :  0.3 - 0.5  Give 5000 units bolus x 1 Start heparin infusion at 1850 units/hr Check anti-Xa  level in 6 hours and daily while on heparin Continue to monitor H&H and platelets  Django Nguyen D 09/14/2024,2:20 AM

## 2024-09-15 DIAGNOSIS — R296 Repeated falls: Secondary | ICD-10-CM | POA: Diagnosis not present

## 2024-09-15 DIAGNOSIS — I82409 Acute embolism and thrombosis of unspecified deep veins of unspecified lower extremity: Secondary | ICD-10-CM

## 2024-09-15 LAB — CBC
HCT: 37.5 % — ABNORMAL LOW (ref 39.0–52.0)
Hemoglobin: 12.4 g/dL — ABNORMAL LOW (ref 13.0–17.0)
MCH: 28.1 pg (ref 26.0–34.0)
MCHC: 33.1 g/dL (ref 30.0–36.0)
MCV: 85 fL (ref 80.0–100.0)
Platelets: 136 K/uL — ABNORMAL LOW (ref 150–400)
RBC: 4.41 MIL/uL (ref 4.22–5.81)
RDW: 13.4 % (ref 11.5–15.5)
WBC: 11.4 K/uL — ABNORMAL HIGH (ref 4.0–10.5)
nRBC: 0 % (ref 0.0–0.2)

## 2024-09-15 LAB — HEPARIN LEVEL (UNFRACTIONATED)
Heparin Unfractionated: 0.59 [IU]/mL (ref 0.30–0.70)
Heparin Unfractionated: 0.4 [IU]/mL (ref 0.30–0.70)
Heparin Unfractionated: 0.37 [IU]/mL (ref 0.30–0.70)

## 2024-09-15 NOTE — Plan of Care (Signed)
  Problem: Education: Goal: Knowledge of General Education information will improve Description: Including pain rating scale, medication(s)/side effects and non-pharmacologic comfort measures Outcome: Progressing   Problem: Health Behavior/Discharge Planning: Goal: Ability to manage health-related needs will improve Outcome: Progressing   Problem: Clinical Measurements: Goal: Ability to maintain clinical measurements within normal limits will improve Outcome: Progressing   Problem: Coping: Goal: Level of anxiety will decrease Outcome: Progressing   Problem: Elimination: Goal: Will not experience complications related to bowel motility Outcome: Progressing   Problem: Pain Managment: Goal: General experience of comfort will improve and/or be controlled Outcome: Progressing   Problem: Safety: Goal: Ability to remain free from injury will improve Outcome: Progressing

## 2024-09-15 NOTE — Progress Notes (Signed)
 PHARMACY - ANTICOAGULATION CONSULT NOTE  Pharmacy Consult for Heparin  Indication: pulmonary embolus  Allergies  Allergen Reactions   Citrullus Vulgaris Hives    Patient Measurements: Height: 6' 2 (188 cm) Weight: (!) 142.2 kg (313 lb 7.9 oz) IBW/kg (Calculated) : 82.2 HEPARIN DW (KG): 114.6  Vital Signs: Temp: 98 F (36.7 C) (10/12 1230) Temp Source: Oral (10/12 1230) BP: 137/88 (10/12 1400) Pulse Rate: 120 (10/12 1400)  Labs: Recent Labs    09/13/24 2114 09/14/24 0254 09/14/24 0733 09/14/24 2300 09/15/24 0626 09/15/24 1358  HGB 12.6* 12.5*  --   --  12.4*  --   HCT 38.7* 37.7*  --   --  37.5*  --   PLT 132* 126*  --   --  136*  --   HEPARINUNFRC  --   --    < > 0.63 0.59 0.40  CREATININE 1.59* 1.54*  --   --   --   --    < > = values in this interval not displayed.    Estimated Creatinine Clearance: 77.6 mL/min (A) (by C-G formula based on SCr of 1.54 mg/dL (H)).   Medical History: Past Medical History:  Diagnosis Date   Diabetes mellitus without complication (HCC)    Hypercholesteremia    Medical history non-contributory     Medications:  Medications Prior to Admission  Medication Sig Dispense Refill Last Dose/Taking   acetaminophen  (TYLENOL ) 500 MG tablet Take 1,000 mg by mouth 2 (two) times daily as needed for moderate pain (pain score 4-6).   Unknown   Amantadine  HCl 100 MG tablet Take 1 tablet (100 mg total) by mouth 2 (two) times daily. 60 tablet 11 09/08/2024 at  8:00 AM   clonazePAM (KLONOPIN) 0.5 MG tablet Take 0.5 mg by mouth at bedtime as needed for anxiety.   Unknown   docusate sodium  (COLACE) 100 MG capsule Take 1 capsule (100 mg total) by mouth 3 (three) times daily. 90 capsule 11 09/08/2024 at  8:00 AM   empagliflozin  (JARDIANCE ) 10 MG TABS tablet Take 1 tablet (10 mg total) by mouth daily. 30 tablet 11 09/08/2024 at  8:00 AM   escitalopram (LEXAPRO) 10 MG tablet Take 10 mg by mouth daily.   09/08/2024 at  8:00 AM   HYDROcodone -acetaminophen   (NORCO/VICODIN) 5-325 MG tablet Take 1 tablet by mouth 3 (three) times daily as needed for moderate pain (pain score 4-6) or severe pain (pain score 7-10). 90 tablet 0 09/08/2024 at  8:00 AM   meloxicam  (MOBIC ) 15 MG tablet TAKE 1 TABLET BY MOUTH ONCE DAILY FOR ARTHRITIS FLAIR/PAIN (TAKE WITH FOOD) AS NEEDED 30 tablet 5 09/07/2024   omeprazole  (PRILOSEC) 20 MG capsule TAKE (1) CAPSULE BY MOUTH ONCE DAILY. 30 capsule 6 09/08/2024 at  8:00 AM   risperiDONE (RISPERDAL) 3 MG tablet Take 3 mg by mouth at bedtime.   09/07/2024 at  8:00 PM   simvastatin  (ZOCOR ) 20 MG tablet Take 1 tablet (20 mg total) by mouth daily. 30 tablet 6 09/08/2024 at  8:00 AM   sodium fluoride (PREVIDENT) 1.1 % GEL dental gel Place 1 Application onto teeth at bedtime.   09/07/2024   tamsulosin  (FLOMAX ) 0.4 MG CAPS capsule Take 1 capsule (0.4 mg total) by mouth daily. 90 capsule 3 09/08/2024 at  8:00 AM   thioridazine (MELLARIL) 100 MG tablet TAKE 1 TABLET BY MOUTH TWICE DAILY 60 tablet 11 09/08/2024 at  8:00 AM    Assessment: Pharmacy consulted to dose heparin for PE in this  58 year old male.  No prior anticoag noted.  Pt had recent surgery so MD requests using lower goal HL , will use 0.3 - 0.5.  10/11 0733 HL 0.11  10/11 1435 HL 0.93 10/11 2300 HL 0.63, elevated  10/12 0626 HL 0.59, elevated  10/12 1358 HL 0.4   Goal of Therapy:  Heparin level 0.3 - 0.5 units/ml Monitor platelets by anticoagulation protocol: Yes   Plan:  Heparin level is therapeutic. Will continue heparin infusion at 1500 units/hr. Recheck heparin level in 6 hours. CBC daily while on heparin.   Cathaleen GORMAN Blanch, PharmD. BCPS 09/15/2024,2:32 PM

## 2024-09-15 NOTE — Progress Notes (Signed)
  PROGRESS NOTE    JOSHUAL TERRIO  FMW:[id number] DOB: 09/04/1966 DOA: 09/08/2024 PCP: Liana Fish, NP  [id number]/[id number]  LOS: 7 days   Brief hospital course:   Assessment & Plan: 58 year old male with history of GERD, depression, anxiety, hyperlipidemia, morbid obesity, who presents ED for chief concerns of falling episodes at group home.    Severe cervical and thoracic myeloradiculopathy  S/p C3-4 anterior cervical discectomy and fusion and T2-T3 right sided transpedicular decompression with laminectomy, T1-T4 posterior spinal instrumentation and fusion on 09/12/24 --drain management --pain management --monitor for development of anterior cervical clot. --early intubation with ICU team if pt is noted to have any developing anterior cervical clot.  Acute PE and DVT --dx 1 day after spine surgery.  Significant clot burden.  Echo could not visualize right heart.  After discussing benefits and risk of anticoagulation with Dr. Clois and Dr. Tamea, decision was to continue full anticoagulation due to significant risk of worsening into saddle embolus if anticoagulation is held.   --Echo couldn't visualize right heart --Vascular surgery consulted, no plan for thrombectomy or IVC filter at this time. --cont heparin gtt  Obesity, Class III, BMI 40-49.9 (morbid obesity) (HCC) This complicates overall care and prognosis.    Falling episodes Abnormality of gait Suspect secondary to spinal stenosis Patient is not endorsing pain --definitive tx for spinal stenosis --PT/OT tomorrow   Schizoaffective disorder (HCC) Home risperidone 3 mg nightly, Thioridazine 100 mg p.o. twice daily, escitalopram 10 mg daily  --cont home regimen   Hyperlipidemia --cont statin   Type 2 diabetes mellitus with stage 3a chronic kidney disease, without long-term current use of insulin (HCC) --no need for BG checks   Stage 3b chronic kidney disease (HCC) At baseline   Sleep apnea in adult CPAP  nightly ordered   Delusion (HCC) With autism  Allergy --cont claritin and eye drops, per pt and mother request   DVT prophylaxis: SCD/Compression stockings Code Status: Full code  Family Communication: sister updated at bedside today Level of care: Stepdown Dispo:   The patient is from: group home Anticipated d/c is to: to be determined Anticipated d/c date is: > 3 days   Subjective and Interval History:  Pt reported some dyspnea.  Per sister, pt had throat discomfort which discouraged him from eating.   Objective: Vitals:   09/15/24 1330 09/15/24 1400 09/15/24 1430 09/15/24 1500  BP: (!) 125/92 137/88 (!) 124/90 (!) 132/92  Pulse: (!) 120 (!) 120 (!) 118 (!) 121  Resp: 17 14 15 17   Temp:      TempSrc:      SpO2: 94% 96% 92% 93%  Weight:      Height:        Intake/Output Summary (Last 24 hours) at 09/15/2024 1553 Last data filed at 09/15/2024 1500 Gross per 24 hour  Intake 368.97 ml  Output 900 ml  Net -531.03 ml   Filed Weights   09/09/24 0336 09/12/24 0835 09/14/24 1350  Weight: (!) 142.2 kg (!) 142.2 kg (!) 142.2 kg    Examination:   Constitutional: NAD, awake, oriented HEENT: conjunctivae and lids normal, EOMI CV: No cyanosis.   RESP: normal respiratory effort, on RA   Data Reviewed: I have personally reviewed labs and imaging studies  Time spent: 35 minutes   Ellouise Haber, MD [facility] If 7PM-7AM, please contact night-coverage 09/15/2024, 3:53 PM

## 2024-09-15 NOTE — H&P (View-Only) (Signed)
      Daily Progress Note   Vitals:   09/15/24 1500 09/15/24 1530 09/15/24 1600 09/15/24 1800  BP: (!) 132/92 (!) 129/92 (!) 139/90   Pulse: (!) 121 (!) 119 (!) 122   Resp: 17 16 17    Temp:   97.7 F (36.5 C) 98.2 F (36.8 C)  TempSrc:   Oral Oral  SpO2: 93% 94% 92%   Weight:      Height:       - hemodynamics and pulm saturation remains unchanged - Keep NPO in case Dr. Marea elects to offer pt pulm thrombectomy   Redell Door, MD, FACS, FSVS Covering for [facility]: [phone]  09/15/2024, 7:02 PM

## 2024-09-15 NOTE — Progress Notes (Signed)
 PHARMACY - ANTICOAGULATION CONSULT NOTE  Pharmacy Consult for Heparin  Indication: pulmonary embolus  Allergies  Allergen Reactions   Citrullus Vulgaris Hives    Patient Measurements: Height: 6' 2 (188 cm) Weight: (!) 142.2 kg (313 lb 7.9 oz) IBW/kg (Calculated) : 82.2 HEPARIN DW (KG): 114.6  Vital Signs: Temp: 99.7 F (37.6 C) (10/12 0400) Temp Source: Axillary (10/12 0400) BP: 129/95 (10/12 0400) Pulse Rate: 117 (10/12 0400)  Labs: Recent Labs    09/13/24 2114 09/14/24 0254 09/14/24 0733 09/14/24 1435 09/14/24 2300 09/15/24 0626  HGB 12.6* 12.5*  --   --   --  12.4*  HCT 38.7* 37.7*  --   --   --  37.5*  PLT 132* 126*  --   --   --  136*  HEPARINUNFRC  --   --    < > 0.93* 0.63 0.59  CREATININE 1.59* 1.54*  --   --   --   --    < > = values in this interval not displayed.    Estimated Creatinine Clearance: 77.6 mL/min (A) (by C-G formula based on SCr of 1.54 mg/dL (H)).   Medical History: Past Medical History:  Diagnosis Date   Diabetes mellitus without complication (HCC)    Hypercholesteremia    Medical history non-contributory     Medications:  Medications Prior to Admission  Medication Sig Dispense Refill Last Dose/Taking   acetaminophen  (TYLENOL ) 500 MG tablet Take 1,000 mg by mouth 2 (two) times daily as needed for moderate pain (pain score 4-6).   Unknown   Amantadine  HCl 100 MG tablet Take 1 tablet (100 mg total) by mouth 2 (two) times daily. 60 tablet 11 09/08/2024 at  8:00 AM   clonazePAM (KLONOPIN) 0.5 MG tablet Take 0.5 mg by mouth at bedtime as needed for anxiety.   Unknown   docusate sodium  (COLACE) 100 MG capsule Take 1 capsule (100 mg total) by mouth 3 (three) times daily. 90 capsule 11 09/08/2024 at  8:00 AM   empagliflozin  (JARDIANCE ) 10 MG TABS tablet Take 1 tablet (10 mg total) by mouth daily. 30 tablet 11 09/08/2024 at  8:00 AM   escitalopram (LEXAPRO) 10 MG tablet Take 10 mg by mouth daily.   09/08/2024 at  8:00 AM    HYDROcodone -acetaminophen  (NORCO/VICODIN) 5-325 MG tablet Take 1 tablet by mouth 3 (three) times daily as needed for moderate pain (pain score 4-6) or severe pain (pain score 7-10). 90 tablet 0 09/08/2024 at  8:00 AM   meloxicam  (MOBIC ) 15 MG tablet TAKE 1 TABLET BY MOUTH ONCE DAILY FOR ARTHRITIS FLAIR/PAIN (TAKE WITH FOOD) AS NEEDED 30 tablet 5 09/07/2024   omeprazole  (PRILOSEC) 20 MG capsule TAKE (1) CAPSULE BY MOUTH ONCE DAILY. 30 capsule 6 09/08/2024 at  8:00 AM   risperiDONE (RISPERDAL) 3 MG tablet Take 3 mg by mouth at bedtime.   09/07/2024 at  8:00 PM   simvastatin  (ZOCOR ) 20 MG tablet Take 1 tablet (20 mg total) by mouth daily. 30 tablet 6 09/08/2024 at  8:00 AM   sodium fluoride (PREVIDENT) 1.1 % GEL dental gel Place 1 Application onto teeth at bedtime.   09/07/2024   tamsulosin  (FLOMAX ) 0.4 MG CAPS capsule Take 1 capsule (0.4 mg total) by mouth daily. 90 capsule 3 09/08/2024 at  8:00 AM   thioridazine (MELLARIL) 100 MG tablet TAKE 1 TABLET BY MOUTH TWICE DAILY 60 tablet 11 09/08/2024 at  8:00 AM    Assessment: Pharmacy consulted to dose heparin for PE in this  58 year old male.  No prior anticoag noted.  Pt had recent surgery so MD requests using lower goal HL , will use 0.3 - 0.5.  10/11 0733 HL 0.11  10/11 1435 HL 0.93 10/11 2300 HL 0.63, elevated  10/12 0626 HL 0.59, elevated   Goal of Therapy:  Heparin level 0.3 - 0.5 units/ml Monitor platelets by anticoagulation protocol: Yes   Plan:  10/12:  HL @ 0626 = 0.59, elevated - Will decrease heparin drip rate to 1500 units/hr and recheck HL 6 hrs after rate change  - CBC daily  Eino Whitner D, PharmD 09/15/2024,7:19 AM

## 2024-09-15 NOTE — Progress Notes (Signed)
      Daily Progress Note   Vitals:   09/15/24 1500 09/15/24 1530 09/15/24 1600 09/15/24 1800  BP: (!) 132/92 (!) 129/92 (!) 139/90   Pulse: (!) 121 (!) 119 (!) 122   Resp: 17 16 17    Temp:   97.7 F (36.5 C) 98.2 F (36.8 C)  TempSrc:   Oral Oral  SpO2: 93% 94% 92%   Weight:      Height:       - hemodynamics and pulm saturation remains unchanged - Keep NPO in case Dr. Marea elects to offer pt pulm thrombectomy   Redell Door, MD, FACS, FSVS Covering for [facility]: [phone]  09/15/2024, 7:02 PM

## 2024-09-15 NOTE — Progress Notes (Signed)
 SLP Cancellation Note  Patient Details Name: Edwin [name] MRN: [id number] DOB: November 27, 1966   Cancelled treatment:       Reason Eval/Treat Not Completed: SLP screened, no needs identified, will sign off (Per RN, RN evaluated pt this morning and no concerns re: swallow function. Discontinuing order per discussion with RN. Please re-consult should pt exhibit new difficulty swallowing or changes to lung status concerning for aspiration PNA.)  Edwin [name], M.S., CCC-SLP Speech-Language Pathologist [facility] [phone] (ASCOM)  Edwin [name] 09/15/2024, 9:57 AM

## 2024-09-15 NOTE — Progress Notes (Signed)
 Attending Progress Note  History: Edwin [name] is here for progressive failure to thrive.  He is 58 year old man with rapidly progressing cervical and thoracic myeloradiculopathy.  He is been having progressive weakness numbness and tingling his bilateral upper and lower extremities.  Has had progressive difficulty with ambulation.  Has went from being ambulatory independent to requiring a rollator.  He feels that his legs do not listen to him anymore.  He was scheduled for a anterior cervical discectomy and fusion and posterior thoracic decompression and fusion, however he became too difficult to manage at home and was admitted to the hospital this past weekend.  POD3: No new events POD2: Dx with pulmonary emboli last night.  Started on anticoagulation due to extensive clot burden.  POD1: Had pain overnight  Physical Exam: Vitals:   09/15/24 0830 09/15/24 0930  BP: (!) 117/94 (!) 137/95  Pulse: (!) 118 (!) 121  Resp: 20 16  Temp:  98.5 F (36.9 C)  SpO2: 96% 94%    AA O CNI  Strength:   He is more than antigravity throughout.  Slow but 5/5 BUE 3-4-/5 BLE  Drain not well documented  Data:  Recent Labs  Lab 09/12/24 0425 09/13/24 2114 09/14/24 0254  NA 141 138 139  K 3.9 3.9 3.8  CL 109 104 106  CO2 27 23 25   BUN 23* 25* 23*  CREATININE 1.92* 1.59* 1.54*  GLUCOSE 97 127* 110*  CALCIUM 8.7* 8.3* 8.2*   Recent Labs  Lab 09/13/24 2114  AST 29  ALT 19  ALKPHOS 81     Recent Labs  Lab 09/13/24 2114 09/14/24 0254 09/15/24 0626  WBC 10.0 9.9 11.4*  HGB 12.6* 12.5* 12.4*  HCT 38.7* 37.7* 37.5*  PLT 132* 126* 136*   No results for input(s): APTT, INR in the last 168 hours.       Assessment/Plan:  Edwin [name] is a 58 year old gentleman with some developmental delay who was experiencing a progressive loss of ambulatory function and coordination in his upper extremities.  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    I have discussed his case with both Dr. Awanda and Dr. Tamea.  He is at substantial risk without anticoagulation due to his clot burden per Dr. Tamea, so requires anticoagulation.  This puts him at significant risk of a post-operative clot.  I have disclosed that to the patient's family.  - Continue drain - discussed early intubation with ICU team if he is noted to have any developing anterior cervical clot - Vascular to evaluate for pulmonary thrombectomy  [name] MD Department of Neurosurgery

## 2024-09-15 NOTE — Progress Notes (Signed)
      Daily Progress Note   Assessment/Planning:   POD #3 s/p multi-level spine surgery complicated by DVT  Per pt's family, pt has not been ambulatory since post-op Per Dr Dreama, normally he would test pt by ambulating the pt, essentially stress testing the patient's pulm function I suspect pt would likely fail as mildly more tachycardiac, mild elevate temperature with occasional drop in saturation today Patient still does not meet high risk criteria for PE so does not need emergent thrombectomy Will make patient NPO after midnight in case Dr. Marea elects to proceed with Pulmonary artery thrombectomy   Subjective  - 3 Days Post-Op   Drowsy, no complaints   Objective   Vitals:   09/14/24 1945 09/15/24 0000 09/15/24 0400 09/15/24 0730  BP: (!) 125/92 (!) 142/90 (!) 129/95 138/87  Pulse: (!) 117 (!) 115 (!) 117 (!) 121  Resp: 18 (!) 22 17 12   Temp: 100 F (37.8 C) 99.7 F (37.6 C) 99.7 F (37.6 C) 99.3 F (37.4 C)  TempSrc: Axillary Axillary Axillary Oral  SpO2: 93% 94% 94% 96%  Weight:      Height:         Intake/Output Summary (Last 24 hours) at 09/15/2024 0904 Last data filed at 09/15/2024 0500 Gross per 24 hour  Intake 209.05 ml  Output 700 ml  Net -490.95 ml    PULM Sym exp, good air movt, SaO2 on RA 89-93% (at bedside)    Laboratory   CBC    Latest Ref Rng & Units 09/15/2024    6:26 AM 09/14/2024    2:54 AM 09/13/2024    9:14 PM  CBC  WBC 4.0 - 10.5 K/uL 11.4  9.9  10.0   Hemoglobin 13.0 - 17.0 g/dL 87.5  87.4  87.3   Hematocrit 39.0 - 52.0 % 37.5  37.7  38.7   Platelets 150 - 400 K/uL 136  126  132     BMET    Component Value Date/Time   NA 139 09/14/2024 0254   NA 142 04/19/2024 0713   K 3.8 09/14/2024 0254   CL 106 09/14/2024 0254   CO2 25 09/14/2024 0254   GLUCOSE 110 (H) 09/14/2024 0254   BUN 23 (H) 09/14/2024 0254   BUN 14 04/19/2024 0713   CREATININE 1.54 (H) 09/14/2024 0254   CREATININE 1.58 (H) 11/20/2020 0902   CALCIUM 8.2  (L) 09/14/2024 0254   GFRNONAA 52 (L) 09/14/2024 0254   GFRNONAA 49 (L) 11/20/2020 0902   GFRAA 56 (L) 11/20/2020 0902     Redell Door, MD, FACS, FSVS Covering for [facility]: [phone]  09/15/2024, 9:04 AM

## 2024-09-15 NOTE — Plan of Care (Signed)
  Problem: Clinical Measurements: Goal: Will remain free from infection Outcome: Progressing   Problem: Coping: Goal: Level of anxiety will decrease Outcome: Progressing   Problem: Elimination: Goal: Will not experience complications related to urinary retention Outcome: Progressing   Problem: Pain Managment: Goal: General experience of comfort will improve and/or be controlled Outcome: Progressing   Problem: Safety: Goal: Ability to remain free from injury will improve Outcome: Progressing

## 2024-09-15 NOTE — Progress Notes (Signed)
 PHARMACY - ANTICOAGULATION CONSULT NOTE  Pharmacy Consult for Heparin  Indication: pulmonary embolus  Allergies  Allergen Reactions   Citrullus Vulgaris Hives    Patient Measurements: Height: 6' 2 (188 cm) Weight: (!) 142.2 kg (313 lb 7.9 oz) IBW/kg (Calculated) : 82.2 HEPARIN DW (KG): 114.6  Vital Signs: Temp: 99.9 F (37.7 C) (10/12 1945) Temp Source: Axillary (10/12 1945) BP: 128/69 (10/12 1945) Pulse Rate: 118 (10/12 1945)  Labs: Recent Labs    09/13/24 2114 09/14/24 0254 09/14/24 0733 09/15/24 0626 09/15/24 1358 09/15/24 2027  HGB 12.6* 12.5*  --  12.4*  --   --   HCT 38.7* 37.7*  --  37.5*  --   --   PLT 132* 126*  --  136*  --   --   HEPARINUNFRC  --   --    < > 0.59 0.40 0.37  CREATININE 1.59* 1.54*  --   --   --   --    < > = values in this interval not displayed.    Estimated Creatinine Clearance: 77.6 mL/min (A) (by C-G formula based on SCr of 1.54 mg/dL (H)).   Medical History: Past Medical History:  Diagnosis Date   Diabetes mellitus without complication (HCC)    Hypercholesteremia    Medical history non-contributory     Medications:  Medications Prior to Admission  Medication Sig Dispense Refill Last Dose/Taking   acetaminophen  (TYLENOL ) 500 MG tablet Take 1,000 mg by mouth 2 (two) times daily as needed for moderate pain (pain score 4-6).   Unknown   Amantadine  HCl 100 MG tablet Take 1 tablet (100 mg total) by mouth 2 (two) times daily. 60 tablet 11 09/08/2024 at  8:00 AM   clonazePAM (KLONOPIN) 0.5 MG tablet Take 0.5 mg by mouth at bedtime as needed for anxiety.   Unknown   docusate sodium  (COLACE) 100 MG capsule Take 1 capsule (100 mg total) by mouth 3 (three) times daily. 90 capsule 11 09/08/2024 at  8:00 AM   empagliflozin  (JARDIANCE ) 10 MG TABS tablet Take 1 tablet (10 mg total) by mouth daily. 30 tablet 11 09/08/2024 at  8:00 AM   escitalopram (LEXAPRO) 10 MG tablet Take 10 mg by mouth daily.   09/08/2024 at  8:00 AM    HYDROcodone -acetaminophen  (NORCO/VICODIN) 5-325 MG tablet Take 1 tablet by mouth 3 (three) times daily as needed for moderate pain (pain score 4-6) or severe pain (pain score 7-10). 90 tablet 0 09/08/2024 at  8:00 AM   meloxicam  (MOBIC ) 15 MG tablet TAKE 1 TABLET BY MOUTH ONCE DAILY FOR ARTHRITIS FLAIR/PAIN (TAKE WITH FOOD) AS NEEDED 30 tablet 5 09/07/2024   omeprazole  (PRILOSEC) 20 MG capsule TAKE (1) CAPSULE BY MOUTH ONCE DAILY. 30 capsule 6 09/08/2024 at  8:00 AM   risperiDONE (RISPERDAL) 3 MG tablet Take 3 mg by mouth at bedtime.   09/07/2024 at  8:00 PM   simvastatin  (ZOCOR ) 20 MG tablet Take 1 tablet (20 mg total) by mouth daily. 30 tablet 6 09/08/2024 at  8:00 AM   sodium fluoride (PREVIDENT) 1.1 % GEL dental gel Place 1 Application onto teeth at bedtime.   09/07/2024   tamsulosin  (FLOMAX ) 0.4 MG CAPS capsule Take 1 capsule (0.4 mg total) by mouth daily. 90 capsule 3 09/08/2024 at  8:00 AM   thioridazine (MELLARIL) 100 MG tablet TAKE 1 TABLET BY MOUTH TWICE DAILY 60 tablet 11 09/08/2024 at  8:00 AM    Assessment: Pharmacy consulted to dose heparin for PE in this  58 year old male.  No prior anticoag noted.  Pt had recent surgery so MD requests using lower goal HL , will use 0.3 - 0.5.  10/11 0733 HL 0.11  10/11 1435 HL 0.93 10/11 2300 HL 0.63, elevated  10/12 0626 HL 0.59, elevated  10/12 1358 HL 0.4, therapeutic x1 10/12 2027 HL 0.37, therapeutic x2   Goal of Therapy:  Heparin level 0.3 - 0.5 units/ml Monitor platelets by anticoagulation protocol: Yes   Plan:  - Heparin level is therapeutic.  - Will continue heparin infusion at 1500 units/hr. Recheck heparin level in daily with AM labs given x2 consecutive therapeutic levels  - CBC daily while on heparin.   Annabella LOISE Banks, PharmD Clinical Pharmacist 09/15/2024 9:30 PM

## 2024-09-16 ENCOUNTER — Encounter: Payer: Self-pay | Admitting: Vascular Surgery

## 2024-09-16 ENCOUNTER — Encounter: Payer: Self-pay | Admitting: Anesthesiology

## 2024-09-16 ENCOUNTER — Encounter
Admission: EM | Disposition: A | Discharge status: Skilled Nursing Facility | Payer: Self-pay | Source: Home / Self Care | Attending: Hospitalist

## 2024-09-16 DIAGNOSIS — I2699 Other pulmonary embolism without acute cor pulmonale: Secondary | ICD-10-CM | POA: Diagnosis not present

## 2024-09-16 DIAGNOSIS — R296 Repeated falls: Secondary | ICD-10-CM | POA: Diagnosis not present

## 2024-09-16 HISTORY — PX: PULMONARY THROMBECTOMY: CATH118295

## 2024-09-16 LAB — BASIC METABOLIC PANEL WITH GFR
Anion gap: 11 (ref 5–15)
BUN: 51 mg/dL — ABNORMAL HIGH (ref 6–20)
CO2: 22 mmol/L (ref 22–32)
Calcium: 8.3 mg/dL — ABNORMAL LOW (ref 8.9–10.3)
Chloride: 106 mmol/L (ref 98–111)
Creatinine, Ser: 1.96 mg/dL — ABNORMAL HIGH (ref 0.61–1.24)
GFR, Estimated: 39 mL/min — ABNORMAL LOW (ref >60)
Glucose, Bld: 149 mg/dL — ABNORMAL HIGH (ref 70–99)
Potassium: 4.2 mmol/L (ref 3.5–5.1)
Sodium: 139 mmol/L (ref 135–145)

## 2024-09-16 LAB — CBC
HCT: 34.5 % — ABNORMAL LOW (ref 39.0–52.0)
Hemoglobin: 11.1 g/dL — ABNORMAL LOW (ref 13.0–17.0)
MCH: 27.7 pg (ref 26.0–34.0)
MCHC: 32.2 g/dL (ref 30.0–36.0)
MCV: 86 fL (ref 80.0–100.0)
Platelets: 186 K/uL (ref 150–400)
RBC: 4.01 MIL/uL — ABNORMAL LOW (ref 4.22–5.81)
RDW: 13.2 % (ref 11.5–15.5)
WBC: 11.4 K/uL — ABNORMAL HIGH (ref 4.0–10.5)
nRBC: 0 % (ref 0.0–0.2)

## 2024-09-16 LAB — HEPARIN LEVEL (UNFRACTIONATED): Heparin Unfractionated: 0.31 [IU]/mL (ref 0.30–0.70)

## 2024-09-16 SURGERY — PULMONARY THROMBECTOMY
Anesthesia: Moderate Sedation

## 2024-09-16 SURGERY — PULMONARY THROMBECTOMY
Anesthesia: Moderate Sedation | Laterality: Bilateral

## 2024-09-16 MED ORDER — CEFAZOLIN SODIUM-DEXTROSE 2-4 GM/100ML-% IV SOLN
2.0000 g | INTRAVENOUS | Status: AC
Start: 2024-09-16 — End: 2024-09-16
  Administered 2024-09-16: 2 g via INTRAVENOUS

## 2024-09-16 MED ORDER — FENTANYL CITRATE (PF) 50 MCG/ML IJ SOSY
PREFILLED_SYRINGE | INTRAMUSCULAR | Status: AC
  Filled 2024-09-16: qty 2

## 2024-09-16 MED ORDER — MIDAZOLAM HCL 2 MG/ML PO SYRP: 8.0000 mg | ORAL_SOLUTION | Freq: Once | ORAL | Status: DC | PRN

## 2024-09-16 MED ORDER — HEPARIN (PORCINE) IN NACL 1000-0.9 UT/500ML-% IV SOLN
INTRAVENOUS | Status: DC | PRN
  Administered 2024-09-16 (×2): 500 mL

## 2024-09-16 MED ORDER — SODIUM CHLORIDE 0.9 % IV SOLN: INTRAVENOUS | Status: AC

## 2024-09-16 MED ORDER — FAMOTIDINE 20 MG PO TABS: 40.0000 mg | ORAL_TABLET | Freq: Once | ORAL | Status: DC | PRN

## 2024-09-16 MED ORDER — MIDAZOLAM HCL 2 MG/2ML IJ SOLN
INTRAMUSCULAR | Status: AC
  Filled 2024-09-16: qty 4

## 2024-09-16 MED ORDER — ALBUTEROL SULFATE (2.5 MG/3ML) 0.083% IN NEBU
2.5000 mg | INHALATION_SOLUTION | RESPIRATORY_TRACT | Status: DC | PRN
  Administered 2024-09-17: 2.5 mg via RESPIRATORY_TRACT
  Filled 2024-09-16: qty 3

## 2024-09-16 MED ORDER — MIDAZOLAM HCL 2 MG/2ML IJ SOLN
INTRAMUSCULAR | Status: DC | PRN
  Administered 2024-09-16 (×2): 1 mg via INTRAVENOUS

## 2024-09-16 MED ORDER — LIDOCAINE-EPINEPHRINE (PF) 1 %-1:200000 IJ SOLN
INTRAMUSCULAR | Status: DC | PRN
  Administered 2024-09-16: 10 mL

## 2024-09-16 MED ORDER — METHYLPREDNISOLONE SODIUM SUCC 125 MG IJ SOLR: 125.0000 mg | Freq: Once | INTRAMUSCULAR | Status: DC | PRN

## 2024-09-16 MED ORDER — CEFAZOLIN SODIUM-DEXTROSE 2-4 GM/100ML-% IV SOLN
INTRAVENOUS | Status: AC
  Filled 2024-09-16: qty 100

## 2024-09-16 MED ORDER — HEPARIN SODIUM (PORCINE) 1000 UNIT/ML IJ SOLN
INTRAMUSCULAR | Status: AC
  Filled 2024-09-16: qty 10

## 2024-09-16 MED ORDER — HEPARIN SODIUM (PORCINE) 1000 UNIT/ML IJ SOLN
INTRAMUSCULAR | Status: DC | PRN
  Administered 2024-09-16: 3000 [IU] via INTRAVENOUS

## 2024-09-16 MED ORDER — FENTANYL CITRATE (PF) 100 MCG/2ML IJ SOLN
INTRAMUSCULAR | Status: DC | PRN
  Administered 2024-09-16 (×2): 25 ug via INTRAVENOUS

## 2024-09-16 MED ORDER — IODIXANOL 320 MG/ML IV SOLN
INTRAVENOUS | Status: DC | PRN
  Administered 2024-09-16: 50 mL via INTRAVENOUS

## 2024-09-16 MED ORDER — SODIUM CHLORIDE 0.9 % IV SOLN: INTRAVENOUS | Status: DC

## 2024-09-16 MED ORDER — DIPHENHYDRAMINE HCL 50 MG/ML IJ SOLN: 50.0000 mg | Freq: Once | INTRAMUSCULAR | Status: DC | PRN

## 2024-09-16 SURGICAL SUPPLY — 16 items
CANISTER PENUMBRA ENGINE (MISCELLANEOUS) IMPLANT
CATH ANGIO 5F PIGTAIL 100CM (CATHETERS) IMPLANT
CATH INDIGO 12XTORQ 100 (CATHETERS) IMPLANT
CATH INDIGO SEP 12 (CATHETERS) IMPLANT
CATH SELECT BERN TIP 5F 130 (CATHETERS) IMPLANT
CLOSURE PERCLOSE PROSTYLE (Vascular Products) IMPLANT
COVER PROBE ULTRASOUND 5X96 (MISCELLANEOUS) IMPLANT
GLIDEWIRE ADV .035X260CM (WIRE) IMPLANT
PACK ANGIOGRAPHY (CUSTOM PROCEDURE TRAY) ×1 IMPLANT
SHEATH BRITE TIP 6FRX11 (SHEATH) IMPLANT
SHEATH CHCK-FLO 14FR 13 (SHEATH) IMPLANT
SUT MNCRL AB 4-0 PS2 18 (SUTURE) IMPLANT
SYR MEDRAD MARK 7 150ML (SYRINGE) IMPLANT
TUBING CONTRAST HIGH PRESS 72 (TUBING) IMPLANT
WIRE J 3MM .035X145CM (WIRE) IMPLANT
WIRE SUPRACORE 300CM (WIRE) IMPLANT

## 2024-09-16 NOTE — Progress Notes (Signed)
 Attending Progress Note  History: Edwin [name] is here for progressive failure to thrive.  He is 58 year old man with rapidly progressing cervical and thoracic myeloradiculopathy.  He is been having progressive weakness numbness and tingling his bilateral upper and lower extremities.  Has had progressive difficulty with ambulation.  Has went from being ambulatory independent to requiring a rollator.  He feels that his legs do not listen to him anymore.  He was scheduled for a anterior cervical discectomy and fusion and posterior thoracic decompression and fusion, however he became too difficult to manage at home and was admitted to the hospital this past weekend.  POD4: Pt states he is doing well this morning.  tachycardic on monitor.  POD3: No new events POD2: Dx with pulmonary emboli last night.  Started on anticoagulation due to extensive clot burden.  POD1: Had pain overnight  Physical Exam: Vitals:   09/16/24 0400 09/16/24 0515  BP: 108/82   Pulse: (!) 115 (!) 113  Resp: 16 13  Temp: 98.9 F (37.2 C)   SpO2: 95% 95%    AA O CNI  Strength:   He is more than antigravity throughout.  Slow but 5/5 BUE 3-4-/5 BLE  HV output documented as 250 overnight   Data:  Recent Labs  Lab 09/13/24 2114 09/14/24 0254 09/16/24 0303  NA 138 139 139  K 3.9 3.8 4.2  CL 104 106 106  CO2 23 25 22   BUN 25* 23* 51*  CREATININE 1.59* 1.54* 1.96*  GLUCOSE 127* 110* 149*  CALCIUM 8.3* 8.2* 8.3*   Recent Labs  Lab 09/13/24 2114  AST 29  ALT 19  ALKPHOS 81     Recent Labs  Lab 09/14/24 0254 09/15/24 0626 09/16/24 0303  WBC 9.9 11.4* 11.4*  HGB 12.5* 12.4* 11.1*  HCT 37.7* 37.5* 34.5*  PLT 126* 136* 186   No results for input(s): APTT, INR in the last 168 hours.       Assessment/Plan:  Edwin [name] is a 58 year old gentleman with some developmental delay who was experiencing a progressive loss of ambulatory function and coordination in his upper extremities.  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   I have discussed his case with both Dr. Awanda and Dr. Tamea.  He is at substantial risk without anticoagulation due to his clot burden per Dr. Tamea, so requires anticoagulation.  This puts him at significant risk of a post-operative clot.  I have disclosed that to the patient's family.  - Continue drain. Will replace accordion portion of drain this morning  - early intubation with ICU team if he is noted to have any developing anterior cervical clot - Plans to go for thrombectomy with vascular today  Edsel Goods PA-C Department of Neurosurgery

## 2024-09-16 NOTE — Op Note (Signed)
 [facility]  Percutaneous Study/Intervention Procedural Note   Date of Surgery: 09/16/2024,11:31 AM  Surgeon: Selinda Gu  Pre-operative Diagnosis: Symptomatic bilateral pulmonary emboli  Post-operative diagnosis:  Same  Procedure(s) Performed:  1.  Contrast injection right heart  2.  Mechanical thrombectomy using the penumbra CAT 12 catheter to the left upper and lower lobe pulmonary arteries in the right lower, middle, and upper lobe pulmonary arteries  3.  Selective catheter placement left lower and upper lobe pulmonary artery  4.  Selective catheter placement right lower lobe, middle lobe, and upper lobe pulmonary arteries      Anesthesia: Conscious sedation was administered under my direct supervision by the interventional radiology RN. IV Versed plus fentanyl were utilized. Continuous ECG, pulse oximetry and blood pressure was monitored throughout the entire procedure.  Versed and fentanyl were administered intravenously.  Conscious sedation was administered for a total of 36 minutes using 2 mg of Versed and 50 mcg of Fentanyl.  EBL: 375 cc  Sheath: 14 French right femoral vein  Contrast: 50 cc   Fluoroscopy Time: 9.2 minutes  Indications:  Patient presents with pulmonary emboli. The patient is symptomatic with hypoxemia and dyspnea on exertion.  There is evidence of right heart strain on the CT angiogram. The patient is otherwise a good candidate for intervention and even the long-term benefits pulmonary angiography with thrombolysis is offered. The risks and benefits are reviewed long-term benefits are discussed. All questions are answered patient agrees to proceed.  Procedure:  Edwin [name] a 58 y.o. male who was identified and appropriate procedural time out was performed.  The patient was then placed supine on the table and prepped and draped in the usual sterile fashion.  Ultrasound was used to evaluate the right common femoral vein.  It was  patent, as it was echolucent and compressible.  A digital ultrasound image was acquired for the permanent record.  A Seldinger needle was used to access the right common femoral vein under direct ultrasound guidance.  A 0.035 J wire was advanced without resistance and a 6Fr sheath was placed. A proglide device was placed in a preclose fashion and then upsized to a 14 [place] sheath.    The Advantage wire and pigtail catheter were then negotiated into the right atrium and bolus injection of contrast was utilized to demonstrate the right ventricle and the pulmonary artery outflow. The Advantage wire and catheter were then negotiated into the the pulmonary arteries.  The left pulmonary artery was cannulated and advanced into the left upper lobe and left lower lobe for selective imaging. This demonstrated fairly extensive thrombus in the proximal left upper lobe pulmonary artery and in the left lower lobe pulmonary artery.  I then transitioned to the right side with the advantage wire and catheter and first cannulated the right lower lobe and then the right middle and upper lobes.  This demonstrated significant thrombus burden in the right middle and upper lobe pulmonary arteries as well as the right lower lobe pulmonary artery.  3000 units of heparin was then given and allowed to circulate.   The Penumbra Cat 12 catheter was then advanced up into the pulmonary vasculature. The left lung was addressed first. Catheter was negotiated into the left lower lobe pulmonary artery and mechanical thrombectomy was performed with the help of the separator. Follow-up imaging demonstrated a good result and therefore the catheter was renegotiated into the left upper lobe pulmonary artery and again mechanical thrombectomy was performed. Passes  were made with both the Penumbra catheter itself as well as introducing the separator. Follow-up imaging was then performed.  Significant improvement was seen so we elected to turn  our attention to the right side  The Penumbra Cat 12 catheter was then negotiated to the opposite side. The right lung was then addressed. Catheter was negotiated into the right lower lobe pulmonary artery and mechanical thrombectomy was performed with the separator. The catheter was renegotiated into the right middle lobe as well as the right upper lobe pulmonary artery and again mechanical thrombectomy was performed in both branches. Passes were made with both the Penumbra catheter itself as well as introducing the separator. Follow-up imaging was then performed.  Significant improvement was seen so I elected to terminate the procedure.  After review these images wires were reintroduced and the catheter is removed. Then, the sheath is then pulled, the proglide device is secured, a Monocryl skin suture was placed and pressure is held. A sterile dressing is placed.    Findings:   Right heart imaging:  Right atrium and right ventricle and the pulmonary outflow tract appears somewhat dilated  Right lung:  This demonstrated significant thrombus burden in the right middle and upper lobe pulmonary arteries as well as the right lower lobe pulmonary artery.  Left lung:  This demonstrated fairly extensive thrombus in the proximal left upper lobe pulmonary artery and in the left lower lobe pulmonary artery.     Disposition: Patient was taken to the recovery room in stable condition having tolerated the procedure well.  Edwin [name] 09/16/2024,11:31 AM

## 2024-09-16 NOTE — Interval H&P Note (Signed)
 History and Physical Interval Note:  09/16/2024 10:27 AM  Edwin [name]  has presented today for surgery, with the diagnosis of PE.  The various methods of treatment have been discussed with the patient and family. After consideration of risks, benefits and other options for treatment, the patient has consented to  Procedure(s): PULMONARY THROMBECTOMY (Bilateral) as a surgical intervention.  The patient's history has been reviewed, patient examined, no change in status, stable for surgery.  I have reviewed the patient's chart and labs.  Questions were answered to the patient's satisfaction.     [name]

## 2024-09-16 NOTE — Anesthesia Postprocedure Evaluation (Signed)
 Anesthesia Post Note  Patient: Edwin [name]  Procedure(s) Performed: C3-4 anterior cervical discectomy and fusion T2-T3 right sided transpedicular decompression with laminectomy, T1-T4 posterior spinal instrumentation and fusion APPLICATION OF INTRAOPERATIVE CT SCAN  Patient location during evaluation: PACU Anesthesia Type: General Level of consciousness: awake and alert Pain management: pain level controlled Vital Signs Assessment: post-procedure vital signs reviewed and stable Respiratory status: spontaneous breathing, nonlabored ventilation, respiratory function stable and patient connected to nasal cannula oxygen Cardiovascular status: blood pressure returned to baseline and stable Postop Assessment: no apparent nausea or vomiting Anesthetic complications: no   No notable events documented.   Last Vitals:  Vitals:   09/16/24 0400 09/16/24 0515  BP: 108/82   Pulse: (!) 115 (!) 113  Resp: 16 13  Temp: 37.2 C   SpO2: 95% 95%    Last Pain:  Vitals:   09/16/24 0515  TempSrc:   PainSc: 4                  Lendia LITTIE Mae

## 2024-09-16 NOTE — Progress Notes (Signed)
 OT Cancellation Note  Patient Details Name: Edwin [name] MRN: [id number] DOB: 11/16/1966   Cancelled Treatment:    Reason Eval/Treat Not Completed: Medical issues which prohibited therapy. Patient anticipated to have pulmonary thrombectomy today. OT to hold and re-evaluate tomorrow if appropriate.   Izetta Claude, MS, OTR/L , CBIS ascom [phone]  09/16/24, 10:44 AM

## 2024-09-16 NOTE — Progress Notes (Signed)
 PHARMACY - ANTICOAGULATION CONSULT NOTE  Pharmacy Consult for Heparin  Indication: pulmonary embolus  Allergies  Allergen Reactions   Citrullus Vulgaris Hives    Patient Measurements: Height: 6' 2 (188 cm) Weight: (!) 142.2 kg (313 lb 7.9 oz) IBW/kg (Calculated) : 82.2 HEPARIN DW (KG): 114.6  Vital Signs: Temp: 99.9 F (37.7 C) (10/12 1945) Temp Source: Axillary (10/12 1945) BP: 128/69 (10/12 1945) Pulse Rate: 118 (10/12 1945)  Labs: Recent Labs    09/13/24 2114 09/14/24 0254 09/14/24 0733 09/15/24 0626 09/15/24 1358 09/15/24 2027 09/16/24 0303  HGB 12.6* 12.5*  --  12.4*  --   --  11.1*  HCT 38.7* 37.7*  --  37.5*  --   --  34.5*  PLT 132* 126*  --  136*  --   --  186  HEPARINUNFRC  --   --    < > 0.59 0.40 0.37 0.31  CREATININE 1.59* 1.54*  --   --   --   --  1.96*   < > = values in this interval not displayed.    Estimated Creatinine Clearance: 61 mL/min (A) (by C-G formula based on SCr of 1.96 mg/dL (H)).   Medical History: Past Medical History:  Diagnosis Date   Diabetes mellitus without complication (HCC)    Hypercholesteremia    Medical history non-contributory     Medications:  Medications Prior to Admission  Medication Sig Dispense Refill Last Dose/Taking   acetaminophen  (TYLENOL ) 500 MG tablet Take 1,000 mg by mouth 2 (two) times daily as needed for moderate pain (pain score 4-6).   Unknown   Amantadine  HCl 100 MG tablet Take 1 tablet (100 mg total) by mouth 2 (two) times daily. 60 tablet 11 09/08/2024 at  8:00 AM   clonazePAM (KLONOPIN) 0.5 MG tablet Take 0.5 mg by mouth at bedtime as needed for anxiety.   Unknown   docusate sodium  (COLACE) 100 MG capsule Take 1 capsule (100 mg total) by mouth 3 (three) times daily. 90 capsule 11 09/08/2024 at  8:00 AM   empagliflozin  (JARDIANCE ) 10 MG TABS tablet Take 1 tablet (10 mg total) by mouth daily. 30 tablet 11 09/08/2024 at  8:00 AM   escitalopram (LEXAPRO) 10 MG tablet Take 10 mg by mouth daily.    09/08/2024 at  8:00 AM   HYDROcodone -acetaminophen  (NORCO/VICODIN) 5-325 MG tablet Take 1 tablet by mouth 3 (three) times daily as needed for moderate pain (pain score 4-6) or severe pain (pain score 7-10). 90 tablet 0 09/08/2024 at  8:00 AM   meloxicam  (MOBIC ) 15 MG tablet TAKE 1 TABLET BY MOUTH ONCE DAILY FOR ARTHRITIS FLAIR/PAIN (TAKE WITH FOOD) AS NEEDED 30 tablet 5 09/07/2024   omeprazole  (PRILOSEC) 20 MG capsule TAKE (1) CAPSULE BY MOUTH ONCE DAILY. 30 capsule 6 09/08/2024 at  8:00 AM   risperiDONE (RISPERDAL) 3 MG tablet Take 3 mg by mouth at bedtime.   09/07/2024 at  8:00 PM   simvastatin  (ZOCOR ) 20 MG tablet Take 1 tablet (20 mg total) by mouth daily. 30 tablet 6 09/08/2024 at  8:00 AM   sodium fluoride (PREVIDENT) 1.1 % GEL dental gel Place 1 Application onto teeth at bedtime.   09/07/2024   tamsulosin  (FLOMAX ) 0.4 MG CAPS capsule Take 1 capsule (0.4 mg total) by mouth daily. 90 capsule 3 09/08/2024 at  8:00 AM   thioridazine (MELLARIL) 100 MG tablet TAKE 1 TABLET BY MOUTH TWICE DAILY 60 tablet 11 09/08/2024 at  8:00 AM    Assessment: Pharmacy consulted  to dose heparin for PE in this 58 year old male.  No prior anticoag noted.  Pt had recent surgery so MD requests using lower goal HL , will use 0.3 - 0.5.  10/11 0733 HL 0.11  10/11 1435 HL 0.93 10/11 2300 HL 0.63, elevated  10/12 0626 HL 0.59, elevated  10/12 1358 HL 0.4, therapeutic x1 10/12 2027 HL 0.37, therapeutic x2 10/13 0303 HL 0.31, therapeutic X 3    Goal of Therapy:  Heparin level 0.3 - 0.5 units/ml Monitor platelets by anticoagulation protocol: Yes   Plan:  10/13:  HL @ 0303 = 0.31, therapeutic X 3 - heparin is therapeutic but at lowest end of range and trending down so will increase drip rate slightly to 1600 units/hr - recheck HL on 10/14 with AM labs  - CBC daily while on heparin.   Silver Selinda BIRCH, PharmD Clinical Pharmacist 09/16/2024 3:48 AM

## 2024-09-16 NOTE — Progress Notes (Signed)
  PROGRESS NOTE    Edwin [name]  FMW:[id number] DOB: April 02, 1966 DOA: 09/08/2024 PCP: Liana Fish, NP  IC08A/[id number]-AA  LOS: 8 days   Brief hospital course:   Assessment & Plan: 58 year old male with history of GERD, depression, anxiety, hyperlipidemia, morbid obesity, who presents ED for chief concerns of falling episodes at group home.    Severe cervical and thoracic myeloradiculopathy  S/p C3-4 anterior cervical discectomy and fusion and T2-T3 right sided transpedicular decompression with laminectomy, T1-T4 posterior spinal instrumentation and fusion on 09/12/24 --drain management  --pain management --monitor for development of anterior cervical clot. --early intubation with ICU team if pt is noted to have any developing anterior cervical clot.  Acute PE and DVT --dx 1 day after spine surgery.  Significant clot burden.  Echo could not visualize right heart.  After discussing benefits and risk of anticoagulation with Dr. Clois and Dr. Tamea, decision was to continue full anticoagulation due to significant risk of worsening into saddle embolus if anticoagulation is held.   --Echo couldn't visualize right heart --cont heparin gtt --thrombectomy today  AKI --Cr went from 1.54 to 1.96, without doubling of BUN, likely due to dehydration. --start MIVF  Obesity, Class III, BMI 40-49.9 (morbid obesity) (HCC) This complicates overall care and prognosis.    Falling episodes Abnormality of gait Suspect secondary to spinal stenosis Patient is not endorsing pain --definitive tx for spinal stenosis --PT/OT   Schizoaffective disorder (HCC) Home risperidone 3 mg nightly, Thioridazine 100 mg p.o. twice daily, escitalopram 10 mg daily  --cont home regimen   Hyperlipidemia --cont statin   Type 2 diabetes mellitus with stage 3a chronic kidney disease, without long-term current use of insulin (HCC) --no need for BG checks   Stage 3b chronic kidney disease (HCC) At  baseline   Sleep apnea in adult CPAP nightly ordered   Delusion (HCC) With autism  Allergy --cont claritin and eye drops, per pt and mother request   DVT prophylaxis: Nw:yzejmpw gtt Code Status: Full code  Family Communication: sister updated at bedside today Level of care: Stepdown Dispo:   The patient is from: group home Anticipated d/c is to: to be determined Anticipated d/c date is: > 3 days   Subjective and Interval History:  Pt underwent thrombectomy today.  Tolerated it well.    Drink water afterwards with no more throat pain.   Objective: Vitals:   09/16/24 1800 09/16/24 1831 09/16/24 1900 09/16/24 1930  BP: 101/65 107/71 104/68 90/72  Pulse: (!) 118 (!) 118 (!) 117 (!) 116  Resp: 18 19 20    Temp:  98.1 F (36.7 C)    TempSrc:  Oral    SpO2: 95% 94% 94% 94%  Weight:      Height:        Intake/Output Summary (Last 24 hours) at 09/16/2024 1956 Last data filed at 09/16/2024 1739 Gross per 24 hour  Intake 251.75 ml  Output 970 ml  Net -718.25 ml   Filed Weights   09/09/24 0336 09/12/24 0835 09/14/24 1350  Weight: (!) 142.2 kg (!) 142.2 kg (!) 142.2 kg    Examination:   Constitutional: NAD, awake, oriented HEENT: conjunctivae and lids normal, EOMI CV: No cyanosis.   RESP: normal respiratory effort, on RA Neuro: II - XII grossly intact.     Data Reviewed: I have personally reviewed labs and imaging studies  Time spent: 50 minutes   Ellouise Haber, MD [facility] If 7PM-7AM, please contact night-coverage 09/16/2024, 7:56 PM

## 2024-09-16 NOTE — Plan of Care (Signed)
  Problem: Clinical Measurements: Goal: Respiratory complications will improve Outcome: Progressing   Problem: Coping: Goal: Level of anxiety will decrease Outcome: Progressing   Problem: Elimination: Goal: Will not experience complications related to urinary retention Outcome: Progressing   Problem: Pain Managment: Goal: General experience of comfort will improve and/or be controlled Outcome: Progressing   Problem: Skin Integrity: Goal: Risk for impaired skin integrity will decrease Outcome: Progressing

## 2024-09-16 NOTE — Progress Notes (Signed)
 PT Cancellation Note  Patient Details Name: PATON CRUM MRN: [id number] DOB: 1966/12/01   Cancelled Treatment:    Reason Eval/Treat Not Completed: Patient at procedure or test/unavailable (Patient anticipated to have pulmonary thrombectomy. Will follow up as appropriate)  Randine Essex, PT, MPT  Randine LULLA Essex 09/16/2024, 10:28 AM

## 2024-09-17 DIAGNOSIS — I2699 Other pulmonary embolism without acute cor pulmonale: Secondary | ICD-10-CM

## 2024-09-17 DIAGNOSIS — R296 Repeated falls: Secondary | ICD-10-CM | POA: Diagnosis not present

## 2024-09-17 LAB — BASIC METABOLIC PANEL WITH GFR
Anion gap: 14 (ref 5–15)
BUN: 70 mg/dL — ABNORMAL HIGH (ref 6–20)
CO2: 24 mmol/L (ref 22–32)
Calcium: 8.1 mg/dL — ABNORMAL LOW (ref 8.9–10.3)
Chloride: 102 mmol/L (ref 98–111)
Creatinine, Ser: 2.3 mg/dL — ABNORMAL HIGH (ref 0.61–1.24)
GFR, Estimated: 32 mL/min — ABNORMAL LOW (ref >60)
Glucose, Bld: 163 mg/dL — ABNORMAL HIGH (ref 70–99)
Potassium: 3.9 mmol/L (ref 3.5–5.1)
Sodium: 140 mmol/L (ref 135–145)

## 2024-09-17 LAB — CBC
HCT: 27.3 % — ABNORMAL LOW (ref 39.0–52.0)
Hemoglobin: 9.1 g/dL — ABNORMAL LOW (ref 13.0–17.0)
MCH: 28.2 pg (ref 26.0–34.0)
MCHC: 33.3 g/dL (ref 30.0–36.0)
MCV: 84.5 fL (ref 80.0–100.0)
Platelets: 211 K/uL (ref 150–400)
RBC: 3.23 MIL/uL — ABNORMAL LOW (ref 4.22–5.81)
RDW: 13.3 % (ref 11.5–15.5)
WBC: 10.1 K/uL (ref 4.0–10.5)
nRBC: 0 % (ref 0.0–0.2)

## 2024-09-17 LAB — MAGNESIUM: Magnesium: 2.8 mg/dL — ABNORMAL HIGH (ref 1.7–2.4)

## 2024-09-17 LAB — GLUCOSE, CAPILLARY: Glucose-Capillary: 130 mg/dL — ABNORMAL HIGH (ref 70–99)

## 2024-09-17 LAB — HEPARIN LEVEL (UNFRACTIONATED): Heparin Unfractionated: 0.46 [IU]/mL (ref 0.30–0.70)

## 2024-09-17 MED ORDER — SODIUM CHLORIDE 0.9 % IV SOLN: INTRAVENOUS | Status: AC

## 2024-09-17 NOTE — Progress Notes (Signed)
  PROGRESS NOTE    Edwin [name]  FMW:[id number] DOB: Sep 29, 1966 DOA: 09/08/2024 PCP: Liana Fish, NP  IC08A/IC08A-AA  LOS: 9 days   Brief hospital course:   Assessment & Plan: 58 year old male with history of GERD, depression, anxiety, hyperlipidemia, morbid obesity, who presents ED for chief concerns of falling episodes at group home.    Severe cervical and thoracic myeloradiculopathy  S/p C3-4 anterior cervical discectomy and fusion and T2-T3 right sided transpedicular decompression with laminectomy, T1-T4 posterior spinal instrumentation and fusion on 09/12/24 --drain management per neurosurgery --pain management --monitor for development of anterior cervical clot (signs such as changes in his voice, increased difficulty with swallowing, or deviation of his trachea.) --early intubation with ICU team if pt is noted to have any developing anterior cervical clot.  Acute PE and DVT --dx 1 day after spine surgery.  Significant clot burden.  Echo could not visualize right heart.  After discussing benefits and risk of anticoagulation with Dr. Clois and Dr. Tamea, decision was to continue full anticoagulation due to significant risk of worsening into saddle embolus if anticoagulation is held.   --Echo couldn't visualize right heart --received thrombectomy on 10/13 --cont heparin gtt  AKI CKD 3b --Cr went from 1.54 to 1.96 to 2.3, with tripling of BUN, likely due to dehydration. --cont MIVF  Obesity, Class III, BMI 40-49.9 (morbid obesity) (HCC) This complicates overall care and prognosis.    Falling episodes Abnormality of gait Suspect secondary to spinal stenosis Patient is not endorsing pain --definitive tx for spinal stenosis --PT/OT   Schizoaffective disorder (HCC) Home risperidone 3 mg nightly, Thioridazine 100 mg p.o. twice daily, escitalopram 10 mg daily  --cont home regimen   Hyperlipidemia --cont statin   Type 2 diabetes mellitus with stage 3a chronic  kidney disease, without long-term current use of insulin (HCC) --no need for BG checks   Sleep apnea in adult CPAP nightly ordered   Delusion (HCC) With autism  Allergy --cont claritin and eye drops, per pt and mother request   DVT prophylaxis: Nw:yzejmpw gtt Code Status: Full code  Family Communication: sister updated on the phone today Level of care: Stepdown Dispo:   The patient is from: group home Anticipated d/c is to: SNF rehab Anticipated d/c date is: to be determined by neurosurgery   Subjective and Interval History:  Per family, pt has been thirsty.  Cr trending up.  Started on IVF.   Objective: Vitals:   09/17/24 1607 09/17/24 1700 09/17/24 1800 09/17/24 1900  BP: 103/60 94/65 (!) 103/51 (!) 111/58  Pulse: (!) 105 (!) 106 (!) 107 (!) 103  Resp: 14 16 15 17   Temp: 98 F (36.7 C)     TempSrc: Oral     SpO2: 95% 97% 94% 93%  Weight:      Height:        Intake/Output Summary (Last 24 hours) at 09/17/2024 1922 Last data filed at 09/17/2024 1607 Gross per 24 hour  Intake 784.4 ml  Output 1030 ml  Net -245.6 ml   Filed Weights   09/09/24 0336 09/12/24 0835 09/14/24 1350  Weight: (!) 142.2 kg (!) 142.2 kg (!) 142.2 kg    Examination:   Constitutional: NAD CV: No cyanosis.   RESP: normal respiratory effort, on RA SKIN: warm, dry   Data Reviewed: I have personally reviewed labs and imaging studies  Time spent: 35 minutes   Ellouise Haber, MD [facility] If 7PM-7AM, please contact night-coverage 09/17/2024, 7:22 PM

## 2024-09-17 NOTE — Progress Notes (Signed)
 Progress Note    09/17/2024 3:08 PM 1 Day Post-Op  Subjective:  Edwin [name] is a 58 yo male now POD #1 from:  Procedure(s) Performed:             1.  Contrast injection right heart             2.  Mechanical thrombectomy using the penumbra CAT 12 catheter to the left upper and lower lobe pulmonary arteries in the right lower, middle, and upper lobe pulmonary arteries             3.  Selective catheter placement left lower and upper lobe pulmonary artery             4.  Selective catheter placement right lower lobe, middle lobe, and upper lobe pulmonary arteries  Patient is resting comfortably in ICU this morning. No respiratory distress noted and no tachycardia. Patient is not requiring supplemental oxygen.Patients right groin with dressing clean, dry and intact. No complaints overnight. Vitals all remain stable.    Vitals:   09/17/24 1202 09/17/24 1300  BP: 118/67   Pulse: (!) 107 (!) 108  Resp: 12 14  Temp: 98.6 F (37 C)   SpO2: 95% 92%   Physical Exam: Cardiac:  RRR, Normal S1, S2. No murmurs.  Lungs:  Clear on auscultation but diminished in the bases. Rhonchi when coughing.  Incisions:  Right groin puncture site with dressing clean dry and intact.  Extremities:  All extremities with palpable pulses and warm to touch.  Abdomen:  Positive bowel sounds throughout, Soft non tender and non distended Neurologic: AAOX1, patient is at his baseline.   CBC    Component Value Date/Time   WBC 10.1 09/17/2024 0446   RBC 3.23 (L) 09/17/2024 0446   HGB 9.1 (L) 09/17/2024 0446   HGB 14.4 04/19/2024 0713   HCT 27.3 (L) 09/17/2024 0446   HCT 45.3 04/19/2024 0713   PLT 211 09/17/2024 0446   PLT 191 04/19/2024 0713   MCV 84.5 09/17/2024 0446   MCV 86 04/19/2024 0713   MCH 28.2 09/17/2024 0446   MCHC 33.3 09/17/2024 0446   RDW 13.3 09/17/2024 0446   RDW 13.1 04/19/2024 0713   LYMPHSABS 0.8 09/13/2024 2114   LYMPHSABS 1.2 04/19/2024 0713   MONOABS 1.4 (H) 09/13/2024 2114    EOSABS 0.0 09/13/2024 2114   EOSABS 0.1 04/19/2024 0713   BASOSABS 0.0 09/13/2024 2114   BASOSABS 0.0 04/19/2024 0713    BMET    Component Value Date/Time   NA 140 09/17/2024 0446   NA 142 04/19/2024 0713   K 3.9 09/17/2024 0446   CL 102 09/17/2024 0446   CO2 24 09/17/2024 0446   GLUCOSE 163 (H) 09/17/2024 0446   BUN 70 (H) 09/17/2024 0446   BUN 14 04/19/2024 0713   CREATININE 2.30 (H) 09/17/2024 0446   CREATININE 1.58 (H) 11/20/2020 0902   CALCIUM 8.1 (L) 09/17/2024 0446   GFRNONAA 32 (L) 09/17/2024 0446   GFRNONAA 49 (L) 11/20/2020 0902   GFRAA 56 (L) 11/20/2020 0902    INR No results found for: INR   Intake/Output Summary (Last 24 hours) at 09/17/2024 1508 Last data filed at 09/17/2024 1213 Gross per 24 hour  Intake 784.4 ml  Output 1100 ml  Net -315.6 ml     Assessment/Plan:  58 y.o. male is s/p SEE ABOVE  1 Day Post-Op   PLAN Pain Medication PRN Sit up in bed today. Incentive spirometry q1 hour while awake.  Albuterol  Nebulizer's Q 4hrs PRN  Patient continues on heparin infusion.    DVT prophylaxis:  None Post Operative.    Edwin [name] [facility] 09/17/2024 3:08 PM

## 2024-09-17 NOTE — Progress Notes (Signed)
 Attending Progress Note  History: Edwin [name] is here for progressive failure to thrive.  He is 58 year old man with rapidly progressing cervical and thoracic myeloradiculopathy.  He is been having progressive weakness numbness and tingling his bilateral upper and lower extremities.  Has had progressive difficulty with ambulation.  Has went from being ambulatory independent to requiring a rollator.  He feels that his legs do not listen to him anymore.  He was scheduled for a anterior cervical discectomy and fusion and posterior thoracic decompression and fusion, however he became too difficult to manage at home and was admitted to the hospital this past weekend.  POD5:  pt underwent thrombectomy yesterday. States he is ok this morning.  POD4: Pt states he is doing well this morning.  tachycardic on monitor.  POD3: No new events POD2: Dx with pulmonary emboli last night.  Started on anticoagulation due to extensive clot burden.  POD1: Had pain overnight  Physical Exam: Vitals:   09/17/24 0600 09/17/24 0700  BP: 99/63   Pulse: (!) 110 (!) 109  Resp: 15 16  Temp:    SpO2: 93% 98%    AA O CNI  Strength:   5/5 BUE. At least 3/5 throughout BLE   HV output documented as 200 yesterday and 0 overnight.   Data:  Recent Labs  Lab 09/14/24 0254 09/16/24 0303 09/17/24 0446  NA 139 139 140  K 3.8 4.2 3.9  CL 106 106 102  CO2 25 22 24   BUN 23* 51* 70*  CREATININE 1.54* 1.96* 2.30*  GLUCOSE 110* 149* 163*  CALCIUM 8.2* 8.3* 8.1*   Recent Labs  Lab 09/13/24 2114  AST 29  ALT 19  ALKPHOS 81     Recent Labs  Lab 09/15/24 0626 09/16/24 0303 09/17/24 0446  WBC 11.4* 11.4* 10.1  HGB 12.4* 11.1* 9.1*  HCT 37.5* 34.5* 27.3*  PLT 136* 186 211   No results for input(s): APTT, INR in the last 168 hours.       Assessment/Plan:  Edwin [name] is a 58 year old gentleman with some developmental delay who was experiencing a progressive loss of ambulatory function and  coordination in his upper extremities.  He was found to have severe stenosis in the cervical spine as well as thoracic spine consistent with cervical and thoracic myeloradiculopathy.   - No significant output since drain exchange yesterday. Will continue through this session of PT and remove if output remains low.  - early intubation with ICU team if he is noted to have any developing anterior cervical clot   Edsel Goods PA-C Department of Neurosurgery

## 2024-09-17 NOTE — Evaluation (Signed)
 Physical Therapy Re-Evaluation Patient Details Name: Edwin [name] MRN: [id number] DOB: March 06, 1966 Today's Date: 09/17/2024  History of Present Illness  Pt is a 58 y/o M admitted on 09/08/24 after presenting with c/o falls at his group home. Pt with severe cervical & thoracic myeloradiculopathy. Pt is s/p C3-4 anterior cervical discectomy & fusion, T3 R sided transpedicular decompression with laminectomy, T1-4 posterior spinal instrumentation & fusion 09/12/24. PMH: GERD, depression, anxiety, HLD, morbid obesity, Autism spectrum disorder, CKD, DM2. Patient developed Symptomatic bilateral pulmonary emboli s/p mechanical thrombectomy.   Clinical Impression  Re-evaluation performed s/p thrombectomy for pulmonary embolus. Patient required significant assistance with all mobility today. He was able to sit edge of bed for around 7 minutes with encouragement with poor balance requiring + 2 person assistance required. Unable to attempt standing due to poor activity tolerance, generalized weakness, poor balance. He appears fatigued with minimal activity. Consider rehabilitation < 3 hours/day at this time. PT will continue to follow.       If plan is discharge home, recommend the following: Two people to help with walking and/or transfers;Two people to help with bathing/dressing/bathroom;Assistance with cooking/housework;Assistance with feeding;Direct supervision/assist for medications management;Direct supervision/assist for financial management;Help with stairs or ramp for entrance;Assist for transportation;Supervision due to cognitive status   Can travel by private vehicle   No    Equipment Recommendations Other (comment) (TBD)  Recommendations for Other Services       Functional Status Assessment Patient has had a recent decline in their functional status and demonstrates the ability to make significant improvements in function in a reasonable and predictable amount of time.     Precautions /  Restrictions Precautions Precautions: Fall;Back;Cervical Precaution Booklet Issued: No Recall of Precautions/Restrictions: Impaired Restrictions Weight Bearing Restrictions Per Provider Order: No      Mobility  Bed Mobility Overal bed mobility: Needs Assistance Bed Mobility: Rolling, Sidelying to Sit, Sit to Sidelying Rolling: Total assist, +2 for physical assistance Sidelying to sit: Total assist, +2 for physical assistance     Sit to sidelying: Total assist, +2 for physical assistance General bed mobility comments: limited participation provided from the patient with basic mobility efforts. multi modal cues required    Transfers                   General transfer comment: unsafe to attempt due to poor sitting balance, limited sitting tolerance, generalized weakness, and fatigue with minimal activity    Ambulation/Gait               General Gait Details: unable to stand at this time  Stairs            Wheelchair Mobility     Tilt Bed    Modified Rankin (Stroke Patients Only)       Balance Overall balance assessment: Needs assistance Sitting-balance support: Feet supported, No upper extremity supported Sitting balance-Leahy Scale: Zero Sitting balance - Comments: +2 person assistance required. mostly Max + 2 with brief preiods of Min A+2. loss of balance in all directions                                     Pertinent Vitals/Pain Pain Assessment Pain Assessment: No/denies pain    Home Living Family/patient expects to be discharged to:: Group home Living Arrangements: Group Home  Prior Function Prior Level of Function : Needs assist             Mobility Comments: MOD I using rollator unbtil last ~week reporting increased falls ADLs Comments: pt has required increased assistance with ADLs recently     Extremity/Trunk Assessment   Upper Extremity Assessment Upper Extremity Assessment:  Generalized weakness    Lower Extremity Assessment Lower Extremity Assessment: Generalized weakness       Communication   Communication Communication: Impaired Factors Affecting Communication: Difficulty expressing self    Cognition Arousal: Alert Behavior During Therapy: Flat affect   PT - Cognitive impairments: History of cognitive impairments                       PT - Cognition Comments: patient responds with 1-2 word answers for basic questions. baseline developmental delay Following commands: Impaired Following commands impaired: Follows one step commands inconsistently (multi modal cues required)     Cueing Cueing Techniques: Verbal cues, Gestural cues, Tactile cues, Visual cues     General Comments General comments (skin integrity, edema, etc.): vitals stable throughout session    Exercises     Assessment/Plan    PT Assessment Patient needs continued PT services  PT Problem List Decreased strength;Decreased range of motion;Decreased activity tolerance;Decreased balance;Decreased mobility;Decreased cognition;Decreased safety awareness;Decreased knowledge of use of DME;Decreased knowledge of precautions       PT Treatment Interventions DME instruction;Gait training;Functional mobility training;Therapeutic activities;Therapeutic exercise;Balance training;Neuromuscular re-education;Cognitive remediation;Patient/family education;Wheelchair mobility training    PT Goals (Current goals can be found in the Care Plan section)  Acute Rehab PT Goals Patient Stated Goal: patient does not participate with goal setting PT Goal Formulation: Patient unable to participate in goal setting Time For Goal Achievement: 10/01/24 Potential to Achieve Goals: Poor    Frequency Min 3X/week     Co-evaluation               AM-PAC PT 6 Clicks Mobility  Outcome Measure Help needed turning from your back to your side while in a flat bed without using bedrails?:  Total Help needed moving from lying on your back to sitting on the side of a flat bed without using bedrails?: Total Help needed moving to and from a bed to a chair (including a wheelchair)?: Total Help needed standing up from a chair using your arms (e.g., wheelchair or bedside chair)?: Total Help needed to walk in hospital room?: Total Help needed climbing 3-5 steps with a railing? : Total 6 Click Score: 6    End of Session   Activity Tolerance: Patient limited by fatigue Patient left: in bed;with call bell/phone within reach;with family/visitor present Nurse Communication: Mobility status PT Visit Diagnosis: Unsteadiness on feet (R26.81);Muscle weakness (generalized) (M62.81)    Time: 9062-9045 PT Time Calculation (min) (ACUTE ONLY): 17 min   Charges:   PT Evaluation $PT Re-evaluation: 1 Re-eval   PT General Charges $$ ACUTE PT VISIT: 1 Visit        Randine Essex, PT, MPT   Randine LULLA Essex 09/17/2024, 11:39 AM

## 2024-09-17 NOTE — Evaluation (Signed)
 Occupational Therapy Re-Evaluation Patient Details Name: Edwin [name] MRN: [id number] DOB: 07-27-1966 Today's Date: 09/17/2024   History of Present Illness   Pt is a 58 y/o M admitted on 09/08/24 after presenting with c/o falls at his group home. Pt with severe cervical & thoracic myeloradiculopathy. Pt is s/p C3-4 anterior cervical discectomy & fusion, T3 R sided transpedicular decompression with laminectomy, T1-4 posterior spinal instrumentation & fusion 09/12/24. PMH: GERD, depression, anxiety, HLD, morbid obesity, Autism spectrum disorder, CKD, DM2. Patient developed Symptomatic bilateral pulmonary emboli s/p mechanical thrombectomy.     Clinical Impressions Upon entering the room, pt supine in bed with supportive family present in room. Pt is agreeable to co- session with PT and OT. Pt needing total A of 2 for bed mobility to EOB. He tolerates sitting EOB for ~ 7 minutes with mod - max +2 assistance for static sititng balance. Pt washes face with set up A and fatigues quickly. Pt returning to supine with total A of 2 for log roll to return to bed. Call bell and all needed items within reach.      If plan is discharge home, recommend the following:   Two people to help with walking and/or transfers;A lot of help with bathing/dressing/bathroom;Two people to help with bathing/dressing/bathroom;Assistance with cooking/housework;Direct supervision/assist for medications management;Direct supervision/assist for financial management;Assist for transportation;Help with stairs or ramp for entrance;Supervision due to cognitive status      Equipment Recommendations   Other (comment) (defer to next venue of care)      Precautions/Restrictions   Precautions Precautions: Fall;Back;Cervical Recall of Precautions/Restrictions: Impaired Restrictions Weight Bearing Restrictions Per Provider Order: No     Mobility Bed Mobility Overal bed mobility: Needs Assistance Bed Mobility: Rolling,  Sidelying to Sit, Sit to Sidelying Rolling: Total assist, +2 for physical assistance Sidelying to sit: Total assist, +2 for physical assistance     Sit to sidelying: Total assist, +2 for physical assistance General bed mobility comments: limited participation provided from the patient with basic mobility efforts. multi modal cues required    Transfers                   General transfer comment: unsafe to attempt due to poor sitting balance, limited sitting tolerance, generalized weakness, and fatigue with minimal activity      Balance Overall balance assessment: Needs assistance Sitting-balance support: Feet supported, No upper extremity supported Sitting balance-Leahy Scale: Zero Sitting balance - Comments: +2 person assistance required. mostly Max + 2 with brief preiods of Min A+2. loss of balance in all directions                                   ADL either performed or assessed with clinical judgement   ADL Overall ADL's : Needs assistance/impaired     Grooming: Wash/dry face;Sitting;Wash/dry hands;Set up;Minimal assistance                                       Vision Patient Visual Report: No change from baseline              Pertinent Vitals/Pain Pain Assessment Pain Assessment: No/denies pain     Extremity/Trunk Assessment Upper Extremity Assessment Upper Extremity Assessment: Generalized weakness   Lower Extremity Assessment Lower Extremity Assessment: Generalized weakness       Communication Communication Communication:  Impaired Factors Affecting Communication: Difficulty expressing self   Cognition Arousal: Alert Behavior During Therapy: Flat affect Cognition: History of cognitive impairments             OT - Cognition Comments: delayed processing but answers questions appropriately. Pt with developmental delay at baseline.                 Following commands: Impaired Following commands  impaired: Follows one step commands inconsistently     Cueing  General Comments   Cueing Techniques: Verbal cues;Gestural cues;Tactile cues;Visual cues  vitals stable throughout session           Home Living Family/patient expects to be discharged to:: Group home Living Arrangements: Group Home                                      Prior Functioning/Environment Prior Level of Function : Needs assist             Mobility Comments: MOD I using rollator unbtil last ~week reporting increased falls ADLs Comments: pt has required increased assistance with ADLs recently            OT Goals(Current goals can be found in the care plan section)   Acute Rehab OT Goals Time For Goal Achievement: 10/01/24 ADL Goals Pt Will Perform Upper Body Dressing: with supervision;sitting Pt Will Transfer to Toilet: with contact guard assist;bedside commode Pt Will Perform Toileting - Clothing Manipulation and hygiene: with contact guard assist;sit to/from stand   OT Frequency:  Min 2X/week    Co-evaluation PT/OT/SLP Co-Evaluation/Treatment: Yes Reason for Co-Treatment: For patient/therapist safety;To address functional/ADL transfers PT goals addressed during session: Mobility/safety with mobility;Balance OT goals addressed during session: ADL's and self-care      AM-PAC OT 6 Clicks Daily Activity     Outcome Measure Help from another person eating meals?: None Help from another person taking care of personal grooming?: A Little Help from another person toileting, which includes using toliet, bedpan, or urinal?: A Lot Help from another person bathing (including washing, rinsing, drying)?: A Lot Help from another person to put on and taking off regular upper body clothing?: A Lot Help from another person to put on and taking off regular lower body clothing?: Total 6 Click Score: 14   End of Session Equipment Utilized During Treatment: Rolling walker (2  wheels) Nurse Communication: Mobility status  Activity Tolerance: Patient limited by fatigue Patient left: with call bell/phone within reach;with family/visitor present;in bed;with bed alarm set  OT Visit Diagnosis: Other abnormalities of gait and mobility (R26.89);Muscle weakness (generalized) (M62.81)                Time: 9062-9045 OT Time Calculation (min): 17 min Charges:  OT General Charges $OT Visit: 1 Visit OT Evaluation $OT Re-eval: 1 Re-eval  Izetta Claude, MS, OTR/L , CBIS ascom [phone]  09/17/24, 2:50 PM

## 2024-09-17 NOTE — Progress Notes (Signed)
 PHARMACY - ANTICOAGULATION CONSULT NOTE  Pharmacy Consult for Heparin  Indication: pulmonary embolus  Allergies  Allergen Reactions   Citrullus Vulgaris Hives    Patient Measurements: Height: 6' 2 (188 cm) Weight: (!) 142.2 kg (313 lb 7.9 oz) IBW/kg (Calculated) : 82.2 HEPARIN DW (KG): 114.6  Vital Signs: Temp: 98 F (36.7 C) (10/14 0200) Temp Source: Oral (10/14 0200) BP: 106/70 (10/14 0400) Pulse Rate: 111 (10/14 0400)  Labs: Recent Labs    09/15/24 0626 09/15/24 1358 09/15/24 2027 09/16/24 0303 09/17/24 0446  HGB 12.4*  --   --  11.1* 9.1*  HCT 37.5*  --   --  34.5* 27.3*  PLT 136*  --   --  186 211  HEPARINUNFRC 0.59   < > 0.37 0.31 0.46  CREATININE  --   --   --  1.96* 2.30*   < > = values in this interval not displayed.    Estimated Creatinine Clearance: 51.9 mL/min (A) (by C-G formula based on SCr of 2.3 mg/dL (H)).   Medical History: Past Medical History:  Diagnosis Date   Diabetes mellitus without complication (HCC)    Hypercholesteremia    Medical history non-contributory     Medications:  Medications Prior to Admission  Medication Sig Dispense Refill Last Dose/Taking   acetaminophen  (TYLENOL ) 500 MG tablet Take 1,000 mg by mouth 2 (two) times daily as needed for moderate pain (pain score 4-6).   Unknown   Amantadine  HCl 100 MG tablet Take 1 tablet (100 mg total) by mouth 2 (two) times daily. 60 tablet 11 09/08/2024 at  8:00 AM   clonazePAM (KLONOPIN) 0.5 MG tablet Take 0.5 mg by mouth at bedtime as needed for anxiety.   Unknown   docusate sodium  (COLACE) 100 MG capsule Take 1 capsule (100 mg total) by mouth 3 (three) times daily. 90 capsule 11 09/08/2024 at  8:00 AM   empagliflozin  (JARDIANCE ) 10 MG TABS tablet Take 1 tablet (10 mg total) by mouth daily. 30 tablet 11 09/08/2024 at  8:00 AM   escitalopram (LEXAPRO) 10 MG tablet Take 10 mg by mouth daily.   09/08/2024 at  8:00 AM   HYDROcodone -acetaminophen  (NORCO/VICODIN) 5-325 MG tablet Take 1 tablet  by mouth 3 (three) times daily as needed for moderate pain (pain score 4-6) or severe pain (pain score 7-10). 90 tablet 0 09/08/2024 at  8:00 AM   meloxicam  (MOBIC ) 15 MG tablet TAKE 1 TABLET BY MOUTH ONCE DAILY FOR ARTHRITIS FLAIR/PAIN (TAKE WITH FOOD) AS NEEDED 30 tablet 5 09/07/2024   omeprazole  (PRILOSEC) 20 MG capsule TAKE (1) CAPSULE BY MOUTH ONCE DAILY. 30 capsule 6 09/08/2024 at  8:00 AM   risperiDONE (RISPERDAL) 3 MG tablet Take 3 mg by mouth at bedtime.   09/07/2024 at  8:00 PM   simvastatin  (ZOCOR ) 20 MG tablet Take 1 tablet (20 mg total) by mouth daily. 30 tablet 6 09/08/2024 at  8:00 AM   sodium fluoride (PREVIDENT) 1.1 % GEL dental gel Place 1 Application onto teeth at bedtime.   09/07/2024   tamsulosin  (FLOMAX ) 0.4 MG CAPS capsule Take 1 capsule (0.4 mg total) by mouth daily. 90 capsule 3 09/08/2024 at  8:00 AM   thioridazine (MELLARIL) 100 MG tablet TAKE 1 TABLET BY MOUTH TWICE DAILY 60 tablet 11 09/08/2024 at  8:00 AM    Assessment: Pharmacy consulted to dose heparin for PE in this 58 year old male.  No prior anticoag noted.  Pt had recent surgery so MD requests using lower  goal HL , will use 0.3 - 0.5.  10/11 0733 HL 0.11  10/11 1435 HL 0.93 10/11 2300 HL 0.63, elevated  10/12 0626 HL 0.59, elevated  10/12 1358 HL 0.4, therapeutic x1 10/12 2027 HL 0.37, therapeutic x2 10/13 0303 HL 0.31, therapeutic X 3 10/14 0446 HL 0.46, therapeutic x 4   Goal of Therapy:  Heparin level 0.3 - 0.5 units/ml Monitor platelets by anticoagulation protocol: Yes   Plan:  - Continue heparin infusion rate at 1600 units/hr - recheck HL on 10/15 with AM labs  - CBC daily while on heparin.   Rankin CANDIE Dills, PharmD, [facility] 09/17/2024 5:55 AM

## 2024-09-18 ENCOUNTER — Inpatient Hospital Stay

## 2024-09-18 DIAGNOSIS — R296 Repeated falls: Secondary | ICD-10-CM | POA: Diagnosis not present

## 2024-09-18 LAB — HEMOGLOBIN: Hemoglobin: 7.7 g/dL — ABNORMAL LOW (ref 13.0–17.0)

## 2024-09-18 LAB — BASIC METABOLIC PANEL WITH GFR
Anion gap: 10 (ref 5–15)
BUN: 57 mg/dL — ABNORMAL HIGH (ref 6–20)
CO2: 25 mmol/L (ref 22–32)
Calcium: 7.7 mg/dL — ABNORMAL LOW (ref 8.9–10.3)
Chloride: 102 mmol/L (ref 98–111)
Creatinine, Ser: 2.02 mg/dL — ABNORMAL HIGH (ref 0.61–1.24)
GFR, Estimated: 37 mL/min — ABNORMAL LOW (ref >60)
Glucose, Bld: 130 mg/dL — ABNORMAL HIGH (ref 70–99)
Potassium: 3.7 mmol/L (ref 3.5–5.1)
Sodium: 137 mmol/L (ref 135–145)

## 2024-09-18 LAB — CBC
HCT: 24.8 % — ABNORMAL LOW (ref 39.0–52.0)
Hemoglobin: 8.2 g/dL — ABNORMAL LOW (ref 13.0–17.0)
MCH: 28.3 pg (ref 26.0–34.0)
MCHC: 33.1 g/dL (ref 30.0–36.0)
MCV: 85.5 fL (ref 80.0–100.0)
Platelets: 159 K/uL (ref 150–400)
RBC: 2.9 MIL/uL — ABNORMAL LOW (ref 4.22–5.81)
RDW: 13.3 % (ref 11.5–15.5)
WBC: 10.2 K/uL (ref 4.0–10.5)
nRBC: 0.2 % (ref 0.0–0.2)

## 2024-09-18 LAB — HEPARIN LEVEL (UNFRACTIONATED): Heparin Unfractionated: 0.39 [IU]/mL (ref 0.30–0.70)

## 2024-09-18 MED ORDER — SODIUM CHLORIDE 0.9 % IV SOLN: INTRAVENOUS | Status: DC

## 2024-09-18 NOTE — Progress Notes (Signed)
 Attending Progress Note  History: Edwin [name] is here for progressive failure to thrive.  He is 58 year old man with rapidly progressing cervical and thoracic myeloradiculopathy.  He is been having progressive weakness numbness and tingling his bilateral upper and lower extremities.  Has had progressive difficulty with ambulation.  Has went from being ambulatory independent to requiring a rollator.  He feels that his legs do not listen to him anymore.  He was scheduled for a anterior cervical discectomy and fusion and posterior thoracic decompression and fusion, however he became too difficult to manage at home and was admitted to the hospital this past weekend.  POD6: No complaints this morning  POD5:  pt underwent thrombectomy yesterday. States he is ok this morning.  POD4: Pt states he is doing well this morning.  tachycardic on monitor.  POD3: No new events POD2: Dx with pulmonary emboli last night.  Started on anticoagulation due to extensive clot burden.  POD1: Had pain overnight  Physical Exam: Vitals:   09/18/24 0200 09/18/24 0400  BP:  120/78  Pulse: (!) 108 (!) 107  Resp: 16 18  Temp: 99.3 F (37.4 C)   SpO2: 92% 93%    AA O CNI  Strength:   5/5 BUE. At least 3/5 throughout BLE   Data:  Recent Labs  Lab 09/16/24 0303 09/17/24 0446 09/18/24 0516  NA 139 140 137  K 4.2 3.9 3.7  CL 106 102 102  CO2 22 24 25   BUN 51* 70* 57*  CREATININE 1.96* 2.30* 2.02*  GLUCOSE 149* 163* 130*  CALCIUM 8.3* 8.1* 7.7*   Recent Labs  Lab 09/13/24 2114  AST 29  ALT 19  ALKPHOS 81     Recent Labs  Lab 09/16/24 0303 09/17/24 0446 09/18/24 0516  WBC 11.4* 10.1 10.2  HGB 11.1* 9.1* 8.2*  HCT 34.5* 27.3* 24.8*  PLT 186 211 159   No results for input(s): APTT, INR in the last 168 hours.       Assessment/Plan:  Edwin [name] is a 58 year old gentleman with some developmental delay who was experiencing a progressive loss of ambulatory function and coordination  in his upper extremities.  He was found to have severe stenosis in the cervical spine as well as thoracic spine consistent with cervical and thoracic myeloradiculopathy.   - HV removed 10/14 - early intubation with ICU team if he is noted to have any developing anterior cervical clot - Ok from neurosurgical standpoint for transition to floor   Edsel Goods PA-C Department of Neurosurgery

## 2024-09-18 NOTE — Progress Notes (Signed)
 PHARMACY - ANTICOAGULATION CONSULT NOTE  Pharmacy Consult for Heparin  Indication: pulmonary embolus  Allergies  Allergen Reactions   Citrullus Vulgaris Hives    Patient Measurements: Height: 6' 2 (188 cm) Weight: (!) 142.2 kg (313 lb 7.9 oz) IBW/kg (Calculated) : 82.2 HEPARIN DW (KG): 114.6  Vital Signs: Temp: 99.3 F (37.4 C) (10/15 0200) Temp Source: Axillary (10/15 0200) BP: 120/78 (10/15 0400) Pulse Rate: 107 (10/15 0400)  Labs: Recent Labs    09/16/24 0303 09/17/24 0446 09/18/24 0516  HGB 11.1* 9.1* 8.2*  HCT 34.5* 27.3* 24.8*  PLT 186 211 159  HEPARINUNFRC 0.31 0.46 0.39  CREATININE 1.96* 2.30*  --     Estimated Creatinine Clearance: 51.9 mL/min (A) (by C-G formula based on SCr of 2.3 mg/dL (H)).   Medical History: Past Medical History:  Diagnosis Date   Diabetes mellitus without complication (HCC)    Hypercholesteremia    Medical history non-contributory     Medications:  Medications Prior to Admission  Medication Sig Dispense Refill Last Dose/Taking   acetaminophen  (TYLENOL ) 500 MG tablet Take 1,000 mg by mouth 2 (two) times daily as needed for moderate pain (pain score 4-6).   Unknown   Amantadine  HCl 100 MG tablet Take 1 tablet (100 mg total) by mouth 2 (two) times daily. 60 tablet 11 09/08/2024 at  8:00 AM   clonazePAM (KLONOPIN) 0.5 MG tablet Take 0.5 mg by mouth at bedtime as needed for anxiety.   Unknown   docusate sodium  (COLACE) 100 MG capsule Take 1 capsule (100 mg total) by mouth 3 (three) times daily. 90 capsule 11 09/08/2024 at  8:00 AM   empagliflozin  (JARDIANCE ) 10 MG TABS tablet Take 1 tablet (10 mg total) by mouth daily. 30 tablet 11 09/08/2024 at  8:00 AM   escitalopram (LEXAPRO) 10 MG tablet Take 10 mg by mouth daily.   09/08/2024 at  8:00 AM   HYDROcodone -acetaminophen  (NORCO/VICODIN) 5-325 MG tablet Take 1 tablet by mouth 3 (three) times daily as needed for moderate pain (pain score 4-6) or severe pain (pain score 7-10). 90 tablet 0  09/08/2024 at  8:00 AM   meloxicam  (MOBIC ) 15 MG tablet TAKE 1 TABLET BY MOUTH ONCE DAILY FOR ARTHRITIS FLAIR/PAIN (TAKE WITH FOOD) AS NEEDED 30 tablet 5 09/07/2024   omeprazole  (PRILOSEC) 20 MG capsule TAKE (1) CAPSULE BY MOUTH ONCE DAILY. 30 capsule 6 09/08/2024 at  8:00 AM   risperiDONE (RISPERDAL) 3 MG tablet Take 3 mg by mouth at bedtime.   09/07/2024 at  8:00 PM   simvastatin  (ZOCOR ) 20 MG tablet Take 1 tablet (20 mg total) by mouth daily. 30 tablet 6 09/08/2024 at  8:00 AM   sodium fluoride (PREVIDENT) 1.1 % GEL dental gel Place 1 Application onto teeth at bedtime.   09/07/2024   tamsulosin  (FLOMAX ) 0.4 MG CAPS capsule Take 1 capsule (0.4 mg total) by mouth daily. 90 capsule 3 09/08/2024 at  8:00 AM   thioridazine (MELLARIL) 100 MG tablet TAKE 1 TABLET BY MOUTH TWICE DAILY 60 tablet 11 09/08/2024 at  8:00 AM    Assessment: Pharmacy consulted to dose heparin for PE in this 58 year old male.  No prior anticoag noted.  Pt had recent surgery so MD requests using lower goal HL , will use 0.3 - 0.5.  10/11 0733 HL 0.11  10/11 1435 HL 0.93 10/11 2300 HL 0.63, elevated  10/12 0626 HL 0.59, elevated  10/12 1358 HL 0.4, therapeutic x1 10/12 2027 HL 0.37, therapeutic x2 10/13 0303  HL 0.31, therapeutic X 3 10/14 0446 HL 0.46, therapeutic x 4 10/15 0516 HL 0.39, therapeutic x 5   Goal of Therapy:  Heparin level 0.3 - 0.5 units/ml Monitor platelets by anticoagulation protocol: Yes   Plan:  - Continue heparin infusion rate at 1600 units/hr - recheck HL on 10/16 with AM labs  - CBC daily while on heparin.   Rankin CANDIE Dills, PharmD, [facility] 09/18/2024 6:07 AM

## 2024-09-18 NOTE — Plan of Care (Signed)
  Problem: Clinical Measurements: Goal: Will remain free from infection Outcome: Progressing Goal: Diagnostic test results will improve Outcome: Progressing Goal: Respiratory complications will improve Outcome: Progressing   Problem: Coping: Goal: Level of anxiety will decrease Outcome: Progressing   Problem: Elimination: Goal: Will not experience complications related to urinary retention Outcome: Progressing   Problem: Pain Managment: Goal: General experience of comfort will improve and/or be controlled Outcome: Progressing   Problem: Safety: Goal: Ability to remain free from injury will improve Outcome: Progressing   Problem: Skin Integrity: Goal: Risk for impaired skin integrity will decrease Outcome: Progressing   Problem: Fluid Volume: Goal: Ability to maintain a balanced intake and output will improve Outcome: Progressing   Problem: Tissue Perfusion: Goal: Adequacy of tissue perfusion will improve Outcome: Progressing

## 2024-09-18 NOTE — Progress Notes (Addendum)
 PROGRESS NOTE    ZEKI BEDROSIAN  FMW:[id number] DOB: June 12, 1966 DOA: 09/08/2024 PCP: Liana Fish, NP  [id number]/[id number]-[id number]  LOS: 10 days   HPI  All Mr. Connar Keating is a 58 year old male with history of GERD, depression, anxiety, hyperlipidemia, morbid obesity, who presents ED for chief concerns of falling episodes at group home.   At bedside, patient able to tell me his first, middle, last name.  He states the year is 2024.  He was able to tell me his age accurately.  And identify his sister at bedside accurately.   He was not able to tell me if he is hurting anywhere.  He asked me if he can sometimes eat candy.   Sister reports that he was complaining of right sided groin pain and on palpation, I was not able to elicit any pain reaction.     Assessment & Plan: 58 year old male with history of GERD, depression, anxiety, hyperlipidemia, morbid obesity, who presents ED for chief concerns of falling episodes at group home.    Severe cervical and thoracic myeloradiculopathy  S/p C3-4 anterior cervical discectomy and fusion and T2-T3 right sided transpedicular decompression with laminectomy, T1-T4 posterior spinal instrumentation and fusion on 09/12/24 --drain management per neurosurgery --pain management --monitor for development of anterior cervical clot (signs such as changes in his voice, increased difficulty with swallowing, or deviation of his trachea.) --early intubation with ICU team if pt is noted to have any developing anterior cervical clot.  Acute PE and DVT --dx 1 day after spine surgery.  Significant clot burden.  Echo could not visualize right heart.  After discussing benefits and risk of anticoagulation with Dr. Clois and Dr. Tamea, decision was to continue full anticoagulation due to significant risk of worsening into saddle embolus if anticoagulation is held.   --Echo couldn't visualize right heart --received thrombectomy on 10/13 --cont heparin gtt  Anemia Hgb  has been downtrending for several days. No melana, hematochezia, or other bleeding. EBL from thrombectomy on 10/13 was 375 and EBL from spine surgery on 10/9 was 600. Per neurosurg, drain output was also significant for a while, so likely a decent component of blood loss there as well (drain has been removed). Thus suspect this is 2/2 surgical blood loss. However patient is on heparin and is autistic so communicating problems is a challenge for him, and hgb has dropped from 8s this morning to 7s this afternoon - check ct c/a/p to eval for occult bleeding  AKI CKD 3b --Cr went from 1.54 to 1.96 to 2.3, with tripling of BUN, likely due to dehydration. Improved today to 2.02 --cont IVF for one more day  Obesity, Class III, BMI 40-49.9 (morbid obesity) (HCC) This complicates overall care and prognosis.    Falling episodes Abnormality of gait Suspect secondary to spinal stenosis Patient is not endorsing pain --definitive tx for spinal stenosis --PT advising SNF, TOC consulted for this. Per TOC, patient has a SNF bed [facility]) when medically stable for d/c   Schizoaffective disorder (HCC) Home risperidone 3 mg nightly, Thioridazine 100 mg p.o. twice daily, escitalopram 10 mg daily  --cont home regimen   Hyperlipidemia --cont statin   Type 2 diabetes mellitus with stage 3a chronic kidney disease, without long-term current use of insulin (HCC) --no need for BG checks   Sleep apnea in adult CPAP nightly ordered   Delusion (HCC) With autism  Allergy --cont claritin and eye drops, per pt and mother request   DVT prophylaxis: Nw:yzejmpw gtt Code Status:  Full code  Family Communication: sister updated at bedside 10/15 Level of care: Stepdown Dispo:   The patient is from: group home Anticipated d/c is to: SNF rehab Anticipated d/c date is: TBD   Subjective and Interval History:  Tolerating diet, no complaints, no BM today   Objective: Vitals:   09/18/24 1000 09/18/24 1100  09/18/24 1114 09/18/24 1200  BP:    120/81  Pulse: (!) 108 (!) 106  (!) 106  Resp: 11 12  16   Temp:   98.7 F (37.1 C)   TempSrc:   Oral   SpO2: 96% 97%  95%  Weight:      Height:        Intake/Output Summary (Last 24 hours) at 09/18/2024 1512 Last data filed at 09/18/2024 1400 Gross per 24 hour  Intake 2047.28 ml  Output 1600 ml  Net 447.28 ml   Filed Weights   09/09/24 0336 09/12/24 0835 09/14/24 1350  Weight: (!) 142.2 kg (!) 142.2 kg (!) 142.2 kg    Examination:   Constitutional: NAD CV: No cyanosis.   RESP: normal respiratory effort, on RA SKIN: warm, dry   Data Reviewed: I have personally reviewed labs and imaging studies   Devaughn KATHEE Ban, MD [facility] If 7PM-7AM, please contact night-coverage 09/18/2024, 3:12 PM

## 2024-09-18 NOTE — Progress Notes (Signed)
 Physical Therapy Treatment Patient Details Name: Edwin [name] MRN: [id number] DOB: 1966-06-21 Today's Date: 09/18/2024   History of Present Illness Pt is a 58 y/o M admitted on 09/08/24 after presenting with c/o falls at his group home. Pt with severe cervical & thoracic myeloradiculopathy. Pt is s/p C3-4 anterior cervical discectomy & fusion, T3 R sided transpedicular decompression with laminectomy, T1-4 posterior spinal instrumentation & fusion 09/12/24. PMH: GERD, depression, anxiety, HLD, morbid obesity, Autism spectrum disorder, CKD, DM2. Patient developed Symptomatic bilateral pulmonary emboli s/p mechanical thrombectomy.    PT Comments  Pt alert and agreeable to participate in PT tx this date. Pt was met semi-supine in bed, required maxAx2 to roll into sidelying with pt initiating with RUE reaching to bed rail, max multimodal cues for sequencing. Pt transferred sidelying <> sit with totalAx2, very limited participation in transfer. Pt maintained sitting at EOB for ~5 minutes during session, initially required heavy assist to maintain upright, with cues for RUE support on bed rail pt was better able to maintain midline. Pt was left semi-supine in bed at end of session, all needs in reach, mother at bedside. The patient would benefit from further skilled PT intervention to continue to progress towards goals.    If plan is discharge home, recommend the following: Two people to help with walking and/or transfers;Two people to help with bathing/dressing/bathroom;Assistance with cooking/housework;Assistance with feeding;Direct supervision/assist for medications management;Direct supervision/assist for financial management;Help with stairs or ramp for entrance;Assist for transportation;Supervision due to cognitive status   Can travel by private vehicle     No  Equipment Recommendations  Other (comment) (TBD at next venue of care)    Recommendations for Other Services       Precautions /  Restrictions Precautions Precautions: Fall;Back;Cervical Precaution Booklet Issued: No Recall of Precautions/Restrictions: Impaired Restrictions Weight Bearing Restrictions Per Provider Order: No     Mobility  Bed Mobility Overal bed mobility: Needs Assistance Bed Mobility: Rolling, Sidelying to Sit, Sit to Sidelying Rolling: Max assist, +2 for physical assistance, Used rails Sidelying to sit: Total assist, +2 for physical assistance, HOB elevated, Used rails     Sit to sidelying: Total assist, +2 for physical assistance General bed mobility comments: Pt able to initiate rolling with RUE with max multimodal cues, limited participation in sidelying <> sit transfers    Transfers Overall transfer level: Needs assistance                Lateral/Scoot Transfers: Max assist, +2 physical assistance General transfer comment: attempted to have pt laterally scoot up toward HOB, limited ability to weight shift to offload hips despite maxAx2    Ambulation/Gait                   Stairs             Wheelchair Mobility     Tilt Bed    Modified Rankin (Stroke Patients Only)       Balance Overall balance assessment: Needs assistance Sitting-balance support: Feet supported Sitting balance-Leahy Scale: Poor Sitting balance - Comments: initially requires heavy assistance to maintain upright, with RUE bed rail support pt is better able to maintain midline. Poor upright tolerance                                    Communication Communication Communication: Impaired Factors Affecting Communication: Difficulty expressing self  Cognition Arousal: Alert Behavior During Therapy: [facility]  for tasks assessed/performed   PT - Cognitive impairments: History of cognitive impairments                       PT - Cognition Comments: baseline developmental delay, requires encouragement to participate in mobility Following commands: Impaired Following  commands impaired: Follows one step commands inconsistently    Cueing Cueing Techniques: Verbal cues, Tactile cues, Visual cues  Exercises      General Comments        Pertinent Vitals/Pain Pain Assessment Pain Assessment: Faces Faces Pain Scale: Hurts a little bit Pain Location: neck Pain Descriptors / Indicators: Grimacing Pain Intervention(s): Limited activity within patient's tolerance, Monitored during session, Repositioned    Home Living                          Prior Function            PT Goals (current goals can now be found in the care plan section) Progress towards PT goals: Progressing toward goals    Frequency    Min 3X/week      PT Plan      Co-evaluation              AM-PAC PT 6 Clicks Mobility   Outcome Measure  Help needed turning from your back to your side while in a flat bed without using bedrails?: A Lot Help needed moving from lying on your back to sitting on the side of a flat bed without using bedrails?: Total Help needed moving to and from a bed to a chair (including a wheelchair)?: Total Help needed standing up from a chair using your arms (e.g., wheelchair or bedside chair)?: Total Help needed to walk in hospital room?: Total Help needed climbing 3-5 steps with a railing? : Total 6 Click Score: 7    End of Session   Activity Tolerance: Patient limited by fatigue Patient left: in bed;with call bell/phone within reach;with bed alarm set;with family/visitor present Nurse Communication: Mobility status PT Visit Diagnosis: Unsteadiness on feet (R26.81);Muscle weakness (generalized) (M62.81) Pain - part of body:  (neck, back)     Time: 9140-9082 PT Time Calculation (min) (ACUTE ONLY): 18 min  Charges:    $Therapeutic Activity: 8-22 mins PT General Charges $$ ACUTE PT VISIT: 1 Visit                     Janell Axe, SPT

## 2024-09-19 ENCOUNTER — Other Ambulatory Visit (HOSPITAL_COMMUNITY): Payer: Self-pay

## 2024-09-19 ENCOUNTER — Telehealth (HOSPITAL_COMMUNITY): Payer: Self-pay

## 2024-09-19 DIAGNOSIS — M5020 Other cervical disc displacement, unspecified cervical region: Secondary | ICD-10-CM

## 2024-09-19 DIAGNOSIS — G549 Nerve root and plexus disorder, unspecified: Secondary | ICD-10-CM

## 2024-09-19 DIAGNOSIS — T148XXA Other injury of unspecified body region, initial encounter: Secondary | ICD-10-CM | POA: Diagnosis not present

## 2024-09-19 DIAGNOSIS — R296 Repeated falls: Secondary | ICD-10-CM | POA: Diagnosis not present

## 2024-09-19 LAB — BASIC METABOLIC PANEL WITH GFR
Anion gap: 9 (ref 5–15)
BUN: 52 mg/dL — ABNORMAL HIGH (ref 6–20)
CO2: 25 mmol/L (ref 22–32)
Calcium: 7.9 mg/dL — ABNORMAL LOW (ref 8.9–10.3)
Chloride: 105 mmol/L (ref 98–111)
Creatinine, Ser: 1.83 mg/dL — ABNORMAL HIGH (ref 0.61–1.24)
GFR, Estimated: 42 mL/min — ABNORMAL LOW (ref >60)
Glucose, Bld: 128 mg/dL — ABNORMAL HIGH (ref 70–99)
Potassium: 3.6 mmol/L (ref 3.5–5.1)
Sodium: 139 mmol/L (ref 135–145)

## 2024-09-19 LAB — CBC
HCT: 23.6 % — ABNORMAL LOW (ref 39.0–52.0)
Hemoglobin: 7.7 g/dL — ABNORMAL LOW (ref 13.0–17.0)
MCH: 28.2 pg (ref 26.0–34.0)
MCHC: 32.6 g/dL (ref 30.0–36.0)
MCV: 86.4 fL (ref 80.0–100.0)
Platelets: 130 K/uL — ABNORMAL LOW (ref 150–400)
RBC: 2.73 MIL/uL — ABNORMAL LOW (ref 4.22–5.81)
RDW: 13.3 % (ref 11.5–15.5)
WBC: 10.4 K/uL (ref 4.0–10.5)
nRBC: 0.5 % — ABNORMAL HIGH (ref 0.0–0.2)

## 2024-09-19 LAB — HEPARIN LEVEL (UNFRACTIONATED): Heparin Unfractionated: 0.46 [IU]/mL (ref 0.30–0.70)

## 2024-09-19 MED ORDER — APIXABAN 5 MG PO TABS
10.0000 mg | ORAL_TABLET | Freq: Two times a day (BID) | ORAL | Status: AC
Start: 2024-09-19 — End: 2024-09-21
  Administered 2024-09-19 – 2024-09-20 (×4): 10 mg via ORAL
  Filled 2024-09-19 (×4): qty 2

## 2024-09-19 MED ORDER — SENNA 8.6 MG PO TABS
1.0000 | ORAL_TABLET | Freq: Every day | ORAL | Status: DC
  Administered 2024-09-19 – 2024-09-24 (×6): 8.6 mg via ORAL
  Filled 2024-09-19 (×6): qty 1

## 2024-09-19 MED ORDER — APIXABAN 5 MG PO TABS
5.0000 mg | ORAL_TABLET | Freq: Two times a day (BID) | ORAL | Status: DC
  Administered 2024-09-21 – 2024-09-24 (×7): 5 mg via ORAL
  Filled 2024-09-19 (×8): qty 1

## 2024-09-19 MED ORDER — POLYETHYLENE GLYCOL 3350 17 G PO PACK
17.0000 g | PACK | Freq: Two times a day (BID) | ORAL | Status: DC
  Administered 2024-09-19 – 2024-09-24 (×9): 17 g via ORAL
  Filled 2024-09-19 (×10): qty 1

## 2024-09-19 NOTE — Progress Notes (Signed)
 PROGRESS NOTE Edwin [name]  FMW:[id number] DOB: Dec 02, 1966 DOA: 09/08/2024 PCP: Liana Fish, NP  [id number]/[id number]-[id number]  LOS: 11 days   HPI Edwin [name] is a 58 year old male with history of GERD, depression, anxiety, hyperlipidemia, morbid obesity, who presents ED for chief concerns of falling episodes, progressive failure to thrive at group home. He was scheduled for a anterior cervical discectomy and fusion and posterior thoracic decompression and fusion, however he became too difficult to manage at home and was admitted to the hospital this past weekend.   ED course: VSS. Imaging concerning for rapidly progressing cervical and thoracic myeloradiculopathy   09/19/24- patient is recovering as expected post-op at this time. Wound healing well. PT/OT eval recommending SNF which he is medically cleared to go to once placement finalized. Changed from heparin gtt to eliquis today. No further procedures planned at this time.  Assessment & Plan: Severe cervical and thoracic myeloradiculopathy  S/p C3-4 anterior cervical discectomy and fusion and T2-T3 right sided transpedicular decompression with laminectomy, T1-T4 posterior spinal instrumentation and fusion on 09/12/24. He denies pain today.  -- neurosurgery following, appreciate your recs - wound vac removed 10/14 --pain management - PT/OT- recommending SNF  Acute PE and DVT--dx 1 day after spine surgery.  Significant clot burden.  Echo could not visualize right heart.  After discussing benefits and risk of anticoagulation with Dr. Clois and Dr. Tamea, decision was to continue full anticoagulation due to significant risk of worsening into saddle embolus if anticoagulation is held.   --Echo couldn't visualize right heart --s/p thrombectomy on 10/13 --has been therapeutic on heparin gtt x5 days without complication and no further procedures planned at this time. Transition to eliquis and complete 7 days of treatment dose  Anemia   hematoma Hgb has been downtrending for several days. No melana, hematochezia, or other bleeding. EBL from thrombectomy on 10/13 was 375 and EBL from spine surgery on 10/9 was 600. Per neurosurg, drain output was also significant for a while, so likely a decent component of blood loss there as well (drain has been removed). Thus suspect this is 2/2 surgical blood loss. Large hematoma on CT scan 10/15 13.9cm at widest diameter in posterior paraspinal tissues.  Hgb stable at 7.7. continue to monitor CBC - re-image hematoma if rapid decrease in hgb or symptoms of growth  AKI CKD 3b- Cr returning close to baseline at 1.83 today  Obesity, Class III, BMI 40-49.9 (morbid obesity) (HCC) This complicates overall care and prognosis.    Falling episodes Abnormality of gait Suspect secondary to spinal stenosis Patient is not endorsing pain --definitive tx for spinal stenosis --PT advising SNF, TOC consulted for this. Bed at [facility] no longer available.    Schizoaffective disorder (HCC) Home risperidone 3 mg nightly, Thioridazine 100 mg p.o. twice daily, escitalopram 10 mg daily  --cont home regimen   Hyperlipidemia --cont statin   Type 2 diabetes mellitus with stage 3a chronic kidney disease, without long-term current use of insulin (HCC) --no need for BG checks   Sleep apnea in adult CPAP nightly ordered   Delusion (HCC) With autism  Allergy --cont claritin and eye drops, per pt and mother request  DVT prophylaxis: Nw:yzejmpw gtt Code Status: Full code  Family Communication: sister, mother, caregiver updated at bedside Level of care: Stepdown Dispo:   The patient is from: group home Anticipated d/c is to: SNF rehab Anticipated d/c date is: TBD  Subjective and Interval History:  Tolerating diet, no complaints.  Objective: Vitals:   09/19/24 0000  09/19/24 0200 09/19/24 0300 09/19/24 0400  BP: 116/73   118/72  Pulse: (!) 103 (!) 104 (!) 103 (!) 101  Resp: 16 17 13 15   Temp:   98.9 F (37.2 C)    TempSrc:  Axillary    SpO2: 95% 98% 97% 95%  Weight:      Height:        Intake/Output Summary (Last 24 hours) at 09/19/2024 0827 Last data filed at 09/19/2024 0600 Gross per 24 hour  Intake 1861.1 ml  Output 1300 ml  Net 561.1 ml   Filed Weights   09/09/24 0336 09/12/24 0835 09/14/24 1350  Weight: (!) 142.2 kg (!) 142.2 kg (!) 142.2 kg    Examination:   Constitutional: NAD CV: No cyanosis.   RESP: normal respiratory effort, on RA SKIN: warm, dry  Data Reviewed: I have personally reviewed labs and imaging studies   Edwin LITTIE Piety, MD [facility] If 7PM-7AM, please contact night-coverage 09/19/2024, 8:27 AM

## 2024-09-19 NOTE — Progress Notes (Addendum)
 Attending Progress Note  History: Edwin [name] is here for progressive failure to thrive.  He is 58 year old man with rapidly progressing cervical and thoracic myeloradiculopathy.  He is been having progressive weakness numbness and tingling his bilateral upper and lower extremities.  Has had progressive difficulty with ambulation.  Has went from being ambulatory independent to requiring a rollator.  He feels that his legs do not listen to him anymore.  He was scheduled for a anterior cervical discectomy and fusion and posterior thoracic decompression and fusion, however he became too difficult to manage at home and was admitted to the hospital this past weekend.  POD7: No complaints this morning. Reports sensation in his legs. Is denying pain.  POD6: No complaints this morning  POD5:  pt underwent thrombectomy yesterday. States he is ok this morning.  POD4: Pt states he is doing well this morning.  tachycardic on monitor.  POD3: No new events POD2: Dx with pulmonary emboli last night.  Started on anticoagulation due to extensive clot burden.  POD1: Had pain overnight  Physical Exam: Vitals:   09/19/24 0300 09/19/24 0400  BP:  118/72  Pulse: (!) 103 (!) 101  Resp: 13 15  Temp:    SpO2: 97% 95%    Resting comfortably this morning. Awake to participate in neuro exam.  CNI  Strength:   5/5 BUE. At least 3/5 throughout BLE   Anterior incision with dermabond in place. Without significant swelling or drainage  Data:  09/18/2024 CT C/A/P IMPRESSION: 1. New complex 13.9 x 9.2 x 5.4 cm (volume 359 cc) mixed density mass-like lesion in the posterior paraspinal tissues, favoring hematoma. 2. Lumbar spondylosis and degenerative disc disease with impingement at L3-4, L4-5, and L5-S1. 3. Degenerative glenohumeral arthropathy laterally. 4. Bridging anterior interbody spurring at C6-C7-T1-T2 and multiple additional thoracic levels. 5. Bridging spurring of the sacroiliac joints.     Recent Labs  Lab 09/17/24 0446 09/18/24 0516 09/19/24 0325  NA 140 137 139  K 3.9 3.7 3.6  CL 102 102 105  CO2 24 25 25   BUN 70* 57* 52*  CREATININE 2.30* 2.02* 1.83*  GLUCOSE 163* 130* 128*  CALCIUM 8.1* 7.7* 7.9*   Recent Labs  Lab 09/13/24 2114  AST 29  ALT 19  ALKPHOS 81     Recent Labs  Lab 09/17/24 0446 09/18/24 0516 09/18/24 1533 09/19/24 0325  WBC 10.1 10.2  --  10.4  HGB 9.1* 8.2*   < > 7.7*  HCT 27.3* 24.8*  --  23.6*  PLT 211 159  --  130*   < > = values in this interval not displayed.   No results for input(s): APTT, INR in the last 168 hours.       Assessment/Plan:  Edwin [name] is a 58 year old gentleman with some developmental delay who was experiencing a progressive loss of ambulatory function and coordination in his upper extremities.  He was found to have severe stenosis in the cervical spine as well as thoracic spine consistent with cervical and thoracic myeloradiculopathy.   CT on 10/15 concerning for hematoma in post-operative cavity. Neuro exam is reassuring.   - HV removed 10/14 - Ok from neurosurgical standpoint for transition to floor  - No plans for evacuation of hematoma so long as incision remains dry and patients neurologic exam remains stable.   Edsel Goods PA-C Department of Neurosurgery

## 2024-09-19 NOTE — Progress Notes (Signed)
 Physical Therapy Treatment Patient Details Name: Edwin [name] MRN: [id number] DOB: [birth year] Today's Date: 09/19/2024   History of Present Illness Pt is a 58 y/o M admitted on 09/08/24 after presenting with c/o falls at his group home. Pt with severe cervical & thoracic myeloradiculopathy. Pt is s/p C3-4 anterior cervical discectomy & fusion, T3 R sided transpedicular decompression with laminectomy, T1-4 posterior spinal instrumentation & fusion 09/12/24. PMH: GERD, depression, anxiety, HLD, morbid obesity, Autism spectrum disorder, CKD, DM2. Patient developed Symptomatic bilateral pulmonary emboli s/p mechanical thrombectomy.    PT Comments  Patient is agreeable to PT session. Supportive sister at the bedside. He continues to require +2 person assistance for bed mobility. Standing initiated with +2 person assistance with some weight bearing through BLE. Unable to fully stand. Generalized weakness throughout with poor sitting balance. Activity tolerance limited by fatigue. Recommend to continue to PT to maximize independence.    If plan is discharge home, recommend the following: Two people to help with walking and/or transfers;Two people to help with bathing/dressing/bathroom;Assistance with cooking/housework;Assistance with feeding;Direct supervision/assist for medications management;Direct supervision/assist for financial management;Help with stairs or ramp for entrance;Assist for transportation;Supervision due to cognitive status   Can travel by private vehicle     No  Equipment Recommendations   (TBD)    Recommendations for Other Services       Precautions / Restrictions Precautions Precautions: Fall;Back;Cervical Precaution Booklet Issued: No Recall of Precautions/Restrictions: Impaired Restrictions Weight Bearing Restrictions Per Provider Order: No     Mobility  Bed Mobility Overal bed mobility: Needs Assistance Bed Mobility: Rolling, Sidelying to Sit, Sit to  Sidelying Rolling: Max assist, +2 for physical assistance Sidelying to sit: Total assist, +2 for physical assistance     Sit to sidelying: Total assist, +2 for physical assistance General bed mobility comments: significant assistance required for bed mobility. cues for sequencing of logroll technique.    Transfers Overall transfer level: Needs assistance Equipment used: 2 person hand held assist Transfers: Sit to/from Stand Sit to Stand: Max assist, +2 physical assistance          Lateral/Scoot Transfers: Max assist, +2 physical assistance General transfer comment: patient able to bear weight through BLE with anterior weight shifting. unable to fully stand. Max A +2 for lateral scooting to the left with cues for technique    Ambulation/Gait               General Gait Details: unable to fully stand   Stairs             Wheelchair Mobility     Tilt Bed    Modified Rankin (Stroke Patients Only)       Balance Overall balance assessment: Needs assistance Sitting-balance support: Feet supported Sitting balance-Leahy Scale: Poor Sitting balance - Comments: significant assistance required intermittently due to anterior loss of balance. cues and faciliation required for midline                                    Communication Communication Communication: Impaired Factors Affecting Communication: Difficulty expressing self  Cognition Arousal: Alert Behavior During Therapy: WFL for tasks assessed/performed   PT - Cognitive impairments: History of cognitive impairments                       PT - Cognition Comments: baseline developmental delay. increased time required Following commands: Impaired  Cueing Cueing Techniques: Verbal cues, Tactile cues, Gestural cues, Visual cues  Exercises      General Comments General comments (skin integrity, edema, etc.): supportive sister at the bedside      Pertinent Vitals/Pain Pain  Assessment Pain Assessment: Faces Faces Pain Scale: Hurts a little bit Pain Location: back Pain Descriptors / Indicators: Grimacing Pain Intervention(s): Monitored during session, Limited activity within patient's tolerance, Repositioned    Home Living                          Prior Function            PT Goals (current goals can now be found in the care plan section) Acute Rehab PT Goals Patient Stated Goal: patient does not participate with goal setting PT Goal Formulation: Patient unable to participate in goal setting Time For Goal Achievement: 10/01/24 Potential to Achieve Goals: Poor Progress towards PT goals: Progressing toward goals    Frequency           PT Plan      Co-evaluation PT/OT/SLP Co-Evaluation/Treatment: Yes Reason for Co-Treatment: For patient/therapist safety;To address functional/ADL transfers PT goals addressed during session: Mobility/safety with mobility        AM-PAC PT 6 Clicks Mobility   Outcome Measure  Help needed turning from your back to your side while in a flat bed without using bedrails?: Total Help needed moving from lying on your back to sitting on the side of a flat bed without using bedrails?: Total Help needed moving to and from a bed to a chair (including a wheelchair)?: Total Help needed standing up from a chair using your arms (e.g., wheelchair or bedside chair)?: Total Help needed to walk in hospital room?: Total Help needed climbing 3-5 steps with a railing? : Total 6 Click Score: 6    End of Session   Activity Tolerance: Patient limited by fatigue Patient left: in bed;with call bell/phone within reach;with family/visitor present Nurse Communication: Mobility status PT Visit Diagnosis: Unsteadiness on feet (R26.81);Muscle weakness (generalized) (M62.81)     Time: 8899-8876 PT Time Calculation (min) (ACUTE ONLY): 23 min  Charges:    $Therapeutic Activity: 8-22 mins PT General Charges $$ ACUTE  PT VISIT: 1 Visit                     Randine Essex, PT, MPT    Randine LULLA Essex 09/19/2024, 12:52 PM

## 2024-09-19 NOTE — TOC Progression Note (Signed)
 Transition of Care [facility]) - Progression Note    Patient Details  Name: Edwin [name] MRN: [id number] Date of Birth: 06-Feb-1966  [facility] CM/SW Contact  Corrie JINNY Ruts, LCSW Phone Number: 09/19/2024, 1:37 PM  Clinical Narrative:    Chart reviewed. I spoke with the Rep at [facility] and she reports that she will send the patient photo to her director for approval.  I spoke with [name] at bedside and she confirmed that the patient will return to his group home after placement at [facility]. I was present when she was speaking with the rep.   Waitng for placement approval from [place] [facility].   Expected Discharge Plan: Skilled Nursing Facility Barriers to Discharge: Continued Medical Work up               Expected Discharge Plan and Services   Discharge Planning Services: CM Consult Post Acute Care Choice: Skilled Nursing Facility Living arrangements for the past 2 months: Group Home                                       Social Drivers of Health (SDOH) Interventions SDOH Screenings   Food Insecurity: No Food Insecurity (09/09/2024)  Housing: Low Risk  (09/09/2024)  Transportation Needs: No Transportation Needs (09/09/2024)  Utilities: Not At Risk (09/09/2024)  Alcohol Screen: Low Risk  (12/01/2021)  Depression (PHQ2-9): Low Risk  (05/20/2024)  Financial Resource Strain: Low Risk  (05/08/2024)   Received from [facility]  Physical Activity: Insufficiently Active (12/01/2021)  Social Connections: Moderately Integrated (12/01/2021)  Stress: No Stress Concern Present (12/01/2021)  Tobacco Use: Low Risk  (09/12/2024)    Readmission Risk Interventions     No data to display

## 2024-09-19 NOTE — TOC Initial Note (Signed)
 Transition of Care [facility]) - Initial/Assessment Note    Patient Details  Name: Edwin [name] MRN: [id number] Date of Birth: 10-17-1966  Transition of Care [facility]) CM/SW Contact:    Corrie JINNY Ruts, LCSW Phone Number: 09/19/2024, 11:30 AM  Clinical Narrative:                 Chart reviewed. The patient was admitted for falling episodes. I spoke to the patient sister at bedside. The patient sister, [name] Bergeron, whom makes medical decisions for the patient. I introduced myself, my role, and reason for consult. The patient sister reports that the patient has a PCP. The patient sister reports that the patient came from [facility] group home but will not be returning.   The patient sister reports that the patient was highly functioning within the group home. The patient sister reports that the patient care giver, Tod Fries would transport the patient to medical appointments. The patient sister reports that the patient gets his medications delivered. The patient reports that the patient has never had HH or been admitted into a SNF in the past. The patient reports that the patient does not have any equipment.   The patient sister reports that she would like the patient to be admitted into [facility] instead of [facility].   I spoke with Daril Gander and he confirmed that [facility] would not be able to meet patient needs. I will follow up with the patient sister about placement.   There are no other TOC needs at this time.   Expected Discharge Plan: Skilled Nursing Facility Barriers to Discharge: Continued Medical Work up   Patient Goals and CMS Choice     Choice offered to / list presented to : Parent      Expected Discharge Plan and Services   Discharge Planning Services: CM Consult Post Acute Care Choice: Skilled Nursing Facility Living arrangements for the past 2 months: Group Home                                      Prior Living  Arrangements/Services Living arrangements for the past 2 months: Group Home Lives with:: Facility Resident Patient language and need for interpreter reviewed:: Yes        Need for Family Participation in Patient Care: Yes (Comment)     Criminal Activity/Legal Involvement Pertinent to Current Situation/Hospitalization: No - Comment as needed  Activities of Daily Living   ADL Screening (condition at time of admission) Independently performs ADLs?: Yes (appropriate for developmental age) Is the patient deaf or have difficulty hearing?: No Does the patient have difficulty seeing, even when wearing glasses/contacts?: No Does the patient have difficulty concentrating, remembering, or making decisions?: No  Permission Sought/Granted            Permission granted to share info w Relationship: Sister  Permission granted to share info w Contact Information: [name] Bergeron: [phone]  Emotional Assessment Appearance:: Appears stated age Attitude/Demeanor/Rapport: Unable to Assess Affect (typically observed): Unable to Assess     Psych Involvement: Outpatient Provider  Admission diagnosis:  Neurogenic claudication due to lumbar spinal stenosis [M48.062] Falling episodes [R29.6] Declining mobility [Z74.09] Fall in home, initial encounter [W19.CHERENE, Y92.009] Patient Active Problem List   Diagnosis Date Noted   Acute pulmonary embolism without acute cor pulmonale (HCC) 09/17/2024   Hyper reflexia 09/12/2024   Cervical myelopathy (HCC) 09/12/2024   Declining  mobility 09/12/2024   Falling episodes 09/08/2024   Obesity, Class III, BMI 40-49.9 (morbid obesity) (HCC) 09/08/2024   Cervical stenosis of spine 09/08/2024   Spondylosis, cervical, with myelopathy 09/04/2024   Cervical disc herniation 09/04/2024   Thoracic spondylosis with myelopathy 09/04/2024   Weakness of both lower extremities 09/04/2024   Lower extremity numbness 09/04/2024   Abnormality of gait 09/04/2024    Adenomatous polyp of colon 04/17/2024   Gastroesophageal reflux disease without esophagitis 11/23/2023   Schizoaffective disorder (HCC) 11/24/2022   Class 3 severe obesity with serious comorbidity and body mass index (BMI) of 40.0 to 44.9 in adult [facility]) 08/04/2022   Hyperlipidemia 08/04/2022   Type 2 diabetes mellitus with stage 3a chronic kidney disease, without long-term current use of insulin (HCC) 12/18/2020   Stage 3b chronic kidney disease (HCC) 11/20/2020   Colon cancer screening 11/20/2020   Moderate mixed hyperlipidemia not requiring statin therapy 08/21/2020   Obesity (BMI 35.0-39.9 without comorbidity) 05/13/2020   Delusion (HCC) 05/13/2020   Sleep apnea in adult 05/13/2020   PCP:  Liana Fish, NP Pharmacy:   [facility] - [place], [place] - [address] [place] [address] Phone: [phone] Fax: [phone]     Social Drivers of Health (SDOH) Social History: SDOH Screenings   Food Insecurity: No Food Insecurity (09/09/2024)  Housing: Low Risk  (09/09/2024)  Transportation Needs: No Transportation Needs (09/09/2024)  Utilities: Not At Risk (09/09/2024)  Alcohol Screen: Low Risk  (12/01/2021)  Depression (PHQ2-9): Low Risk  (05/20/2024)  Financial Resource Strain: Low Risk  (05/08/2024)   Received from [facility]  Physical Activity: Insufficiently Active (12/01/2021)  Social Connections: Moderately Integrated (12/01/2021)  Stress: No Stress Concern Present (12/01/2021)  Tobacco Use: Low Risk  (09/12/2024)   SDOH Interventions:     Readmission Risk Interventions     No data to display

## 2024-09-19 NOTE — Progress Notes (Signed)
 PHARMACY - ANTICOAGULATION CONSULT NOTE  Pharmacy Consult for apixaban transition from heparin  Indication: pulmonary embolus  Allergies  Allergen Reactions   Citrullus Vulgaris Hives    Patient Measurements: Height: 6' 2 (188 cm) Weight: (!) 142.2 kg (313 lb 7.9 oz) IBW/kg (Calculated) : 82.2 HEPARIN DW (KG): 114.6  Vital Signs: Temp: 98.9 F (37.2 C) (10/16 0200) Temp Source: Axillary (10/16 0200) BP: 118/72 (10/16 0400) Pulse Rate: 101 (10/16 0400)  Labs: Recent Labs    09/17/24 0446 09/18/24 0516 09/18/24 1533 09/19/24 0325  HGB 9.1* 8.2* 7.7* 7.7*  HCT 27.3* 24.8*  --  23.6*  PLT 211 159  --  130*  HEPARINUNFRC 0.46 0.39  --  0.46  CREATININE 2.30* 2.02*  --  1.83*    Estimated Creatinine Clearance: 65.3 mL/min (A) (by C-G formula based on SCr of 1.83 mg/dL (H)).   Medical History: Past Medical History:  Diagnosis Date   Diabetes mellitus without complication (HCC)    Hypercholesteremia    Medical history non-contributory     Medications:  Medications Prior to Admission  Medication Sig Dispense Refill Last Dose/Taking   acetaminophen  (TYLENOL ) 500 MG tablet Take 1,000 mg by mouth 2 (two) times daily as needed for moderate pain (pain score 4-6).   Unknown   Amantadine  HCl 100 MG tablet Take 1 tablet (100 mg total) by mouth 2 (two) times daily. 60 tablet 11 09/08/2024 at  8:00 AM   clonazePAM (KLONOPIN) 0.5 MG tablet Take 0.5 mg by mouth at bedtime as needed for anxiety.   Unknown   docusate sodium  (COLACE) 100 MG capsule Take 1 capsule (100 mg total) by mouth 3 (three) times daily. 90 capsule 11 09/08/2024 at  8:00 AM   empagliflozin  (JARDIANCE ) 10 MG TABS tablet Take 1 tablet (10 mg total) by mouth daily. 30 tablet 11 09/08/2024 at  8:00 AM   escitalopram (LEXAPRO) 10 MG tablet Take 10 mg by mouth daily.   09/08/2024 at  8:00 AM   HYDROcodone -acetaminophen  (NORCO/VICODIN) 5-325 MG tablet Take 1 tablet by mouth 3 (three) times daily as needed for moderate  pain (pain score 4-6) or severe pain (pain score 7-10). 90 tablet 0 09/08/2024 at  8:00 AM   meloxicam  (MOBIC ) 15 MG tablet TAKE 1 TABLET BY MOUTH ONCE DAILY FOR ARTHRITIS FLAIR/PAIN (TAKE WITH FOOD) AS NEEDED 30 tablet 5 09/07/2024   omeprazole  (PRILOSEC) 20 MG capsule TAKE (1) CAPSULE BY MOUTH ONCE DAILY. 30 capsule 6 09/08/2024 at  8:00 AM   risperiDONE (RISPERDAL) 3 MG tablet Take 3 mg by mouth at bedtime.   09/07/2024 at  8:00 PM   simvastatin  (ZOCOR ) 20 MG tablet Take 1 tablet (20 mg total) by mouth daily. 30 tablet 6 09/08/2024 at  8:00 AM   sodium fluoride (PREVIDENT) 1.1 % GEL dental gel Place 1 Application onto teeth at bedtime.   09/07/2024   tamsulosin  (FLOMAX ) 0.4 MG CAPS capsule Take 1 capsule (0.4 mg total) by mouth daily. 90 capsule 3 09/08/2024 at  8:00 AM   thioridazine (MELLARIL) 100 MG tablet TAKE 1 TABLET BY MOUTH TWICE DAILY 60 tablet 11 09/08/2024 at  8:00 AM    Assessment: 58 yom with PMH GERD, depression, anxiety, HLD, obesity, developmental delay. Presented with progressive failure to thrive and loss of ambulatory function requiring surgical intervention.  Pharmacy consulted to transition patient to apixaban from heparin. Pt developed PE with significant clot burden postoperatively.  Has received continuous heparin infusion from 10/11 to 10/16.   Goal  of Therapy:  Monitor platelets by anticoagulation protocol: Yes   Plan:  - Stop heparin infusion and administer first apixaban dose immediately after.  - Initiate apixaban 10mg  PO BID x 2 days, then 5mg  PO BID thereafter - per MD - CBC and Scr at least once every 7 days while on anticoagulation.   Bernardino George, PharmD Candidate [phone] [employer] 09/19/2024 8:51 AM

## 2024-09-19 NOTE — Telephone Encounter (Signed)
 Pharmacy Patient Advocate Encounter  Insurance verification completed.    The patient is insured through [employer].     Ran test claim for Eliquis and the current 30 day co-pay is $0.00.   This test claim was processed through [facility]- copay amounts may vary at other pharmacies due to pharmacy/plan contracts, or as the patient moves through the different stages of their insurance plan.

## 2024-09-19 NOTE — Progress Notes (Signed)
 PHARMACY - ANTICOAGULATION CONSULT NOTE  Pharmacy Consult for Heparin  Indication: pulmonary embolus  Allergies  Allergen Reactions   Citrullus Vulgaris Hives    Patient Measurements: Height: 6' 2 (188 cm) Weight: (!) 142.2 kg (313 lb 7.9 oz) IBW/kg (Calculated) : 82.2 HEPARIN DW (KG): 114.6  Vital Signs: Temp: 98.9 F (37.2 C) (10/16 0200) Temp Source: Axillary (10/16 0200) BP: 118/72 (10/16 0400) Pulse Rate: 101 (10/16 0400)  Labs: Recent Labs    09/17/24 0446 09/18/24 0516 09/18/24 1533 09/19/24 0325  HGB 9.1* 8.2* 7.7* 7.7*  HCT 27.3* 24.8*  --  23.6*  PLT 211 159  --  130*  HEPARINUNFRC 0.46 0.39  --  0.46  CREATININE 2.30* 2.02*  --  1.83*    Estimated Creatinine Clearance: 65.3 mL/min (A) (by C-G formula based on SCr of 1.83 mg/dL (H)).   Medical History: Past Medical History:  Diagnosis Date   Diabetes mellitus without complication (HCC)    Hypercholesteremia    Medical history non-contributory     Medications:  Medications Prior to Admission  Medication Sig Dispense Refill Last Dose/Taking   acetaminophen  (TYLENOL ) 500 MG tablet Take 1,000 mg by mouth 2 (two) times daily as needed for moderate pain (pain score 4-6).   Unknown   Amantadine  HCl 100 MG tablet Take 1 tablet (100 mg total) by mouth 2 (two) times daily. 60 tablet 11 09/08/2024 at  8:00 AM   clonazePAM (KLONOPIN) 0.5 MG tablet Take 0.5 mg by mouth at bedtime as needed for anxiety.   Unknown   docusate sodium  (COLACE) 100 MG capsule Take 1 capsule (100 mg total) by mouth 3 (three) times daily. 90 capsule 11 09/08/2024 at  8:00 AM   empagliflozin  (JARDIANCE ) 10 MG TABS tablet Take 1 tablet (10 mg total) by mouth daily. 30 tablet 11 09/08/2024 at  8:00 AM   escitalopram (LEXAPRO) 10 MG tablet Take 10 mg by mouth daily.   09/08/2024 at  8:00 AM   HYDROcodone -acetaminophen  (NORCO/VICODIN) 5-325 MG tablet Take 1 tablet by mouth 3 (three) times daily as needed for moderate pain (pain score 4-6) or  severe pain (pain score 7-10). 90 tablet 0 09/08/2024 at  8:00 AM   meloxicam  (MOBIC ) 15 MG tablet TAKE 1 TABLET BY MOUTH ONCE DAILY FOR ARTHRITIS FLAIR/PAIN (TAKE WITH FOOD) AS NEEDED 30 tablet 5 09/07/2024   omeprazole  (PRILOSEC) 20 MG capsule TAKE (1) CAPSULE BY MOUTH ONCE DAILY. 30 capsule 6 09/08/2024 at  8:00 AM   risperiDONE (RISPERDAL) 3 MG tablet Take 3 mg by mouth at bedtime.   09/07/2024 at  8:00 PM   simvastatin  (ZOCOR ) 20 MG tablet Take 1 tablet (20 mg total) by mouth daily. 30 tablet 6 09/08/2024 at  8:00 AM   sodium fluoride (PREVIDENT) 1.1 % GEL dental gel Place 1 Application onto teeth at bedtime.   09/07/2024   tamsulosin  (FLOMAX ) 0.4 MG CAPS capsule Take 1 capsule (0.4 mg total) by mouth daily. 90 capsule 3 09/08/2024 at  8:00 AM   thioridazine (MELLARIL) 100 MG tablet TAKE 1 TABLET BY MOUTH TWICE DAILY 60 tablet 11 09/08/2024 at  8:00 AM    Assessment: Pharmacy consulted to dose heparin for PE in this 58 year old male.  No prior anticoag noted.  Pt had recent surgery so MD requests using lower goal HL , will use 0.3 - 0.5.  10/11 0733 HL 0.11  10/11 1435 HL 0.93 10/11 2300 HL 0.63, elevated  10/12 0626 HL 0.59, elevated  10/12  1358 HL 0.4, therapeutic x1 10/12 2027 HL 0.37, therapeutic x2 10/13 0303 HL 0.31, therapeutic X 3 10/14 0446 HL 0.46, therapeutic x 4 10/15 0516 HL 0.39, therapeutic x 5 10/16 0325 HL 0.46, therapeutic x 6 / Hgb 7.7 / Plt 130   Goal of Therapy:  Heparin level 0.3 - 0.5 units/ml Monitor platelets by anticoagulation protocol: Yes   Plan:  - Continue heparin infusion rate at 1600 units/hr - recheck HL on 10/17 with AM labs  - CBC daily while on heparin.   Rankin CANDIE Dills, PharmD, MBA 09/19/2024 5:22 AM

## 2024-09-19 NOTE — Progress Notes (Signed)
 0800 Sleeping. Mother in room. Respirations regular. Awakens to voice.

## 2024-09-19 NOTE — TOC Progression Note (Signed)
 Transition of Care [facility]) - Progression Note    Patient Details  Name: Edwin [name] MRN: [id number] Date of Birth: Jun 27, 1966  [facility]) CM/SW Contact  Corrie JINNY Ruts, LCSW Phone Number: 09/19/2024, 2:54 PM  Clinical Narrative:    Chart reviewed. [facility] did not accept the patient. [facility] inquiring about bed availability. I have left a message for Tammy, waiting for a call back.    Expected Discharge Plan: Skilled Nursing Facility Barriers to Discharge: Continued Medical Work up               Expected Discharge Plan and Services   Discharge Planning Services: CM Consult Post Acute Care Choice: Skilled Nursing Facility Living arrangements for the past 2 months: Group Home                                       Social Drivers of Health (SDOH) Interventions SDOH Screenings   Food Insecurity: No Food Insecurity (09/09/2024)  Housing: Low Risk  (09/09/2024)  Transportation Needs: No Transportation Needs (09/09/2024)  Utilities: Not At Risk (09/09/2024)  Alcohol Screen: Low Risk  (12/01/2021)  Depression (PHQ2-9): Low Risk  (05/20/2024)  Financial Resource Strain: Low Risk  (05/08/2024)   Received from [facility]  Physical Activity: Insufficiently Active (12/01/2021)  Social Connections: Moderately Integrated (12/01/2021)  Stress: No Stress Concern Present (12/01/2021)  Tobacco Use: Low Risk  (09/12/2024)    Readmission Risk Interventions     No data to display

## 2024-09-19 NOTE — Progress Notes (Signed)
 Occupational Therapy Treatment Patient Details Name: Edwin [name] MRN: [id number] DOB: 08/04/1966 Today's Date: 09/19/2024   History of present illness Pt is a 58 y/o M admitted on 09/08/24 after presenting with c/o falls at his group home. Pt with severe cervical & thoracic myeloradiculopathy. Pt is s/p C3-4 anterior cervical discectomy & fusion, T3 R sided transpedicular decompression with laminectomy, T1-4 posterior spinal instrumentation & fusion 09/12/24. PMH: GERD, depression, anxiety, HLD, morbid obesity, Autism spectrum disorder, CKD, DM2. Patient developed Symptomatic bilateral pulmonary emboli s/p mechanical thrombectomy.   OT comments  Upon entering the room, pt supine in bed with supportive sister present in room. Pt is agreeable to co-treatment with PT and OT. Pt needing max A of 2 to log roll to EOB with total A of 2 for trunk and B LE support to come to EOB. Static sitting balance mod - max A with pt having anterior bias and needing cues to correct. Pt attempts to stand and clears buttocks from elevated bed surface with max A of 2 but unable to come to full stand. Max A of 2 for lateral scoot towards the L. Total A of 2 for sit >supine. Call bell and all needed items within reach upon exiting the room.       If plan is discharge home, recommend the following:  Two people to help with walking and/or transfers;A lot of help with bathing/dressing/bathroom;Two people to help with bathing/dressing/bathroom;Assistance with cooking/housework;Direct supervision/assist for medications management;Direct supervision/assist for financial management;Assist for transportation;Help with stairs or ramp for entrance;Supervision due to cognitive status   Equipment Recommendations  Other (comment) (defer to next venue of care)       Precautions / Restrictions Precautions Precautions: Fall;Back;Cervical       Mobility Bed Mobility Overal bed mobility: Needs Assistance Bed Mobility: Rolling,  Sidelying to Sit, Sit to Sidelying Rolling: Max assist, +2 for physical assistance Sidelying to sit: Total assist, +2 for physical assistance     Sit to sidelying: Total assist, +2 for physical assistance General bed mobility comments: significant assistance required for bed mobility. cues for sequencing of logroll technique.    Transfers Overall transfer level: Needs assistance Equipment used: 2 person hand held assist Transfers: Sit to/from Stand Sit to Stand: Max assist, +2 physical assistance          Lateral/Scoot Transfers: Max assist, +2 physical assistance General transfer comment: patient able to bear weight through BLE with anterior weight shifting. unable to fully stand. Max A +2 for lateral scooting to the left with cues for technique     Balance Overall balance assessment: Needs assistance Sitting-balance support: Feet supported Sitting balance-Leahy Scale: Poor Sitting balance - Comments: significant assistance required intermittently due to anterior loss of balance. cues and faciliation required for midline   Standing balance support: Bilateral upper extremity supported, Reliant on assistive device for balance, During functional activity Standing balance-Leahy Scale: Poor Standing balance comment: Mod A x2 at [facility] for static standing with multimodal cuing for upright standing posture.                           ADL either performed or assessed with clinical judgement    Extremity/Trunk Assessment Upper Extremity Assessment Upper Extremity Assessment: Generalized weakness   Lower Extremity Assessment Lower Extremity Assessment: Generalized weakness   Cervical / Trunk Assessment Cervical / Trunk Assessment: Neck Surgery;Back Surgery    Vision Patient Visual Report: No change from baseline  Communication Communication Communication: Impaired Factors Affecting Communication: Difficulty expressing self   Cognition Arousal:  Alert Behavior During Therapy: WFL for tasks assessed/performed Cognition: History of cognitive impairments                               Following commands: Impaired Following commands impaired: Follows one step commands inconsistently      Cueing   Cueing Techniques: Verbal cues, Tactile cues, Gestural cues, Visual cues        General Comments supportive sister at the bedside    Pertinent Vitals/ Pain       Pain Assessment Pain Assessment: Faces Faces Pain Scale: Hurts a little bit Pain Location: back Pain Descriptors / Indicators: Grimacing Pain Intervention(s): Monitored during session, Limited activity within patient's tolerance, Repositioned         Frequency  Min 2X/week        Progress Toward Goals  OT Goals(current goals can now be found in the care plan section)  Progress towards OT goals: Progressing toward goals         Co-evaluation      Reason for Co-Treatment: For patient/therapist safety;To address functional/ADL transfers PT goals addressed during session: Mobility/safety with mobility OT goals addressed during session: ADL's and self-care      AM-PAC OT 6 Clicks Daily Activity     Outcome Measure   Help from another person eating meals?: None Help from another person taking care of personal grooming?: A Little Help from another person toileting, which includes using toliet, bedpan, or urinal?: A Lot Help from another person bathing (including washing, rinsing, drying)?: A Lot Help from another person to put on and taking off regular upper body clothing?: A Lot Help from another person to put on and taking off regular lower body clothing?: Total 6 Click Score: 14    End of Session Equipment Utilized During Treatment: Rolling walker (2 wheels)  OT Visit Diagnosis: Other abnormalities of gait and mobility (R26.89);Muscle weakness (generalized) (M62.81)   Activity Tolerance Patient limited by fatigue   Patient Left  with call bell/phone within reach;with family/visitor present;in bed;with bed alarm set   Nurse Communication Mobility status        Time: 8899-8876 OT Time Calculation (min): 23 min  Charges: OT General Charges $OT Visit: 1 Visit OT Treatments $Therapeutic Activity: 8-22 mins  Izetta Claude, MS, OTR/L , CBIS ascom [phone]  09/19/24, 1:21 PM

## 2024-09-20 ENCOUNTER — Ambulatory Visit: Admitting: Nurse Practitioner

## 2024-09-20 DIAGNOSIS — I2692 Saddle embolus of pulmonary artery without acute cor pulmonale: Secondary | ICD-10-CM

## 2024-09-20 DIAGNOSIS — M4800 Spinal stenosis, site unspecified: Secondary | ICD-10-CM

## 2024-09-20 DIAGNOSIS — R296 Repeated falls: Secondary | ICD-10-CM | POA: Diagnosis not present

## 2024-09-20 LAB — CBC
HCT: 23.2 % — ABNORMAL LOW (ref 39.0–52.0)
Hemoglobin: 7.4 g/dL — ABNORMAL LOW (ref 13.0–17.0)
MCH: 27.7 pg (ref 26.0–34.0)
MCHC: 31.9 g/dL (ref 30.0–36.0)
MCV: 86.9 fL (ref 80.0–100.0)
Platelets: 149 K/uL — ABNORMAL LOW (ref 150–400)
RBC: 2.67 MIL/uL — ABNORMAL LOW (ref 4.22–5.81)
RDW: 13.4 % (ref 11.5–15.5)
WBC: 9.5 K/uL (ref 4.0–10.5)
nRBC: 0.7 % — ABNORMAL HIGH (ref 0.0–0.2)

## 2024-09-20 LAB — BASIC METABOLIC PANEL WITH GFR
Anion gap: 10 (ref 5–15)
BUN: 39 mg/dL — ABNORMAL HIGH (ref 6–20)
CO2: 24 mmol/L (ref 22–32)
Calcium: 8.1 mg/dL — ABNORMAL LOW (ref 8.9–10.3)
Chloride: 106 mmol/L (ref 98–111)
Creatinine, Ser: 1.6 mg/dL — ABNORMAL HIGH (ref 0.61–1.24)
GFR, Estimated: 49 mL/min — ABNORMAL LOW (ref >60)
Glucose, Bld: 110 mg/dL — ABNORMAL HIGH (ref 70–99)
Potassium: 3.4 mmol/L — ABNORMAL LOW (ref 3.5–5.1)
Sodium: 140 mmol/L (ref 135–145)

## 2024-09-20 LAB — HEPARIN LEVEL (UNFRACTIONATED): Heparin Unfractionated: >1.1 [IU]/mL — ABNORMAL HIGH (ref 0.30–0.70)

## 2024-09-20 MED ORDER — OXYCODONE HCL 5 MG PO TABS
5.0000 mg | ORAL_TABLET | ORAL | Status: DC | PRN
  Administered 2024-09-20: 5 mg via ORAL
  Filled 2024-09-20: qty 1

## 2024-09-20 MED ORDER — POTASSIUM CHLORIDE CRYS ER 20 MEQ PO TBCR
40.0000 meq | EXTENDED_RELEASE_TABLET | Freq: Two times a day (BID) | ORAL | Status: AC
  Administered 2024-09-20 (×2): 40 meq via ORAL
  Filled 2024-09-20 (×2): qty 2

## 2024-09-20 NOTE — Progress Notes (Addendum)
 PROGRESS NOTE Edwin [name]  FMW:[id number] DOB: Feb 15, 1966 DOA: 09/08/2024 PCP: Liana Fish, NP  IC08A/[id number]-AA  LOS: 12 days   HPI Edwin [name] is a 58 year old male with history of GERD, depression, anxiety, hyperlipidemia, morbid obesity, who presents ED for chief concerns of falling episodes, progressive failure to thrive at group home. He was scheduled for a anterior cervical discectomy and fusion and posterior thoracic decompression and fusion, however became too difficult to manage at home and was admitted to the hospital for intervention. ED course: VSS. Imaging concerning for rapidly progressing cervical and thoracic myeloradiculopathy  Neurosurgery consulted and performed C3-4 anterior cervical discectomy and fusion and T2-T3 right sided transpedicular decompression with laminectomy, T1-T4 posterior spinal instrumentation and fusion on 09/12/24  09/20/24- patient is recovering as expected post-op at this time with exception of PE requiring anticoagulation. Wound healing well. PT/OT eval recommending SNF which he is medically cleared to go to once placement finalized. Changed from heparin gtt to eliquis. No further procedures planned at this time. Hgb and neuro exam overall stable.   Assessment & Plan: Severe cervical and thoracic myeloradiculopathy  S/p C3-4 anterior cervical discectomy and fusion and T2-T3 right sided transpedicular decompression with laminectomy, T1-T4 posterior spinal instrumentation and fusion on 09/12/24. He denies pain today.  CT chest, abdomen, pelvis on 10/15- complex 13.9 x 9.2 x 5.4 cm (volume 359 cc) mixed density mass-like lesion in the posterior paraspinal tissues, favoring hematoma. -- neurosurgery following, appreciate your recs. Stable neuro exam so no monitoring imaging needed at this time.  - wound vac removed 10/14 --pain management - PT/OT- recommending SNF  Acute PE and DVT--dx 1 day after spine surgery.  Significant clot burden.  Echo could  not visualize right heart.  After discussing benefits and risk of anticoagulation with Dr. Clois and Dr. Tamea, decision was to continue full anticoagulation due to significant risk of worsening into saddle embolus if anticoagulation is held.   --Echo couldn't visualize right heart --s/p thrombectomy on 10/13 --has been therapeutic on heparin gtt x5 days without complication and no further procedures planned at this time. Transition to eliquis and complete 7 days of treatment dose followed by maintenance.  - monitor for neuro exam changes indicating growth of hematoma  Anemia  hematoma Hgb has been downtrending for several days. No melana, hematochezia, or other bleeding. EBL from thrombectomy on 10/13 was 375 and EBL from spine surgery on 10/9 was 600. Per neurosurg, drain output was also significant for a while, so likely a decent component of blood loss there as well (drain has been removed). Thus suspect this is 2/2 surgical blood loss. Large hematoma on CT scan 10/15 13.9cm at widest diameter in posterior paraspinal tissues.  Hgb stable at 7.7. continue to monitor CBC - re-image hematoma if rapid decrease in hgb or symptoms of growth  AKI CKD 3b- Cr returning close to baseline at 1.6 today  Mild hypokalemia- correct and monitor  Obesity, Class III, BMI 40-49.9 (morbid obesity) (HCC) This complicates overall care and prognosis.    Falling episodes Abnormality of gait Suspect secondary to spinal stenosis Patient is not endorsing pain --definitive tx for spinal stenosis --PT advising SNF, TOC consulted for this. Bed at [facility] no longer available.    Schizoaffective disorder (HCC) Home risperidone 3 mg nightly, Thioridazine 100 mg p.o. twice daily, escitalopram 10 mg daily  --cont home regimen   Hyperlipidemia --cont statin   Type 2 diabetes mellitus with stage 3a chronic kidney disease, without long-term current use of  insulin (HCC) --no need for BG checks   Sleep apnea  in adult CPAP nightly ordered   Delusion (HCC) With autism  Allergy --cont claritin and eye drops, per pt and mother request  DVT prophylaxis: eliquis Code Status: Full code  Family Communication: none at bedside Level of care: Stepdown Dispo:   The patient is from: group home Anticipated d/c is to: SNF rehab Anticipated d/c date is: TBD  Subjective and Interval History:  Tolerating diet, no complaints. Describes neck pain from straining to keep his head up while getting a bath.   Objective: Vitals:   09/19/24 1940 09/19/24 2000 09/20/24 0000 09/20/24 0400  BP: 121/69 126/75 122/68 132/83  Pulse: (!) 103 (!) 101 (!) 101 (!) 106  Resp: 14 14 15 15   Temp: 98 F (36.7 C)  98.8 F (37.1 C) 98.1 F (36.7 C)  TempSrc: Oral  Oral Oral  SpO2: 97% 97% 93% 94%  Weight:      Height:        Intake/Output Summary (Last 24 hours) at 09/20/2024 0821 Last data filed at 09/20/2024 0547 Gross per 24 hour  Intake 0 ml  Output 2000 ml  Net -2000 ml   Filed Weights   09/09/24 0336 09/12/24 0835 09/14/24 1350  Weight: (!) 142.2 kg (!) 142.2 kg (!) 142.2 kg    Examination:   Constitutional: NAD CV: No cyanosis.   RESP: normal respiratory effort, on RA SKIN: warm, dry. Surgical incisions healing well. Swelling of neck.   Data Reviewed: I have personally reviewed labs and imaging studies    Latest Ref Rng & Units 09/20/2024    4:12 AM 09/19/2024    3:25 AM 09/18/2024    3:33 PM  CBC  WBC 4.0 - 10.5 K/uL 9.5  10.4    Hemoglobin 13.0 - 17.0 g/dL 7.4  7.7  7.7   Hematocrit 39.0 - 52.0 % 23.2  23.6    Platelets 150 - 400 K/uL 149  130        Latest Ref Rng & Units 09/20/2024    4:12 AM 09/19/2024    3:25 AM 09/18/2024    5:16 AM  BMP  Glucose 70 - 99 mg/dL 889  871  869   BUN 6 - 20 mg/dL 39  52  57   Creatinine 0.61 - 1.24 mg/dL 8.39  8.16  7.97   Sodium 135 - 145 mmol/L 140  139  137   Potassium 3.5 - 5.1 mmol/L 3.4  3.6  3.7   Chloride 98 - 111 mmol/L 106  105   102   CO2 22 - 32 mmol/L 24  25  25    Calcium 8.9 - 10.3 mg/dL 8.1  7.9  7.7     Marien LITTIE Piety, MD [facility] If 7PM-7AM, please contact night-coverage 09/20/2024, 8:21 AM

## 2024-09-20 NOTE — Progress Notes (Signed)
 Occupational Therapy Treatment Patient Details Name: Edwin [name] MRN: [id number] DOB: Apr 17, 1966 Today's Date: 09/20/2024   History of present illness Pt is a 58 y/o M admitted on 09/08/24 after presenting with c/o falls at his group home. Pt with severe cervical & thoracic myeloradiculopathy. Pt is s/p C3-4 anterior cervical discectomy & fusion, T3 R sided transpedicular decompression with laminectomy, T1-4 posterior spinal instrumentation & fusion 09/12/24. PMH: GERD, depression, anxiety, HLD, morbid obesity, Autism spectrum disorder, CKD, DM2. Patient developed Symptomatic bilateral pulmonary emboli s/p mechanical thrombectomy.   OT comments  Nurse Myra reported to this OT pt has been having increased difficulties self feeding, sister endorses and reports B hands are swollen. Pt appears to have good grip strength, potentially limited by edema in hands. Provided pt with built up utensils, educated pt sister on use. Pt is sleeping pre session, lethargic but does follow commands when asked. Nurse reported pt was able to feed himself a bit better today. OT will continue to follow.      If plan is discharge home, recommend the following:  Two people to help with walking and/or transfers;A lot of help with bathing/dressing/bathroom;Two people to help with bathing/dressing/bathroom;Assistance with cooking/housework;Direct supervision/assist for medications management;Direct supervision/assist for financial management;Assist for transportation;Help with stairs or ramp for entrance;Supervision due to cognitive status   Equipment Recommendations  Other (comment) (defer to next venue of care)    Recommendations for Other Services      Precautions / Restrictions Precautions Precautions: Fall;Back;Cervical Precaution Booklet Issued: No Recall of Precautions/Restrictions: Impaired Restrictions Weight Bearing Restrictions Per Provider Order: No       Mobility Bed Mobility                General bed mobility comments: NT on this date    Transfers                         Balance                                           ADL either performed or assessed with clinical judgement   ADL Overall ADL's : Needs assistance/impaired                                       General ADL Comments: Pt squeezes B hands with good grip strength, provided patient with build up red untensils for increased ease of feeding; nurse reports pt fed himself more    Extremity/Trunk Assessment              Vision       Perception     Praxis     Communication Communication Communication: Impaired Factors Affecting Communication: Difficulty expressing self   Cognition Arousal: Lethargic (sleeping upon therapist arrival) Behavior During Therapy: [facility] for tasks assessed/performed Cognition: History of cognitive impairments                               Following commands: Impaired Following commands impaired: Follows one step commands with increased time      Cueing   Cueing Techniques: Verbal cues, Tactile cues, Gestural cues, Visual cues  Exercises Other Exercises Other Exercises: edu sister re use of AE for improved feeding  performance    Shoulder Instructions       General Comments      Pertinent Vitals/ Pain       Pain Assessment Pain Assessment: CPOT Facial Expression: Relaxed, neutral Body Movements: Absence of movements Muscle Tension: Relaxed Compliance with ventilator (intubated pts.): N/A Vocalization (extubated pts.): Sighing, moaning CPOT Total: 1 Pain Location: generalized Pain Descriptors / Indicators: Aching Pain Intervention(s): Monitored during session, Limited activity within patient's tolerance, Repositioned  Home Living                                          Prior Functioning/Environment              Frequency  Min 2X/week        Progress Toward  Goals  OT Goals(current goals can now be found in the care plan section)  Progress towards OT goals: Progressing toward goals  Acute Rehab OT Goals Time For Goal Achievement: 10/01/24  Plan      Co-evaluation                 AM-PAC OT 6 Clicks Daily Activity     Outcome Measure   Help from another person eating meals?: A Little Help from another person taking care of personal grooming?: A Little Help from another person toileting, which includes using toliet, bedpan, or urinal?: A Lot Help from another person bathing (including washing, rinsing, drying)?: A Lot Help from another person to put on and taking off regular upper body clothing?: A Lot Help from another person to put on and taking off regular lower body clothing?: Total 6 Click Score: 13    End of Session    OT Visit Diagnosis: Other abnormalities of gait and mobility (R26.89);Muscle weakness (generalized) (M62.81)   Activity Tolerance Patient limited by fatigue   Patient Left with call bell/phone within reach;with family/visitor present;in bed;with bed alarm set   Nurse Communication Mobility status        Time: 1200-1210 OT Time Calculation (min): 10 min  Charges: OT General Charges $OT Visit: 1 Visit OT Treatments $Self Care/Home Management : 8-22 mins Therisa Sheffield, OTD OTR/L  09/20/24, 1:54 PM

## 2024-09-20 NOTE — Progress Notes (Signed)
 Attending Progress Note  History: Edwin [name] is here for progressive failure to thrive.  He is 58 year old man with rapidly progressing cervical and thoracic myeloradiculopathy.  He is been having progressive weakness numbness and tingling his bilateral upper and lower extremities.  Has had progressive difficulty with ambulation.  Has went from being ambulatory independent to requiring a rollator.  He feels that his legs do not listen to him anymore.  He was scheduled for a anterior cervical discectomy and fusion and posterior thoracic decompression and fusion, however he became too difficult to manage at home and was admitted to the hospital this past weekend.    POD8: No complaints.  POD7: No complaints this morning. Reports sensation in his legs. Is denying pain.  POD6: No complaints this morning  POD5:  pt underwent thrombectomy yesterday. States he is ok this morning.  POD4: Pt states he is doing well this morning.  tachycardic on monitor.  POD3: No new events POD2: Dx with pulmonary emboli last night.  Started on anticoagulation due to extensive clot burden.  POD1: Had pain overnight  Physical Exam: Vitals:   09/20/24 0000 09/20/24 0400  BP: 122/68 132/83  Pulse: (!) 101 (!) 106  Resp: 15 15  Temp: 98.8 F (37.1 C) 98.1 F (36.7 C)  SpO2: 93% 94%    Resting comfortably this morning. Awake to participate in neuro exam.  CNI  Strength:   5/5 BUE. At least 3/5 throughout BLE, Knee extension stronger today than prior.   Anterior incision with dermabond in place. Without significant swelling or drainage  Data: CT CHEST ABDOMEN PELVIS WO CONTRAST Result Date: 09/18/2024 EXAM: CT CHEST, ABDOMEN AND PELVIS WITHOUT CONTRAST 09/18/2024 05:28:08 PM TECHNIQUE: CT of the chest, abdomen and pelvis was performed without the administration of intravenous contrast. Multiplanar reformatted images are provided for review. Automated exposure control, iterative reconstruction, and/or  weight based adjustment of the mA/kV was utilized to reduce the radiation dose to as low as reasonably achievable. COMPARISON: CT from 09/14/2024. CLINICAL HISTORY: Dropping hemoglobin, on anticoagulation. FINDINGS: CHEST: MEDIASTINUM AND LYMPH NODES: Unremarkable. LUNGS AND PLEURA: Mild atelectasis or scarring dependently in both lower lobes. Small amount of gas along the right lower neck on image 1 series 3, probably within a venous structure and related to iv access clinically inconsequential in the absence of a right to left vascular shunt. ABDOMEN AND PELVIS: LIVER: Unremarkable. GALLBLADDER AND BILE DUCTS: Unremarkable. SPLEEN: Unremarkable. PANCREAS: Unremarkable. ADRENAL GLANDS: Unremarkable. KIDNEYS, URETERS AND BLADDER: Unremarkable. GI AND BOWEL: Unremarkable. REPRODUCTIVE ORGANS: Unremarkable. PERITONEUM AND RETROPERITONEUM: Unremarkable. VASCULATURE: Mild aortoiliac atheromatous vascular calcifications. ABDOMINAL AND PELVIS LYMPH NODES: Unremarkable. REPRODUCTIVE ORGANS: Unremarkable. BONES AND SOFT TISSUES: Posterolateral rod and pedicle screw fixation at T1-T2-T3-T4 with posterior decompression. Superficial to this along the posterior paraspinal tissues with mild left eccentricity, we demonstrate a new complex 13.9 x 9.2 x 5.4 cm mixed density mass-like lesion favoring hematoma. The volume of this lesion is 359 cm\S\3. Degenerative glenohumeral arthropathy laterally. Bridging anterior interbody spurring at C6-C7, T1-T2, and multiple additional thoracic levels. Bridging spurring of the sacroiliac joints. Spurring of both hips. Lumbar spondylosis and degenerative disc disease causing impingement at L3-L4, L4-L5, and L5-S1. IMPRESSION: 1. New complex 13.9 x 9.2 x 5.4 cm (volume 359 cc) mixed density mass-like lesion in the posterior paraspinal tissues, favoring hematoma. 2. Lumbar spondylosis and degenerative disc disease with impingement at L3-4, L4-5, and L5-S1. 3. Degenerative glenohumeral arthropathy  laterally. 4. Bridging anterior interbody spurring at C6-C7-T1-T2 and multiple additional thoracic  levels. 5. Bridging spurring of the sacroiliac joints. Electronically signed by: Ryan Salvage MD 09/18/2024 06:06 PM EDT RP Workstation: [id number]    Recent Labs  Lab 09/17/24 0446 09/18/24 0516 09/19/24 0325  NA 140 137 139  K 3.9 3.7 3.6  CL 102 102 105  CO2 24 25 25   BUN 70* 57* 52*  CREATININE 2.30* 2.02* 1.83*  GLUCOSE 163* 130* 128*  CALCIUM 8.1* 7.7* 7.9*   Recent Labs  Lab 09/13/24 2114  AST 29  ALT 19  ALKPHOS 81     Recent Labs  Lab 09/17/24 0446 09/18/24 0516 09/18/24 1533 09/19/24 0325  WBC 10.1 10.2  --  10.4  HGB 9.1* 8.2*   < > 7.7*  HCT 27.3* 24.8*  --  23.6*  PLT 211 159  --  130*   < > = values in this interval not displayed.   No results for input(s): APTT, INR in the last 168 hours.       Assessment/Plan:  Edwin [name] is a 58 year old gentleman with some developmental delay who was experiencing a progressive loss of ambulatory function and coordination in his upper extremities.  He was found to have severe stenosis in the cervical spine as well as thoracic spine consistent with cervical and thoracic myeloradiculopathy.    - HV removed 10/14, CT 10/15 with hematoma in wound cavity, without any neurologic side efffects, patient with improving neurological examination. More brisk LE fx on exam today.  - Ok from neurosurgical standpoint for transition to floor or to DC to rehab  Penne LELON Sharps MD Department of Neurosurgery

## 2024-09-20 NOTE — Plan of Care (Signed)
  Problem: Clinical Measurements: Goal: Diagnostic test results will improve Outcome: Progressing Goal: Respiratory complications will improve Outcome: Progressing   Problem: Elimination: Goal: Will not experience complications related to urinary retention Outcome: Progressing   Problem: Pain Managment: Goal: General experience of comfort will improve and/or be controlled Outcome: Progressing

## 2024-09-20 NOTE — Progress Notes (Signed)
 Awake and alert at 0800.Talkative during bath and bed change.[id number] Complained of intense neck pain. Given prn Oxycodone for pain. Patient slept except for meals until 1600. Fed self small amount of lunch with adaptive foam provided by Therisa from OT.  Held cup to drink as well. MAE better today  More movement in legs noted also.Sister Deena with patient all day.

## 2024-09-21 DIAGNOSIS — R296 Repeated falls: Secondary | ICD-10-CM | POA: Diagnosis not present

## 2024-09-21 LAB — BASIC METABOLIC PANEL WITH GFR
Anion gap: 12 (ref 5–15)
BUN: 32 mg/dL — ABNORMAL HIGH (ref 6–20)
CO2: 25 mmol/L (ref 22–32)
Calcium: 8.5 mg/dL — ABNORMAL LOW (ref 8.9–10.3)
Chloride: 106 mmol/L (ref 98–111)
Creatinine, Ser: 1.48 mg/dL — ABNORMAL HIGH (ref 0.61–1.24)
GFR, Estimated: 54 mL/min — ABNORMAL LOW (ref >60)
Glucose, Bld: 118 mg/dL — ABNORMAL HIGH (ref 70–99)
Potassium: 4.1 mmol/L (ref 3.5–5.1)
Sodium: 143 mmol/L (ref 135–145)

## 2024-09-21 LAB — IRON AND TIBC
Iron: 28 ug/dL — ABNORMAL LOW (ref 45–182)
Saturation Ratios: 16 % — ABNORMAL LOW (ref 17.9–39.5)
TIBC: 179 ug/dL — ABNORMAL LOW (ref 250–450)
UIBC: 151 ug/dL

## 2024-09-21 LAB — CBC
HCT: 23.7 % — ABNORMAL LOW (ref 39.0–52.0)
Hemoglobin: 7.6 g/dL — ABNORMAL LOW (ref 13.0–17.0)
MCH: 27.7 pg (ref 26.0–34.0)
MCHC: 32.1 g/dL (ref 30.0–36.0)
MCV: 86.5 fL (ref 80.0–100.0)
Platelets: 191 K/uL (ref 150–400)
RBC: 2.74 MIL/uL — ABNORMAL LOW (ref 4.22–5.81)
RDW: 13.5 % (ref 11.5–15.5)
WBC: 9.5 K/uL (ref 4.0–10.5)
nRBC: 0.3 % — ABNORMAL HIGH (ref 0.0–0.2)

## 2024-09-21 MED ORDER — OXYCODONE HCL 5 MG PO TABS
2.5000 mg | ORAL_TABLET | ORAL | Status: DC | PRN
Start: 2024-09-21 — End: 2024-09-25

## 2024-09-21 NOTE — Progress Notes (Signed)
 {  Select_TRH_Note:[id number]}

## 2024-09-21 NOTE — Plan of Care (Signed)
  Problem: Clinical Measurements: Goal: Ability to maintain clinical measurements within normal limits will improve Outcome: Progressing Goal: Diagnostic test results will improve Outcome: Progressing   Problem: Skin Integrity: Goal: Risk for impaired skin integrity will decrease Outcome: Progressing   

## 2024-09-21 NOTE — TOC Progression Note (Signed)
 Transition of Care [facility]) - Progression Note    Patient Details  Name: Edwin [name] MRN: [id number] Date of Birth: [birth year]  [facility] of [facility] CM/SW Contact  Corrie JINNY Ruts, LCSW Phone Number: 09/21/2024, 12:12 PM  Clinical Narrative:    Chart reviewed. [facility] from [facility] accepted the patient. I have informed the patient sister.    Expected Discharge Plan: Skilled Nursing Facility Barriers to Discharge: Continued Medical Work up               Expected Discharge Plan and Services   Discharge Planning Services: CM Consult Post Acute Care Choice: Skilled Nursing Facility Living arrangements for the past 2 months: Group Home                                       Social Drivers of Health (SDOH) Interventions SDOH Screenings   Food Insecurity: No Food Insecurity (09/09/2024)  Housing: Low Risk  (09/09/2024)  Transportation Needs: No Transportation Needs (09/09/2024)  Utilities: Not At Risk (09/09/2024)  Alcohol Screen: Low Risk  (12/01/2021)  Depression (PHQ2-9): Low Risk  (05/20/2024)  Financial Resource Strain: Low Risk  (05/08/2024)   Received from [facility]  Physical Activity: Insufficiently Active (12/01/2021)  Social Connections: Moderately Integrated (12/01/2021)  Stress: No Stress Concern Present (12/01/2021)  Tobacco Use: Low Risk  (09/12/2024)    Readmission Risk Interventions     No data to display

## 2024-09-21 NOTE — Progress Notes (Signed)
 Neurosurgery visit note Patient seen and examined.  Overall he is awake answers questions appropriately to his baseline.  He seems to have grossly full strength in his upper extremities.  His incision is clean dry and intact.  He has been mobilizing appropriately and continues to increase his mobility with appropriate pain control.  AP: Overall very reassured by his exam and overall status.  It would be okay from my perspective to back off some of his narcotic pain medication to encourage mobilization with the nursing teams neurosurgery will continue to follow  Edwin [name]. Deatrice, MD Neurosurgery

## 2024-09-21 NOTE — Progress Notes (Signed)
 Good day. After given Ducolax supp. at 1000,patient pooped x 4.  1815 Attempted to call report to 1 C. 1825 Report called to [facility] RN on 1 C.1840 Patient moved via bed to 1C room 130.

## 2024-09-22 ENCOUNTER — Inpatient Hospital Stay

## 2024-09-22 DIAGNOSIS — R296 Repeated falls: Secondary | ICD-10-CM | POA: Diagnosis not present

## 2024-09-22 LAB — BASIC METABOLIC PANEL WITH GFR
Anion gap: 12 (ref 5–15)
BUN: 26 mg/dL — ABNORMAL HIGH (ref 6–20)
CO2: 24 mmol/L (ref 22–32)
Calcium: 8.5 mg/dL — ABNORMAL LOW (ref 8.9–10.3)
Chloride: 103 mmol/L (ref 98–111)
Creatinine, Ser: 1.43 mg/dL — ABNORMAL HIGH (ref 0.61–1.24)
GFR, Estimated: 56 mL/min — ABNORMAL LOW (ref >60)
Glucose, Bld: 153 mg/dL — ABNORMAL HIGH (ref 70–99)
Potassium: 4.7 mmol/L (ref 3.5–5.1)
Sodium: 139 mmol/L (ref 135–145)

## 2024-09-22 LAB — CBC
HCT: 22.6 % — ABNORMAL LOW (ref 39.0–52.0)
Hemoglobin: 7.2 g/dL — ABNORMAL LOW (ref 13.0–17.0)
MCH: 27.7 pg (ref 26.0–34.0)
MCHC: 31.9 g/dL (ref 30.0–36.0)
MCV: 86.9 fL (ref 80.0–100.0)
Platelets: 229 K/uL (ref 150–400)
RBC: 2.6 MIL/uL — ABNORMAL LOW (ref 4.22–5.81)
RDW: 13.8 % (ref 11.5–15.5)
WBC: 9.3 K/uL (ref 4.0–10.5)
nRBC: 0.3 % — ABNORMAL HIGH (ref 0.0–0.2)

## 2024-09-22 MED ORDER — IRON SUCROSE 200 MG IVPB - SIMPLE MED
200.0000 mg | Freq: Once | Status: AC
Start: 2024-09-22 — End: 2024-09-22
  Administered 2024-09-22: 200 mg via INTRAVENOUS
  Filled 2024-09-22: qty 200

## 2024-09-22 MED ORDER — THIORIDAZINE HCL 25 MG PO TABS
100.0000 mg | ORAL_TABLET | Freq: Two times a day (BID) | ORAL | Status: DC
  Administered 2024-09-22 – 2024-09-24 (×4): 100 mg via ORAL
  Filled 2024-09-22 (×5): qty 4

## 2024-09-22 MED ORDER — RISPERIDONE 0.5 MG PO TABS
3.0000 mg | ORAL_TABLET | Freq: Every day | ORAL | Status: DC
  Administered 2024-09-22 – 2024-09-23 (×2): 3 mg via ORAL
  Filled 2024-09-22 (×2): qty 6

## 2024-09-22 MED ORDER — AMANTADINE HCL 100 MG PO CAPS
100.0000 mg | ORAL_CAPSULE | Freq: Two times a day (BID) | ORAL | Status: DC
  Administered 2024-09-22 – 2024-09-24 (×4): 100 mg via ORAL
  Filled 2024-09-22 (×5): qty 1

## 2024-09-22 MED ORDER — CLONAZEPAM 0.5 MG PO TABS: 0.5000 mg | ORAL_TABLET | Freq: Every evening | ORAL | Status: DC | PRN

## 2024-09-22 NOTE — Progress Notes (Signed)
 On assessment, patient left arm significantly more swollen than right arm. Mother noticed this morning, when asked she stated she doesn't remember if it was previously like that. Dr. Fausto notified via secure chat.

## 2024-09-22 NOTE — Progress Notes (Signed)
 Patient has home cpap unit at bedside. Patient had been using home unit until a few nights ago in icu per mother. He transferred to floor where I saw him last night and checked to see if he was using his cpap.  Patient's mask has large rip making mask not adequate for use.  He was placed on an extra large hospital mask by RT with his machine when in icu. Mother not happy as mask caused pressure area at bridge of nose. I applied a gel cushion with the extra large mask last night as mother stated patient was back to snoring again when sleeping w/o cpap. Followed up tonight to place patient on his unit. Mother declined as she stated she could see where there is still too much pressure at bridge of his nose even with cushion in place. They have decided to just use oxygen instead while waiting for mask replacement from home care company. Daughter has ordered.

## 2024-09-22 NOTE — Progress Notes (Signed)
 Neurosurgery visit note Patient seen and examined.  Only overnight events as he has been rather sleepy per nursing little more difficult to rouse though when he is awake he is fluent at his baseline but quickly falls back asleep and has been in bed frequently.  Denies any pain and he has not been receiving any as needed pain medications that would clearly be contributing they did note also that he has left arm swelling with potential workup for underlying issues with IVs on that side.  On physical exam he is sleeping when I see him but easily arousable he answers questions appropriately to his baseline and then once I stop examining or questioning him he falls back asleep in bed.  His incisions are clean dry and intact there is no swelling fluctuance or erythema  AP: Overall the patient is nonfocal on exam and reassuring neurologic exam.  I do wonder whether or not he has been in bed so long and his hospitalization impacting his alertness just for mobilization and activation during the day.  He is a quite a large gentleman and will need assistance mobilizing which I think will help him.  The primary team is looking at into his left upper extremity swelling and he is currently on Eliquis. I would continue to mobilize as you are doing.  Neurosurgery will continue to follow  Edwin [name]. Deatrice, MD Neurosurgery

## 2024-09-22 NOTE — Progress Notes (Addendum)
 PROGRESS NOTE Edwin [name]  FMW:[id number] DOB: 30-Jan-1966 DOA: 09/08/2024 PCP: Liana Fish, NP  130A/130A-AA  LOS: 14 days   HPI Edwin [name] is a 58 year old male with history of GERD, depression, anxiety, hyperlipidemia, morbid obesity, who presents ED for chief concerns of falling episodes, progressive failure to thrive at group home. He was scheduled for a anterior cervical discectomy and fusion and posterior thoracic decompression and fusion, however became too difficult to manage at home and was admitted to the hospital for intervention. ED course: VSS. Imaging concerning for rapidly progressing cervical and thoracic myeloradiculopathy  Neurosurgery consulted and performed C3-4 anterior cervical discectomy and fusion and T2-T3 right sided transpedicular decompression with laminectomy, T1-T4 posterior spinal instrumentation and fusion on 09/12/24  09/22/24- post-op course has been complicated by acute PE requiring anticoagulation  and a large hematoma with acute blood loss anemia requiring blood transfusions. Wound healing well.  PT/OT eval recommending SNF. We need to continue monitoring anemia closely, may require additional blood transfusion.   Assessment & Plan: Severe cervical and thoracic myeloradiculopathy  S/p C3-4 anterior cervical discectomy and fusion and T2-T3 right sided transpedicular decompression with laminectomy, T1-T4 posterior spinal instrumentation and fusion on 09/12/24. He denies pain today.  CT chest, abdomen, pelvis on 10/15- complex 13.9 x 9.2 x 5.4 cm (volume 359 cc) mixed density mass-like lesion in the posterior paraspinal tissues, favoring hematoma. -- neurosurgery following, appreciate your recs. Stable neuro exam so no monitoring imaging needed at this time.  - wound vac removed 10/14 --pain management - PT/OT- recommending SNF  Acute PE and DVT--dx 1 day after spine surgery.  Significant clot burden.  Echo could not visualize right heart.  After  discussing benefits and risk of anticoagulation with Dr. Clois and Dr. Tamea, decision was to continue full anticoagulation due to significant risk of worsening into saddle embolus if anticoagulation is held.   --Echo couldn't visualize right heart --s/p thrombectomy on 10/13 --has been therapeutic on heparin gtt x5 days without complication and no further procedures planned at this time. Transition to eliquis and complete 7 days of treatment dose followed by maintenance.  - monitor for neuro exam changes indicating growth of hematoma - On Eliquis  Left upper extremity swelling --Check RUE venous doppler U/S --Is on Eliquis  Acute blood loss Anemia due to hematoma Iron deficiency Hgb has been downtrending for several days. No melana, hematochezia, or other bleeding. EBL from thrombectomy on 10/13 was 375 and EBL from spine surgery on 10/9 was 600. Per neurosurg, drain output was also significant for a while, so likely a decent component of blood loss there as well (drain has been removed). Thus suspect this is 2/2 surgical blood loss. Large hematoma on CT scan 10/15 13.9cm at widest diameter in posterior paraspinal tissues.  10/18 - Hgb stable at 7.6 10/19 - Hbg down 7.2, no increase in pain --continue to monitor CBC --Transfuse for Hbg < 7 --IV iron infusion today --May need transfusion again tomorrow - re-image hematoma if rapid decrease in hgb or symptoms of growth --given acute VTE, hope to avoid holding anticoagulation, would be high risk for complications  AKI CKD 3b- Cr returning close to baseline at 1.6 today  Mild hypokalemia- correct and monitor  Obesity, Class III, BMI 40-49.9 (morbid obesity) (HCC) This complicates overall care and prognosis.    Falling episodes Abnormality of gait Suspect secondary to spinal stenosis Patient is not endorsing pain --definitive tx for spinal stenosis --PT advising SNF, TOC following   Schizoaffective  disorder (HCC) Home  risperidone 3 mg nightly, Thioridazine 100 mg p.o. twice daily, escitalopram 10 mg daily  --cont home regimen   Hyperlipidemia --cont statin   Type 2 diabetes mellitus with stage 3a chronic kidney disease, without long-term current use of insulin (HCC) --no need for BG checks   Sleep apnea in adult CPAP nightly ordered   Delusion (HCC) With autism --supportive care  Allergy --cont claritin and eye drops, per pt and mother request  DVT prophylaxis: eliquis Code Status: Full code  Family Communication: none at bedside Level of care: Telemetry Medical Dispo:   The patient is from: group home Anticipated d/c is to: SNF rehab Anticipated d/c date is: TBD, monitoring anemia   Subjective and Interval History:  Pt was sleeping comfortably when seen on rounds today.  Wakes to voice.  Reports feeling okay. Denies acute complaints   Objective: Vitals:   09/22/24 0026 09/22/24 0541 09/22/24 0735 09/22/24 1135  BP: 139/88 126/83 (!) 129/91 133/85  Pulse: 97 (!) 106 (!) 101 97  Resp: 16  16 18   Temp: 98.5 F (36.9 C) 99.2 F (37.3 C) 98.6 F (37 C) 98.5 F (36.9 C)  TempSrc:   Oral Oral  SpO2: 97% 96% 98% 97%  Weight:      Height:        Intake/Output Summary (Last 24 hours) at 09/22/2024 1614 Last data filed at 09/22/2024 0900 Gross per 24 hour  Intake 720 ml  Output 300 ml  Net 420 ml   Filed Weights   09/09/24 0336 09/12/24 0835 09/14/24 1350  Weight: (!) 142.2 kg (!) 142.2 kg (!) 142.2 kg    Examination:   General exam: sleeping comfortably, wakes to voice, no acute distress Respiratory system: CTAB, no wheezes, rales or rhonchi, normal respiratory effort. Cardiovascular system: normal S1/S2, RRR Gastrointestinal system: soft, NT, ND Central nervous system: A&O. no gross focal neurologic deficits Skin: dry, intact, normal temperature Extremities: right upper extremity swelling noted  Data Reviewed: I have personally reviewed labs and imaging studies     Latest Ref Rng & Units 09/22/2024    9:29 AM 09/21/2024    6:24 AM 09/20/2024    4:12 AM  CBC  WBC 4.0 - 10.5 K/uL 9.3  9.5  9.5   Hemoglobin 13.0 - 17.0 g/dL 7.2  7.6  7.4   Hematocrit 39.0 - 52.0 % 22.6  23.7  23.2   Platelets 150 - 400 K/uL 229  191  149       Latest Ref Rng & Units 09/22/2024    9:29 AM 09/21/2024    6:24 AM 09/20/2024    4:12 AM  BMP  Glucose 70 - 99 mg/dL 846  881  889   BUN 6 - 20 mg/dL 26  32  39   Creatinine 0.61 - 1.24 mg/dL 8.56  8.51  8.39   Sodium 135 - 145 mmol/L 139  143  140   Potassium 3.5 - 5.1 mmol/L 4.7  4.1  3.4   Chloride 98 - 111 mmol/L 103  106  106   CO2 22 - 32 mmol/L 24  25  24    Calcium 8.9 - 10.3 mg/dL 8.5  8.5  8.1     Burnard DELENA Cunning, DO [facility] If 7PM-7AM, please contact night-coverage 09/22/2024, 4:14 PM

## 2024-09-23 DIAGNOSIS — R296 Repeated falls: Secondary | ICD-10-CM | POA: Diagnosis not present

## 2024-09-23 LAB — CBC
HCT: 24.8 % — ABNORMAL LOW (ref 39.0–52.0)
Hemoglobin: 8 g/dL — ABNORMAL LOW (ref 13.0–17.0)
MCH: 28.3 pg (ref 26.0–34.0)
MCHC: 32.3 g/dL (ref 30.0–36.0)
MCV: 87.6 fL (ref 80.0–100.0)
Platelets: 270 K/uL (ref 150–400)
RBC: 2.83 MIL/uL — ABNORMAL LOW (ref 4.22–5.81)
RDW: 13.8 % (ref 11.5–15.5)
WBC: 8.8 K/uL (ref 4.0–10.5)
nRBC: 0.5 % — ABNORMAL HIGH (ref 0.0–0.2)

## 2024-09-23 LAB — BLOOD GAS, VENOUS
Acid-Base Excess: 4.7 mmol/L — ABNORMAL HIGH (ref 0.0–2.0)
Bicarbonate: 30.9 mmol/L — ABNORMAL HIGH (ref 20.0–28.0)
O2 Saturation: 95.5 %
Patient temperature: 37
pCO2, Ven: 51 mmHg (ref 44–60)
pH, Ven: 7.39 (ref 7.25–7.43)
pO2, Ven: 70 mmHg — ABNORMAL HIGH (ref 32–45)

## 2024-09-23 MED ORDER — SIMETHICONE 40 MG/0.6ML PO SUSP
40.0000 mg | Freq: Four times a day (QID) | ORAL | Status: DC
  Administered 2024-09-23 – 2024-09-24 (×5): 40 mg via ORAL
  Filled 2024-09-23 (×4): qty 30
  Filled 2024-09-23 (×2): qty 0.6
  Filled 2024-09-23: qty 30
  Filled 2024-09-23 (×5): qty 0.6

## 2024-09-23 NOTE — Progress Notes (Signed)
 Physical Therapy Treatment Patient Details Name: Edwin [name] MRN: [id number] DOB: 30-Sep-1966 Today's Date: 09/23/2024   History of Present Illness Pt is a 58 y/o M admitted on 09/08/24 after presenting with c/o falls at his group home. Pt with severe cervical & thoracic myeloradiculopathy. Pt is s/p C3-4 anterior cervical discectomy & fusion, T3 R sided transpedicular decompression with laminectomy, T1-4 posterior spinal instrumentation & fusion 09/12/24. Patient developed Symptomatic bilateral pulmonary emboli s/p mechanical thrombectomy 09/16/24. PMH: GERD, depression, anxiety, HLD, morbid obesity, Autism spectrum disorder, CKD, DM2.    PT Comments  Pt seen for PT tx with pt's sister present, pt agreeable to tx. Pt on 2L/min supplemental O2 via nasal cannula throughout session, SpO2 >/= 90%, max HR 131 bpm. Pt requires mod<>max assist to come to sitting EOB with multimodal cuing re: log rolling technique. Pt is able to transfer sit>stand from elevated EOB & stedy seat with min assist fade to CGA with cuing to push floor away for increased BLE muscle activation. Pt tolerated 3 standing trials, max 30 seconds. Pt is making excellent progress with mobility & would benefit from +2 for safe attempts at stand pivot bed>recliner with RW during next session. Updated d/c recs to post acute rehab >3 hours therapy/day upon d/c as pt is showing steady improvement in mobility compared to last PT session.   If plan is discharge home, recommend the following: Two people to help with walking and/or transfers;Two people to help with bathing/dressing/bathroom;Assistance with cooking/housework;Assistance with feeding;Direct supervision/assist for medications management;Direct supervision/assist for financial management;Help with stairs or ramp for entrance;Assist for transportation;Supervision due to cognitive status   Can travel by private vehicle     No  Equipment Recommendations  None recommended by PT (defer  to next venue)    Recommendations for Other Services Rehab consult     Precautions / Restrictions Precautions Precautions: Fall;Back;Cervical Restrictions Weight Bearing Restrictions Per Provider Order: No     Mobility  Bed Mobility Overal bed mobility: Needs Assistance Bed Mobility: Rolling, Sidelying to Sit Rolling: Max assist (roll L) Sidelying to sit: Mod assist, Used rails, HOB elevated (exit L side of bed)       General bed mobility comments: multimodal cuing re: log rolling technique    Transfers Overall transfer level: Needs assistance Equipment used: Ambulation equipment used Transfers: Sit to/from Stand Sit to Stand: Min assist, Contact guard assist           General transfer comment: Pt able to transfer from elevated EOB with min assist, sit>stand from stedy seat with CGA. Pt pulls to stand with BUE on stedy. PT providing cuing to push the floor away, as pt shifts trunk anteriorly then shifts pelvis anteriorly in 2 seperate movements. Utilized stedy for bed>recliner for safety with +1 assist. Transfer via Lift Equipment: Stedy  Ambulation/Gait                   Stairs             Wheelchair Mobility     Tilt Bed    Modified Rankin (Stroke Patients Only)       Balance Overall balance assessment: Needs assistance Sitting-balance support: Feet supported Sitting balance-Leahy Scale: Fair Sitting balance - Comments: static sitting EOB with CGA   Standing balance support: Bilateral upper extremity supported, Reliant on assistive device for balance, During functional activity Standing balance-Leahy Scale: Poor  Communication Communication Communication: Impaired Factors Affecting Communication: Reduced clarity of speech  Cognition Arousal: Alert Behavior During Therapy: WFL for tasks assessed/performed   PT - Cognitive impairments: History of cognitive impairments                        PT - Cognition Comments: baseline developmental delay. increased time required Following commands: Impaired Following commands impaired: Follows one step commands with increased time    Cueing Cueing Techniques: Verbal cues, Tactile cues, Gestural cues, Visual cues  Exercises Other Exercises Other Exercises: Static standing x 3 in stedy with CGA for balance, first time untimed, other 2 times 30 seconds each.    General Comments        Pertinent Vitals/Pain Pain Assessment Pain Assessment: Faces Faces Pain Scale: Hurts a little bit Pain Location: back Pain Descriptors / Indicators:  (stiffness) Pain Intervention(s): Monitored during session    Home Living                          Prior Function            PT Goals (current goals can now be found in the care plan section) Acute Rehab PT Goals Patient Stated Goal: get better PT Goal Formulation: With patient/family Time For Goal Achievement: 10/01/24 Potential to Achieve Goals: Fair Progress towards PT goals: Progressing toward goals    Frequency    Min 3X/week      PT Plan      Co-evaluation              AM-PAC PT 6 Clicks Mobility   Outcome Measure  Help needed turning from your back to your side while in a flat bed without using bedrails?: A Lot Help needed moving from lying on your back to sitting on the side of a flat bed without using bedrails?: Total Help needed moving to and from a bed to a chair (including a wheelchair)?: Total Help needed standing up from a chair using your arms (e.g., wheelchair or bedside chair)?: Total Help needed to walk in hospital room?: Total Help needed climbing 3-5 steps with a railing? : Total 6 Click Score: 7    End of Session   Activity Tolerance: Patient tolerated treatment well Patient left: in chair;with chair alarm set;with call bell/phone within reach;with family/visitor present Nurse Communication: Mobility status PT Visit Diagnosis:  Unsteadiness on feet (R26.81);Muscle weakness (generalized) (M62.81);Other abnormalities of gait and mobility (R26.89);Difficulty in walking, not elsewhere classified (R26.2)     Time: 8649-8585 PT Time Calculation (min) (ACUTE ONLY): 24 min  Charges:    $Therapeutic Activity: 23-37 mins PT General Charges $$ ACUTE PT VISIT: 1 Visit                     Richerd Pinal, PT, DPT 09/23/24, 2:32 PM   Richerd CHRISTELLA Pinal 09/23/2024, 2:30 PM

## 2024-09-23 NOTE — Plan of Care (Signed)

## 2024-09-23 NOTE — TOC Progression Note (Addendum)
 Transition of Care [facility]) - Progression Note    Patient Details  Name: Edwin [name] MRN: [id number] Date of Birth: [birth year]  [facility] of [facility]) CM/SW Contact  Dalia GORMAN Fuse, RN Phone Number: 09/23/2024, 3:26 PM  Clinical Narrative:    Therapy recs upgraded to CIR. CIR saw the patient, but do not think he is a candidate at this time. Patient has a bed at [facility], his sister [name] accepted the bed and is in agreement with the plan. His payor is Traditional [employer], ins shara is not needed.   Expected Discharge Plan: Skilled Nursing Facility Barriers to Discharge: Continued Medical Work up               Expected Discharge Plan and Services   Discharge Planning Services: CM Consult Post Acute Care Choice: Skilled Nursing Facility Living arrangements for the past 2 months: Group Home                                       Social Drivers of Health (SDOH) Interventions SDOH Screenings   Food Insecurity: No Food Insecurity (09/09/2024)  Housing: Low Risk  (09/09/2024)  Transportation Needs: No Transportation Needs (09/09/2024)  Utilities: Not At Risk (09/09/2024)  Alcohol Screen: Low Risk  (12/01/2021)  Depression (PHQ2-9): Low Risk  (05/20/2024)  Financial Resource Strain: Low Risk  (05/08/2024)   Received from [facility]  Physical Activity: Insufficiently Active (12/01/2021)  Social Connections: Moderately Integrated (12/01/2021)  Stress: No Stress Concern Present (12/01/2021)  Tobacco Use: Low Risk  (09/12/2024)    Readmission Risk Interventions     No data to display

## 2024-09-23 NOTE — Progress Notes (Deleted)
   REFERRING PHYSICIAN:  Liana Fish, [address] [place],  [place] [address]  DOS: ***  HISTORY OF PRESENT ILLNESS: Edwin [name] is *** status post ***. Given *** on discharge from the hospital.   Fusion tested positive for MRSA? Do soap x 3 months*** Preop nasal swab was positive for staph and/or MRSA***- patient is to continue mupirocin and chloraprep x 6 weeks.    PHYSICAL EXAMINATION:  NEUROLOGICAL:  General: In no acute distress.   Awake, alert, oriented to person, place, and time.  Pupils equal round and reactive to light.  Facial tone is symmetric.    Strength: Side Biceps Triceps Deltoid Interossei Grip Wrist Ext. Wrist Flex.  R 5 5 5 5 5 5 5   L 5 5 5 5 5 5 5    Incision c/d/i  Imaging:  Nothing new to review.   Assessment / Plan: Edwin [name] is doing well s/p above surgery. Treatment options reviewed with patient and following plan made:   - We discussed activity escalation and I have advised the patient to lift up to 10 pounds until 6 weeks after surgery (until follow up with Dr. Clois).   - Reviewed wound care.  - Continue current medications including *** - Follow up as scheduled in 4 weeks and prn.   Advised to contact the office if any questions or concerns arise.   Glade Boys PA-C Dept of Neurosurgery

## 2024-09-23 NOTE — Progress Notes (Signed)
 PROGRESS NOTE Edwin [name]  FMW:[id number] DOB: 12-12-1965 DOA: 09/08/2024 PCP: Liana Fish, NP  130A/130A-AA  LOS: 15 days   HPI Edwin [name] is a 58 year old male with history of GERD, depression, anxiety, hyperlipidemia, morbid obesity, who presents ED for chief concerns of falling episodes, progressive failure to thrive at group home. He was scheduled for a anterior cervical discectomy and fusion and posterior thoracic decompression and fusion, however became too difficult to manage at home and was admitted to the hospital for intervention. ED course: VSS. Imaging concerning for rapidly progressing cervical and thoracic myeloradiculopathy  Neurosurgery consulted and performed C3-4 anterior cervical discectomy and fusion and T2-T3 right sided transpedicular decompression with laminectomy, T1-T4 posterior spinal instrumentation and fusion on 09/12/24  09/23/24- post-op course has been complicated by acute PE requiring anticoagulation  and a large hematoma with acute blood loss anemia requiring blood transfusions. Wound healing well.  PT/OT eval recommending SNF.  We need to continue monitoring anemia closely, hemoglobin did improve from 7.2 up to 8.0 today   Assessment & Plan: Severe cervical and thoracic myeloradiculopathy  S/p C3-4 anterior cervical discectomy and fusion and T2-T3 right sided transpedicular decompression with laminectomy, T1-T4 posterior spinal instrumentation and fusion on 09/12/24. He denies pain today.  CT chest, abdomen, pelvis on 10/15- complex 13.9 x 9.2 x 5.4 cm (volume 359 cc) mixed density mass-like lesion in the posterior paraspinal tissues, favoring hematoma. -- neurosurgery following, appreciate your recs. Stable neuro exam so no monitoring imaging needed at this time.  - wound vac removed 10/14 --pain management - PT/OT- recommending SNF  Acute PE and DVT--dx 1 day after spine surgery.  Significant clot burden.  Echo could not visualize right heart.   After discussing benefits and risk of anticoagulation with Dr. Clois and Dr. Tamea, decision was to continue full anticoagulation due to significant risk of worsening into saddle embolus if anticoagulation is held.   --Echo couldn't visualize right heart --s/p thrombectomy on 10/13 -- Treated with heparin gtt x5 days without complication. Transitioned to eliquis.   - monitor for neuro exam changes indicating growth of hematoma - Continue Eliquis  Left upper extremity swelling LUE venous doppler U/S negative for DVT --Is on Eliquis as above  Acute blood loss Anemia due to hematoma Iron deficiency Hgb has been downtrending for several days. No melana, hematochezia, or other bleeding. EBL from thrombectomy on 10/13 was 375 and EBL from spine surgery on 10/9 was 600. Per neurosurg, drain output was also significant for a while, so likely a decent component of blood loss there as well (drain has been removed). Thus suspect this is 2/2 surgical blood loss. Large hematoma on CT scan 10/15 13.9cm at widest diameter in posterior paraspinal tissues.  10/18 - Hgb stable at 7.6 10/19 - Hbg down 7.2, no increase in pain.   Given IV iron 10/20 -hemoglobin improved to 8.0 --continue to monitor CBC --Transfuse for Hbg < 7 --May need transfusion again tomorrow - re-image hematoma if rapid decrease in hgb or symptoms of growth  AKI -resolved CKD 3b- Cr appears back to baseline, 1.43 today  Mild hypokalemia- correct and monitor  Obesity, Class III, BMI 40-49.9 (morbid obesity) (HCC) This complicates overall care and prognosis.    Falling episodes Abnormality of gait Suspect secondary to spinal stenosis Patient is not endorsing pain --definitive tx for spinal stenosis --PT advising SNF, TOC following   Schizoaffective disorder (HCC) Home risperidone 3 mg nightly, Thioridazine 100 mg p.o. twice daily, escitalopram 10 mg daily  --  cont home regimen   Hyperlipidemia --cont statin   Type  2 diabetes mellitus with stage 3a chronic kidney disease, without long-term current use of insulin (HCC) --no need for BG checks   Sleep apnea in adult CPAP nightly ordered Nocturnal oxygen if he does not tolerate CPAP   Delusion (HCC) With autism --supportive care  Allergies --cont claritin and eye drops  DVT prophylaxis: eliquis Code Status: Full code  Family Communication: Sister at bedside on rounds today 10/20 Level of care: Telemetry Medical Dispo:   The patient is from: group home Anticipated d/c is to: SNF rehab Anticipated d/c date is: TBD, monitoring anemia   Subjective and Interval History:  Pt was sleeping comfortably when seen on rounds today, sister at bedside.  Patient wakes easily to voice and denies complaints.  Sister reports that he has complained of his navel hurting intermittently.  She reports extensive workup including with GI as outpatient without any cause.  Because of patient's autism, his descriptions of symptoms can be difficult to interpret.  Sister wondering if this is gas pains and open to trying simethicone .   Objective: Vitals:   09/22/24 2320 09/23/24 0429 09/23/24 0904 09/23/24 1220  BP: 110/72 124/74 118/71 115/68  Pulse: (!) 102 (!) 109 93 97  Resp: 17 16 19 18   Temp: 98.1 F (36.7 C) 98.8 F (37.1 C) 98.3 F (36.8 C) 98.6 F (37 C)  TempSrc: Oral Oral  Oral  SpO2: 99% 100% 100% 100%  Weight:      Height:        Intake/Output Summary (Last 24 hours) at 09/23/2024 1342 Last data filed at 09/23/2024 0800 Gross per 24 hour  Intake 180 ml  Output 1000 ml  Net -820 ml   Filed Weights   09/09/24 0336 09/12/24 0835 09/14/24 1350  Weight: (!) 142.2 kg (!) 142.2 kg (!) 142.2 kg    Examination:   General exam: sleeping comfortably, wakes to voice, no acute distress Respiratory system: CTAB, no wheezes, rales or rhonchi, normal respiratory effort.  Exam slightly limited by snoring Cardiovascular system: normal S1/S2,  RRR Gastrointestinal system: soft, NT, ND Central nervous system: no gross focal neurologic deficits, stable baseline abnormal speech Skin: dry, intact, normal temperature Extremities: Upper extremity edema seems improved today, trace lower extremity edema  Data Reviewed: I have personally reviewed labs and imaging studies    Latest Ref Rng & Units 09/23/2024    4:39 AM 09/22/2024    9:29 AM 09/21/2024    6:24 AM  CBC  WBC 4.0 - 10.5 K/uL 8.8  9.3  9.5   Hemoglobin 13.0 - 17.0 g/dL 8.0  7.2  7.6   Hematocrit 39.0 - 52.0 % 24.8  22.6  23.7   Platelets 150 - 400 K/uL 270  229  191       Latest Ref Rng & Units 09/22/2024    9:29 AM 09/21/2024    6:24 AM 09/20/2024    4:12 AM  BMP  Glucose 70 - 99 mg/dL 846  881  889   BUN 6 - 20 mg/dL 26  32  39   Creatinine 0.61 - 1.24 mg/dL 8.56  8.51  8.39   Sodium 135 - 145 mmol/L 139  143  140   Potassium 3.5 - 5.1 mmol/L 4.7  4.1  3.4   Chloride 98 - 111 mmol/L 103  106  106   CO2 22 - 32 mmol/L 24  25  24    Calcium 8.9 - 10.3  mg/dL 8.5  8.5  8.1     Burnard DELENA Cunning, DO [facility] If 7PM-7AM, please contact night-coverage 09/23/2024, 1:42 PM

## 2024-09-24 DIAGNOSIS — R296 Repeated falls: Secondary | ICD-10-CM | POA: Diagnosis not present

## 2024-09-24 LAB — CBC
HCT: 25 % — ABNORMAL LOW (ref 39.0–52.0)
Hemoglobin: 7.9 g/dL — ABNORMAL LOW (ref 13.0–17.0)
MCH: 27.9 pg (ref 26.0–34.0)
MCHC: 31.6 g/dL (ref 30.0–36.0)
MCV: 88.3 fL (ref 80.0–100.0)
Platelets: 315 K/uL (ref 150–400)
RBC: 2.83 MIL/uL — ABNORMAL LOW (ref 4.22–5.81)
RDW: 14 % (ref 11.5–15.5)
WBC: 10.7 K/uL — ABNORMAL HIGH (ref 4.0–10.5)
nRBC: 0.2 % (ref 0.0–0.2)

## 2024-09-24 LAB — BASIC METABOLIC PANEL WITH GFR
Anion gap: 8 (ref 5–15)
BUN: 28 mg/dL — ABNORMAL HIGH (ref 6–20)
CO2: 27 mmol/L (ref 22–32)
Calcium: 8.4 mg/dL — ABNORMAL LOW (ref 8.9–10.3)
Chloride: 105 mmol/L (ref 98–111)
Creatinine, Ser: 1.54 mg/dL — ABNORMAL HIGH (ref 0.61–1.24)
GFR, Estimated: 52 mL/min — ABNORMAL LOW (ref >60)
Glucose, Bld: 120 mg/dL — ABNORMAL HIGH (ref 70–99)
Potassium: 4.1 mmol/L (ref 3.5–5.1)
Sodium: 140 mmol/L (ref 135–145)

## 2024-09-24 LAB — MAGNESIUM: Magnesium: 1.9 mg/dL (ref 1.7–2.4)

## 2024-09-24 MED ORDER — APIXABAN 5 MG PO TABS: 5.0000 mg | ORAL_TABLET | Freq: Two times a day (BID) | ORAL | Status: DC

## 2024-09-24 MED ORDER — OXYCODONE HCL 5 MG PO TABS: 5.0000 mg | ORAL_TABLET | Freq: Four times a day (QID) | ORAL | 0 refills | Status: DC | PRN

## 2024-09-24 NOTE — Discharge Summary (Signed)
 Edwin [name] FMW:[id number] DOB: [birth year] DOA: 09/08/2024  PCP: Edwin Fish, NP  Admit date: 09/08/2024 Discharge date: 09/24/2024  Time spent: 35 minutes  Recommendations for Outpatient Follow-up:  Neurosurgery f/u next month as scheduled Pcp f/u as well upon discharge from rehab     Discharge Diagnoses:  Principal Problem:   Falling episodes Active Problems:   Delusion (HCC)   Sleep apnea in adult   Moderate mixed hyperlipidemia not requiring statin therapy   Stage 3b chronic kidney disease (HCC)   Type 2 diabetes mellitus with stage 3a chronic kidney disease, without long-term current use of insulin (HCC)   Hyperlipidemia   Schizoaffective disorder (HCC)   Gastroesophageal reflux disease without esophagitis   Spondylosis, cervical, with myelopathy   Cervical disc herniation   Thoracic spondylosis with myelopathy   Weakness of both lower extremities   Lower extremity numbness   Abnormality of gait   Obesity, Class III, BMI 40-49.9 (morbid obesity) (HCC)   Cervical stenosis of spine   Declining mobility   Acute pulmonary embolism without acute cor pulmonale (HCC)   Myeloradiculopathy   Discharge Condition: stable  Diet recommendation: heart healthy  Filed Weights   09/09/24 0336 09/12/24 0835 09/14/24 1350  Weight: (!) 142.2 kg (!) 142.2 kg (!) 142.2 kg    History of present illness:  From admission h and p  All [name] is a 58 year old male with history of GERD, depression, anxiety, hyperlipidemia, morbid obesity, who presents ED for chief concerns of falling episodes at group home.   At bedside, patient able to tell me his first, middle, last name.  He states the year is 2024.  He was able to tell me his age accurately.  And identify his sister at bedside accurately.   He was not able to tell me if he is hurting anywhere.  He asked me if he can sometimes eat candy.   Sister reports that he was complaining of right sided groin pain and on  palpation, I was not able to elicit any pain reaction.   Hospital Course:   Severe cervical and thoracic myeloradiculopathy Postoperative hematoma  S/p C3-4 anterior cervical discectomy and fusion and T2-T3 right sided transpedicular decompression with laminectomy, T1-T4 posterior spinal instrumentation and fusion on 09/12/24.   CT chest, abdomen, pelvis on 10/15- complex 13.9 x 9.2 x 5.4 cm (volume 359 cc) mixed density mass-like lesion in the posterior paraspinal tissues, favoring hematoma. -- neurosurgery following, appreciate your recs. Stable neuro exam so no monitoring imaging needed at this time.  - wound vac removed 10/14 - staples to be removed by neurosurgery today prior to d/c --snf - neurosurgy f/u one month   Acute PE and DVT--dx 1 day after spine surgery.  Significant clot burden.  Echo could not visualize right heart.  After discussing benefits and risk of anticoagulation with Dr. Clois and Dr. Tamea, decision was to continue full anticoagulation due to significant risk of worsening into saddle embolus if anticoagulation is held.   --Echo couldn't visualize right heart --s/p thrombectomy on 10/13 -- Treated with heparin gtt x5 days without complication. Transitioned to eliquis.   - hgb stable on apixaban   Left upper extremity swelling LUE venous doppler U/S negative for DVT   Acute blood loss Anemia due to hematoma and surgical blood loss Iron deficiency Hgb has been downtrending for several days. No melana, hematochezia, or other bleeding. EBL from thrombectomy on 10/13 was 375 and EBL from spine surgery on 10/9 was 600.  Per neurosurg, drain output was also significant for a while, so likely a decent component of blood loss there as well (drain has been removed). Thus suspect this is 2/2 surgical blood loss. And large hematoma Large hematoma on CT scan 10/15 13.9cm at widest diameter in posterior paraspinal tissues.  Hgb now stable  AKI -resolved CKD 3b- Cr  appears back to baseline    Schizoaffective disorder (HCC) Home risperidone 3 mg nightly, Thioridazine 100 mg p.o. twice daily, escitalopram 10 mg daily  --cont home regimen   Type 2 diabetes mellitus with stage 3a chronic kidney disease, without long-term current use of insulin (HCC) --no need for BG checks   Procedures: See above   Consultations: neurosurgery  Discharge Exam: Vitals:   09/24/24 0836 09/24/24 1223  BP: 116/73 104/86  Pulse: (!) 103 86  Resp: 18 16  Temp: 98.3 F (36.8 C) 97.7 F (36.5 C)  SpO2: 100% 100%    General: NAD Cardiovascular: RRR Respiratory: CTAB save for basilar rales Skin: dressing c/d/I over midline cervical incision  Discharge Instructions   Discharge Instructions     Diet - low sodium heart healthy   Complete by: As directed    Discharge wound care:   Complete by: As directed    Follow instructions included in this discharge paperwork   Increase activity slowly   Complete by: As directed       Allergies as of 09/24/2024       Reactions   Citrullus Vulgaris Hives        Medication List     STOP taking these medications    HYDROcodone -acetaminophen  5-325 MG tablet Commonly known as: NORCO/VICODIN   meloxicam  15 MG tablet Commonly known as: MOBIC        TAKE these medications    acetaminophen  500 MG tablet Commonly known as: TYLENOL  Take 1,000 mg by mouth 2 (two) times daily as needed for moderate pain (pain score 4-6).   Amantadine  HCl 100 MG tablet Take 1 tablet (100 mg total) by mouth 2 (two) times daily.   apixaban 5 MG Tabs tablet Commonly known as: ELIQUIS Take 1 tablet (5 mg total) by mouth 2 (two) times daily.   clonazePAM 0.5 MG tablet Commonly known as: KLONOPIN Take 0.5 mg by mouth at bedtime as needed for anxiety.   docusate sodium  100 MG capsule Commonly known as: COLACE Take 1 capsule (100 mg total) by mouth 3 (three) times daily.   empagliflozin  10 MG Tabs tablet Commonly known as:  JARDIANCE  Take 1 tablet (10 mg total) by mouth daily.   escitalopram 10 MG tablet Commonly known as: LEXAPRO Take 10 mg by mouth daily.   omeprazole  20 MG capsule Commonly known as: PRILOSEC TAKE (1) CAPSULE BY MOUTH ONCE DAILY.   oxyCODONE 5 MG immediate release tablet Commonly known as: Oxy IR/ROXICODONE Take 1 tablet (5 mg total) by mouth every 6 (six) hours as needed for severe pain (pain score 7-10) or moderate pain (pain score 4-6).   PreviDent 1.1 % Gel dental gel Generic drug: sodium fluoride Place 1 Application onto teeth at bedtime.   risperiDONE 3 MG tablet Commonly known as: RISPERDAL Take 3 mg by mouth at bedtime.   simvastatin  20 MG tablet Commonly known as: ZOCOR  Take 1 tablet (20 mg total) by mouth daily.   tamsulosin  0.4 MG Caps capsule Commonly known as: FLOMAX  Take 1 capsule (0.4 mg total) by mouth daily.   thioridazine 100 MG tablet Commonly known as: MELLARIL TAKE 1 TABLET  BY MOUTH TWICE DAILY               Discharge Care Instructions  (From admission, onward)           Start     Ordered   09/24/24 0000  Discharge wound care:       Comments: Follow instructions included in this discharge paperwork   09/24/24 1332           Allergies  Allergen Reactions   Citrullus Vulgaris Hives    Contact information for follow-up providers     Edwin Fish, NP Follow up.   Specialty: Nurse Practitioner Why: hospital follow up Contact information: [address] [place] [address] [phone]              Contact information for after-discharge care     Destination     [facility] .   Service: Skilled Nursing Contact information: [address] [place]  [phone]                      The results of significant diagnostics from this hospitalization (including imaging, microbiology, ancillary and laboratory) are listed below for reference.     Significant Diagnostic Studies: US  Venous Img Upper Uni Left (DVT) Result Date: 09/22/2024 CLINICAL DATA:  Left upper extremity edema EXAM: LEFT UPPER EXTREMITY VENOUS DOPPLER ULTRASOUND TECHNIQUE: Gray-scale sonography with graded compression, as well as color Doppler and duplex ultrasound were performed to evaluate the upper extremity deep venous system from the level of the subclavian vein and including the jugular, axillary, basilic, radial, ulnar and upper cephalic vein. Spectral Doppler was utilized to evaluate flow at rest and with distal augmentation maneuvers. COMPARISON:  None Available. FINDINGS: Contralateral Subclavian Vein: Respiratory phasicity is normal and symmetric with the symptomatic side. No evidence of thrombus. Normal compressibility. Internal Jugular Vein: No evidence of thrombus. Normal compressibility, respiratory phasicity and response to augmentation. Subclavian Vein: No evidence of thrombus. Normal compressibility, respiratory phasicity and response to augmentation. Axillary Vein: No evidence of thrombus. Normal compressibility, respiratory phasicity and response to augmentation. Cephalic Vein: No evidence of thrombus. Normal compressibility, respiratory phasicity and response to augmentation. Basilic Vein: No evidence of thrombus. Normal compressibility, respiratory phasicity and response to augmentation. Brachial Veins: No evidence of thrombus. Normal compressibility, respiratory phasicity and response to augmentation. Radial Veins: No evidence of thrombus. Normal compressibility, respiratory phasicity and response to augmentation. Ulnar Veins: No evidence of thrombus. Normal compressibility, respiratory phasicity and response to augmentation. Venous Reflux:  None visualized. Other Findings:  None visualized. IMPRESSION: No evidence of DVT within the left upper extremity. Electronically Signed   By: Wilkie Lent M.D.   On: 09/22/2024 21:36   US  Venous Img Upper Uni  Right(DVT) Result Date: 09/22/2024 CLINICAL DATA:  Right upper extremity edema EXAM: RIGHT UPPER EXTREMITY VENOUS DOPPLER ULTRASOUND TECHNIQUE: Gray-scale sonography with graded compression, as well as color Doppler and duplex ultrasound were performed to evaluate the upper extremity deep venous system from the level of the subclavian vein and including the jugular, axillary, basilic, radial, ulnar and upper cephalic vein. Spectral Doppler was utilized to evaluate flow at rest and with distal augmentation maneuvers. COMPARISON:  None Available. FINDINGS: Contralateral Subclavian Vein: Respiratory phasicity is normal and symmetric with the symptomatic side. No evidence of thrombus. Normal compressibility. Internal Jugular Vein: No evidence of thrombus. Normal compressibility, respiratory phasicity and response to augmentation. Subclavian Vein: No evidence of thrombus. Normal compressibility, respiratory  phasicity and response to augmentation. Axillary Vein: No evidence of thrombus. Normal compressibility, respiratory phasicity and response to augmentation. Cephalic Vein: The vessel is only partially compressible. There is a small amount of focal wall thickening or nonocclusive eccentric thrombus of a short segment of the cephalic vein at the antecubital fossa. Basilic Vein: No evidence of thrombus. Normal compressibility, respiratory phasicity and response to augmentation. Brachial Veins: No evidence of thrombus. Normal compressibility, respiratory phasicity and response to augmentation. Radial Veins: No evidence of thrombus. Normal compressibility, respiratory phasicity and response to augmentation. Ulnar Veins: No evidence of thrombus. Normal compressibility, respiratory phasicity and response to augmentation. Venous Reflux:  None visualized. Other Findings:  None visualized. IMPRESSION: No evidence of DVT within the right upper extremity. Minimal nonocclusive thrombus noted within the superficial cephalic vein  at the level of the antecubital fossa. Electronically Signed   By: Wilkie Lent M.D.   On: 09/22/2024 18:25   CT CHEST ABDOMEN PELVIS WO CONTRAST Result Date: 09/18/2024 EXAM: CT CHEST, ABDOMEN AND PELVIS WITHOUT CONTRAST 09/18/2024 05:28:08 PM TECHNIQUE: CT of the chest, abdomen and pelvis was performed without the administration of intravenous contrast. Multiplanar reformatted images are provided for review. Automated exposure control, iterative reconstruction, and/or weight based adjustment of the mA/kV was utilized to reduce the radiation dose to as low as reasonably achievable. COMPARISON: CT from 09/14/2024. CLINICAL HISTORY: Dropping hemoglobin, on anticoagulation. FINDINGS: CHEST: MEDIASTINUM AND LYMPH NODES: Unremarkable. LUNGS AND PLEURA: Mild atelectasis or scarring dependently in both lower lobes. Small amount of gas along the right lower neck on image 1 series 3, probably within a venous structure and related to iv access clinically inconsequential in the absence of a right to left vascular shunt. ABDOMEN AND PELVIS: LIVER: Unremarkable. GALLBLADDER AND BILE DUCTS: Unremarkable. SPLEEN: Unremarkable. PANCREAS: Unremarkable. ADRENAL GLANDS: Unremarkable. KIDNEYS, URETERS AND BLADDER: Unremarkable. GI AND BOWEL: Unremarkable. REPRODUCTIVE ORGANS: Unremarkable. PERITONEUM AND RETROPERITONEUM: Unremarkable. VASCULATURE: Mild aortoiliac atheromatous vascular calcifications. ABDOMINAL AND PELVIS LYMPH NODES: Unremarkable. REPRODUCTIVE ORGANS: Unremarkable. BONES AND SOFT TISSUES: Posterolateral rod and pedicle screw fixation at T1-T2-T3-T4 with posterior decompression. Superficial to this along the posterior paraspinal tissues with mild left eccentricity, we demonstrate a new complex 13.9 x 9.2 x 5.4 cm mixed density mass-like lesion favoring hematoma. The volume of this lesion is 359 cm\S\3. Degenerative glenohumeral arthropathy laterally. Bridging anterior interbody spurring at C6-C7, T1-T2, and  multiple additional thoracic levels. Bridging spurring of the sacroiliac joints. Spurring of both hips. Lumbar spondylosis and degenerative disc disease causing impingement at L3-L4, L4-L5, and L5-S1. IMPRESSION: 1. New complex 13.9 x 9.2 x 5.4 cm (volume 359 cc) mixed density mass-like lesion in the posterior paraspinal tissues, favoring hematoma. 2. Lumbar spondylosis and degenerative disc disease with impingement at L3-4, L4-5, and L5-S1. 3. Degenerative glenohumeral arthropathy laterally. 4. Bridging anterior interbody spurring at C6-C7-T1-T2 and multiple additional thoracic levels. 5. Bridging spurring of the sacroiliac joints. Electronically signed by: Ryan Salvage MD 09/18/2024 06:06 PM EDT RP Workstation: [id number]   PERIPHERAL VASCULAR CATHETERIZATION Result Date: 09/16/2024 See surgical note for result.  US  Venous Img Lower Bilateral (DVT) Result Date: 09/14/2024 CLINICAL DATA:  PE EXAM: BILATERAL LOWER EXTREMITY VENOUS DOPPLER ULTRASOUND TECHNIQUE: Gray-scale sonography with compression, as well as color and duplex ultrasound, were performed to evaluate the deep venous system(s) from the level of the common femoral vein through the popliteal and proximal calf veins. COMPARISON:  CT angiogram performed September 14, 2024 FINDINGS: VENOUS The right femoral and popliteal segments are patent. A right posterior tibial  vein is noncompressible. A second posterior tibial vein is likely patent. The peroneal veins are patent. The left common, superficial, popliteal, and tibial veins are patent. OTHER None. Limitations: none IMPRESSION: 1. Occlusive thrombus with in 1 right posterior tibial vein. These results will be called to the ordering clinician or representative by the Radiologist Assistant, and communication documented in the PACS or [employer]. Electronically Signed   By: Maude Naegeli M.D.   On: 09/14/2024 13:25   ECHOCARDIOGRAM COMPLETE Result Date: 09/14/2024    ECHOCARDIOGRAM REPORT    Patient Name:   TORELL MINDER Date of Exam: 09/14/2024 Medical Rec #:  [id number]      Height:       74.0 in Accession #:    [id number]     Weight:       313.5 lb Date of Birth:  1966/09/10      BSA:          2.631 m Patient Age:    58 years       BP:           118/78 mmHg Patient Gender: M              HR:           120 bpm. Exam Location:  [facility] Procedure: 2D Echo, Intracardiac Opacification Agent, Cardiac Doppler and Color            Doppler (Both Spectral and Color Flow Doppler were utilized during            procedure). Indications:     Pulmonary Embolus I26.09  History:         Patient has no prior history of Echocardiogram examinations.  Sonographer:     Thedora Louder RDCS, FASE Referring Phys:  [id number] DELAYNE LULLA SOLIAN Diagnosing Phys: Darryle Decent MD  Sonographer Comments: Technically challenging study due to limited acoustic windows, no apical window, suboptimal subcostal window and suboptimal parasternal window. Image acquisition challenging due to patient body habitus. IMPRESSIONS  1. Left ventricular ejection fraction, by estimation, is 65 to 70%. The left ventricle has normal function. The left ventricle has no regional wall motion abnormalities. There is moderate concentric left ventricular hypertrophy. Indeterminate diastolic filling due to E-A fusion.  2. Right ventricular systolic function was not well visualized. The right ventricular size is not well visualized. Tricuspid regurgitation signal is inadequate for assessing PA pressure.  3. The mitral valve was not well visualized. No evidence of mitral valve regurgitation.  4. The aortic valve is tricuspid. Aortic valve regurgitation is not visualized. No aortic stenosis is present.  5. The inferior vena cava is normal in size with greater than 50% respiratory variability, suggesting right atrial pressure of 3 mmHg. FINDINGS  Left Ventricle: Left ventricular ejection fraction, by estimation, is 65 to 70%. The left ventricle has normal function.  The left ventricle has no regional wall motion abnormalities. Definity contrast agent was given IV to delineate the left ventricular  endocardial borders. The left ventricular internal cavity size was normal in size. There is moderate concentric left ventricular hypertrophy. Indeterminate diastolic filling due to E-A fusion. Right Ventricle: The right ventricular size is not well visualized. Right vetricular wall thickness was not well visualized. Right ventricular systolic function was not well visualized. Tricuspid regurgitation signal is inadequate for assessing PA pressure. Left Atrium: Left atrial size was not well visualized. Right Atrium: Right atrial size was not well visualized. Pericardium: There is no evidence of pericardial effusion. Mitral Valve:  The mitral valve was not well visualized. No evidence of mitral valve regurgitation. Tricuspid Valve: The tricuspid valve is grossly normal. Tricuspid valve regurgitation is trivial. No evidence of tricuspid stenosis. Aortic Valve: The aortic valve is tricuspid. Aortic valve regurgitation is not visualized. No aortic stenosis is present. Pulmonic Valve: The pulmonic valve was grossly normal. Pulmonic valve regurgitation is not visualized. No evidence of pulmonic stenosis. Aorta: The aortic root is normal in size and structure. Venous: The inferior vena cava is normal in size with greater than 50% respiratory variability, suggesting right atrial pressure of 3 mmHg. IAS/Shunts: The interatrial septum was not well visualized.  LEFT VENTRICLE PLAX 2D LVIDd:         4.00 cm   Diastology LVIDs:         2.50 cm   LV e' medial:   18.50 cm/s LV PW:         1.40 cm   LV E/e' medial: 4.1 LV IVS:        1.50 cm LVOT diam:     2.10 cm LVOT Area:     3.46 cm  LEFT ATRIUM         Index LA diam:    2.60 cm 0.99 cm/m                        PULMONIC VALVE AORTA                 PV Vmax:        0.72 m/s Ao Root diam: 3.70 cm PV Peak grad:   2.1 mmHg                       RVOT  Peak grad: 2 mmHg  MITRAL VALVE MV Area (PHT): 6.96 cm    SHUNTS MV Decel Time: 109 msec    Systemic Diam: 2.10 cm MV E velocity: 76.70 cm/s MV A velocity: 39.30 cm/s MV E/A ratio:  1.95 Darryle Decent MD Electronically signed by Darryle Decent MD Signature Date/Time: 09/14/2024/1:01:22 PM    Final    CT Angio Chest Pulmonary Embolism (PE) W or WO Contrast Result Date: 09/14/2024 CLINICAL DATA:  Pulmonary embolism (PE) suspected, low to intermediate prob, positive D-dimer EXAM: CT ANGIOGRAPHY CHEST WITH CONTRAST TECHNIQUE: Multidetector CT imaging of the chest was performed using the standard protocol during bolus administration of intravenous contrast. Multiplanar CT image reconstructions and MIPs were obtained to evaluate the vascular anatomy. RADIATION DOSE REDUCTION: This exam was performed according to the departmental dose-optimization program which includes automated exposure control, adjustment of the mA and/or kV according to patient size and/or use of iterative reconstruction technique. CONTRAST:  100mL OMNIPAQUE IOHEXOL 350 MG/ML SOLN COMPARISON:  None Available. FINDINGS: Cardiovascular: Heart is normal size. Aorta is normal caliber. Numerous filling defects in the pulmonary arteries bilaterally involving all lobes of both lungs compatible with pulmonary emboli. No evidence of right heart strain. Mediastinum/Nodes: No mediastinal, hilar, or axillary adenopathy. Trachea and esophagus are unremarkable. Thyroid unremarkable. Lungs/Pleura: In bibasilar airspace opacities could reflect atelectasis or early infarcts. No effusions. Upper Abdomen: No acute findings Musculoskeletal: Chest wall soft tissues are unremarkable. No acute bony abnormality. Postoperative changes in the upper thoracic spine. Review of the MIP images confirms the above findings. IMPRESSION: Numerous bilateral pulmonary emboli involving all lobes of both lungs. No evidence of right heart strain. Bibasilar atelectasis or early infarcts.  These results will be called to the ordering clinician  or representative by the Radiologist Assistant, and communication documented in the PACS or [employer]. Electronically Signed   By: Franky Crease M.D.   On: 09/14/2024 00:19   DG Thoracic Spine 2 View Result Date: 09/12/2024 CLINICAL DATA:  Elective surgery. EXAM: THORACIC SPINE 2 VIEWS COMPARISON:  None Available. FINDINGS: Seven fluoroscopic spot views of the thoracic spine submitted from the operating room as well as 3D C-arm images. Pedicle screws T1 through T4. Fluoroscopy time for both cervical and thoracic surgery 1 minute 51 seconds, dose 290.23 mGy. IMPRESSION: Intraoperative fluoroscopy during thoracic spine surgery. Electronically Signed   By: Andrea Gasman M.D.   On: 09/12/2024 16:19   DG Cervical Spine 2-3 Views Result Date: 09/12/2024 CLINICAL DATA:  Elective surgery. EXAM: CERVICAL SPINE - 2-3 VIEW COMPARISON:  09/04/2024 FINDINGS: Two fluoroscopic spot views of the cervical spine submitted from the operating room. Anterior fusion hardware at C3-C4. Fluoroscopy time for both cervical and thoracic surgery 1 minute 51 seconds, dose 290.23 mGy. IMPRESSION: Intraoperative fluoroscopy during cervical spine surgery. Electronically Signed   By: Andrea Gasman M.D.   On: 09/12/2024 16:18   DG C-Arm 1-60 Min-No Report Result Date: 09/12/2024 Fluoroscopy was utilized by the requesting physician.  No radiographic interpretation.   DG C-Arm 1-60 Min-No Report Result Date: 09/12/2024 Fluoroscopy was utilized by the requesting physician.  No radiographic interpretation.   DG C-Arm 1-60 Min-No Report Result Date: 09/12/2024 Fluoroscopy was utilized by the requesting physician.  No radiographic interpretation.   DG C-Arm 1-60 Min-No Report Result Date: 09/12/2024 Fluoroscopy was utilized by the requesting physician.  No radiographic interpretation.   DG C-Arm 1-60 Min-No Report Result Date: 09/12/2024 Fluoroscopy was utilized by the  requesting physician.  No radiographic interpretation.   DG Knee Complete 4 Views Left Result Date: 09/08/2024 CLINICAL DATA:  Fall from bed onto knees. EXAM: LEFT KNEE - COMPLETE 4+ VIEW; RIGHT KNEE - COMPLETE 4+ VIEW COMPARISON:  None Available. FINDINGS: Right knee: No evidence of fracture, dislocation, or joint effusion. Mild tricompartmental degenerative changes are present. There are enthesopathic changes at the patella. The soft tissues are within normal limits. Left knee: There is no evidence of acute fracture or dislocation. Mild tricompartmental degenerative changes are noted. In the is a pathic changes are present at the patella. No joint effusion is seen. Chondrocalcinosis is noted. The soft tissues are within normal limits. IMPRESSION: No acute fracture or dislocation at the knees bilaterally. Electronically Signed   By: Leita Birmingham M.D.   On: 09/08/2024 16:55   DG Knee Complete 4 Views Right Result Date: 09/08/2024 CLINICAL DATA:  Fall from bed onto knees. EXAM: LEFT KNEE - COMPLETE 4+ VIEW; RIGHT KNEE - COMPLETE 4+ VIEW COMPARISON:  None Available. FINDINGS: Right knee: No evidence of fracture, dislocation, or joint effusion. Mild tricompartmental degenerative changes are present. There are enthesopathic changes at the patella. The soft tissues are within normal limits. Left knee: There is no evidence of acute fracture or dislocation. Mild tricompartmental degenerative changes are noted. In the is a pathic changes are present at the patella. No joint effusion is seen. Chondrocalcinosis is noted. The soft tissues are within normal limits. IMPRESSION: No acute fracture or dislocation at the knees bilaterally. Electronically Signed   By: Leita Birmingham M.D.   On: 09/08/2024 16:55   DG Cervical Spine Complete Result Date: 09/05/2024 EXAM: 6 VIEW(S) XRAY OF THE CERVICAL SPINE 09/04/2024 10:10:00 AM COMPARISON: None available. CLINICAL HISTORY: neck pain. Cervical myelopathy FINDINGS: BONES: No  acute fracture. No aggressive appearing osseous lesion. Alignment is normal. No evidence of instability. DISCS AND DEGENERATIVE CHANGES: Multilevel spondylosis, with prominent bridging anterior osteophytes from C4 through T1. Limited excursion of the cervical spine during flexion and extension. SOFT TISSUES: No prevertebral soft tissue swelling. The visualized lungs appear clear. IMPRESSION: 1. Limited cervical spine excursion on flexion-extension without instability. 2. Multilevel spondylosis with prominent bridging anterior osteophytes from C4 through T1. Electronically signed by: Ozell Daring MD 09/05/2024 07:26 PM EDT RP Workstation: [id number]    Microbiology: Recent Results (from the past 240 hours)  MRSA Next Gen by PCR, Nasal     Status: None   Collection Time: 09/14/24  1:49 PM   Specimen: Nasal Mucosa; Nasal Swab  Result Value Ref Range Status   MRSA by PCR Next Gen NOT DETECTED NOT DETECTED Final    Comment: (NOTE) The GeneXpert MRSA Assay (FDA approved for NASAL specimens only), is one component of a comprehensive MRSA colonization surveillance program. It is not intended to diagnose MRSA infection nor to guide or monitor treatment for MRSA infections. Test performance is not FDA approved in patients less than 84 years old. Performed at [facility], [address] [place], [place] [address]      Labs: Basic Metabolic Panel: Recent Labs  Lab 09/19/24 0325 09/20/24 0412 09/21/24 0624 09/22/24 0929 09/24/24 0701  NA 139 140 143 139 140  K 3.6 3.4* 4.1 4.7 4.1  CL 105 106 106 103 105  CO2 25 24 25 24 27   GLUCOSE 128* 110* 118* 153* 120*  BUN 52* 39* 32* 26* 28*  CREATININE 1.83* 1.60* 1.48* 1.43* 1.54*  CALCIUM 7.9* 8.1* 8.5* 8.5* 8.4*  MG  --   --   --   --  1.9   Liver Function Tests: No results for input(s): AST, ALT, ALKPHOS, BILITOT, PROT, ALBUMIN in the last 168 hours. No results for input(s): LIPASE, AMYLASE in the last 168  hours. No results for input(s): AMMONIA in the last 168 hours. CBC: Recent Labs  Lab 09/20/24 0412 09/21/24 0624 09/22/24 0929 09/23/24 0439 09/24/24 0701  WBC 9.5 9.5 9.3 8.8 10.7*  HGB 7.4* 7.6* 7.2* 8.0* 7.9*  HCT 23.2* 23.7* 22.6* 24.8* 25.0*  MCV 86.9 86.5 86.9 87.6 88.3  PLT 149* 191 229 270 315   Cardiac Enzymes: No results for input(s): CKTOTAL, CKMB, CKMBINDEX, TROPONINI in the last 168 hours. BNP: BNP (last 3 results) Recent Labs    09/13/24 2114  BNP 31.3    ProBNP (last 3 results) No results for input(s): PROBNP in the last 8760 hours.  CBG: No results for input(s): GLUCAP in the last 168 hours.     Signed:  Devaughn KATHEE Ban MD.  [facility] 09/24/2024, 1:34 PM

## 2024-09-24 NOTE — Progress Notes (Signed)
 Occupational Therapy Treatment Patient Details Name: Edwin [name] MRN: [id number] DOB: 1966-09-15 Today's Date: 09/24/2024   History of present illness Pt is a 58 y/o M admitted on 09/08/24 after presenting with c/o falls at his group home. Pt with severe cervical & thoracic myeloradiculopathy. Pt is s/p C3-4 anterior cervical discectomy & fusion, T3 R sided transpedicular decompression with laminectomy, T1-4 posterior spinal instrumentation & fusion 09/12/24. Patient developed Symptomatic bilateral pulmonary emboli s/p mechanical thrombectomy 09/16/24. PMH: GERD, depression, anxiety, HLD, morbid obesity, Autism spectrum disorder, CKD, DM2.   OT comments  Patient seen for OT treatment on this date. Upon arrival to room patient resting in bed, agreeable to PT/OT treatment. Focus of session was on improving bed mobility and standing tolerance. See below for mobility comments. While sitting EOB and standing he reports dizziness which minimally subsided, BP checked 114/75.  While sitting EOB, patient able to return demo scap mobility HEP of shoulder cirlces and scapular elevation/depression x 5 reps each, encouraged patient to perform daily bed level to reduce tension and improve scapular gliding, patient agreeable. Also while sitting EOB, patient able to wash face and demonstrated good sitting balance without UE support.  Patient ended treatment in bed with bed alarm on and all needs within reach. Patient making good progress toward goals, will continue to follow POC. Discharge recommendation remains appropriate.        If plan is discharge home, recommend the following:  Two people to help with walking and/or transfers;A lot of help with bathing/dressing/bathroom;Two people to help with bathing/dressing/bathroom;Assistance with cooking/housework;Direct supervision/assist for medications management;Direct supervision/assist for financial management;Assist for transportation;Help with stairs or ramp for  entrance;Supervision due to cognitive status   Equipment Recommendations  Other (comment) (defer to next venue of care)    Recommendations for Other Services      Precautions / Restrictions Precautions Precautions: Fall;Back;Cervical Precaution Booklet Issued: No Recall of Precautions/Restrictions: Impaired Restrictions Weight Bearing Restrictions Per Provider Order: No       Mobility Bed Mobility Overal bed mobility: Needs Assistance Bed Mobility: Rolling, Sidelying to Sit Rolling: Min assist Sidelying to sit: Mod assist Supine to sit: Mod assist Sit to supine: Mod assist Sit to sidelying: Min assist General bed mobility comments: cues provided for log rolling, demonstrated good teach back; HOB raised and patient used bedrails    Transfers Overall transfer level: Needs assistance Equipment used: Rolling walker (2 wheels), 2 person hand held assist Transfers: Sit to/from Stand Sit to Stand: Mod assist, Max assist, +2 safety/equipment           General transfer comment: patient performed 2 trials of sit<>stand from EOB, required mod-max A x 1 (2nd person there for safety) with patient self limiting due to fear with standing. coaxing/encouragement provided throughout and patient receptive to positive feedback     Balance Overall balance assessment: Needs assistance Sitting-balance support: Feet supported, No upper extremity supported Sitting balance-Leahy Scale: Good     Standing balance support: Bilateral upper extremity supported, Reliant on assistive device for balance, During functional activity Standing balance-Leahy Scale: Poor                             ADL either performed or assessed with clinical judgement   ADL Overall ADL's : Needs assistance/impaired     Grooming: Set up;Wash/dry face  Functional mobility during ADLs: Maximal assistance;+2 for physical assistance;Rolling walker (2 wheels)       Extremity/Trunk Assessment Upper Extremity Assessment Upper Extremity Assessment: Overall WFL for tasks assessed            Vision       Perception     Praxis     Communication Communication Communication: Impaired Factors Affecting Communication: Reduced clarity of speech   Cognition Arousal: Alert Behavior During Therapy: WFL for tasks assessed/performed Cognition: History of cognitive impairments             OT - Cognition Comments: delayed processing but answers questions appropriately. Pt with developmental delay at baseline.                 Following commands: Intact Following commands impaired: Follows one step commands with increased time      Cueing   Cueing Techniques: Verbal cues, Tactile cues, Gestural cues, Visual cues  Exercises      Shoulder Instructions       General Comments      Pertinent Vitals/ Pain       Pain Assessment Pain Assessment: Faces Faces Pain Scale: Hurts a little bit Facial Expression: Grimacing Body Movements: Protection Muscle Tension: Tense, rigid  Home Living                                          Prior Functioning/Environment              Frequency  Min 2X/week        Progress Toward Goals  OT Goals(current goals can now be found in the care plan section)  Progress towards OT goals: Progressing toward goals  Acute Rehab OT Goals Patient Stated Goal: to get stronger OT Goal Formulation: With patient/family Time For Goal Achievement: 10/01/24 Potential to Achieve Goals: Good ADL Goals Pt Will Perform Grooming: with supervision;standing Pt Will Perform Upper Body Dressing: with supervision;sitting Pt Will Perform Lower Body Dressing: sit to/from stand;with contact guard assist Pt Will Transfer to Toilet: with contact guard assist;bedside commode Pt Will Perform Toileting - Clothing Manipulation and hygiene: with contact guard assist;sit to/from stand  Plan       Co-evaluation    PT/OT/SLP Co-Evaluation/Treatment: Yes Reason for Co-Treatment: For patient/therapist safety;To address functional/ADL transfers PT goals addressed during session: Mobility/safety with mobility OT goals addressed during session: ADL's and self-care      AM-PAC OT 6 Clicks Daily Activity     Outcome Measure   Help from another person eating meals?: A Little Help from another person taking care of personal grooming?: A Little Help from another person toileting, which includes using toliet, bedpan, or urinal?: A Lot Help from another person bathing (including washing, rinsing, drying)?: A Lot Help from another person to put on and taking off regular upper body clothing?: A Lot Help from another person to put on and taking off regular lower body clothing?: Total 6 Click Score: 13    End of Session Equipment Utilized During Treatment: Rolling walker (2 wheels)  OT Visit Diagnosis: Other abnormalities of gait and mobility (R26.89);Muscle weakness (generalized) (M62.81)   Activity Tolerance Patient limited by fatigue;Other (comment) (limited by anxiety/fear with standing)   Patient Left in bed;with call bell/phone within reach;with bed alarm set   Nurse Communication Mobility status        Time: 8895-8864 OT Time Calculation (min):  31 min  Charges: OT General Charges $OT Visit: 1 Visit OT Treatments $Self Care/Home Management : 8-22 mins $Therapeutic Activity: 8-22 mins  Rogers Clause, OT/L MSOT, 09/24/2024

## 2024-09-24 NOTE — Progress Notes (Signed)
 Gave report to nurse at station 3 at [facility] resource at [phone].

## 2024-09-24 NOTE — Progress Notes (Signed)
 Physical Therapy Treatment Patient Details Name: Edwin [name] MRN: [id number] DOB: 07/15/1966 Today's Date: 09/24/2024   History of Present Illness Pt is a 58 y/o M admitted on 09/08/24 after presenting with c/o falls at his group home. Pt with severe cervical & thoracic myeloradiculopathy. Pt is s/p C3-4 anterior cervical discectomy & fusion, T3 R sided transpedicular decompression with laminectomy, T1-4 posterior spinal instrumentation & fusion 09/12/24. Patient developed Symptomatic bilateral pulmonary emboli s/p mechanical thrombectomy 09/16/24. PMH: GERD, depression, anxiety, HLD, morbid obesity, Autism spectrum disorder, CKD, DM2.    PT Comments  PT/OT co-treat for pt/staff safety. Pt is alert and agreeable to session. Does have baseline cognitive deficits however was able to follow commands and puts forth great effort throughout. He required less assist today to exit L side of bed via log roll technique. Increased time and vcs for correct performance. Session progressed to EOB there ex and eventually to standing EOB with +2 assist for safety. Max assist of one with author blocking R knee and preventing feet from sliding. Pt is progressing but remains far form his baseline abilities. Acute PT will continue to follow and progress per current POC. Dc recs remain appropriate however per chart, pt looks to be Dcing to STR instead.    If plan is discharge home, recommend the following: Two people to help with walking and/or transfers;Two people to help with bathing/dressing/bathroom;Assistance with cooking/housework;Assistance with feeding;Direct supervision/assist for medications management;Direct supervision/assist for financial management;Help with stairs or ramp for entrance;Assist for transportation;Supervision due to cognitive status     Equipment Recommendations  None recommended by PT       Precautions / Restrictions Precautions Precautions: Fall;Back;Cervical Precaution Booklet Issued:  No Recall of Precautions/Restrictions: Impaired Restrictions Weight Bearing Restrictions Per Provider Order: No     Mobility  Bed Mobility Overal bed mobility: Needs Assistance Bed Mobility: Rolling, Sidelying to Sit Rolling: Min assist Sidelying to sit: Mod assist Supine to sit: Mod assist, Used rails, +2 for safety/equipment Sit to supine: Mod assist Sit to sidelying: Min assist   Transfers Overall transfer level: Needs assistance Equipment used: Rolling walker (2 wheels) Transfers: Sit to/from Stand Sit to Stand: Max assist, +2 safety/equipment, From elevated surface  General transfer comment: Pt stood EOB 2 x from elevated ebd height with max assist of 1 but 2nd person for safety. Author blocked R knee from buckling and cued pt for more erect posture    Ambulation/Gait  General Gait Details: currently unable. Pt struggles in static standing. HE elevated to 128bpm and pt presents with increased anxiety   Balance Overall balance assessment: Needs assistance Sitting-balance support: Feet supported, No upper extremity supported Sitting balance-Leahy Scale: Good     Standing balance support: Bilateral upper extremity supported, Reliant on assistive device for balance, During functional activity Standing balance-Leahy Scale: Poor Standing balance comment: reliant on external assistance + BUE support to maintain static standing balance.     Communication Communication Communication: Impaired Factors Affecting Communication: Reduced clarity of speech  Cognition Arousal: Alert Behavior During Therapy: WFL for tasks assessed/performed   PT - Cognitive impairments: History of cognitive impairments    PT - Cognition Comments: baseline developmental delay. Increased time required Following commands: Intact Following commands impaired: Follows one step commands with increased time    Cueing Cueing Techniques: Verbal cues, Tactile cues, Gestural cues, Visual cues   Pertinent  Vitals/Pain Pain Assessment Pain Assessment: No/denies pain Faces Pain Scale: Hurts a little bit Facial Expression: Grimacing Body Movements: Protection Muscle Tension:  Tense, rigid     PT Goals (current goals can now be found in the care plan section) Acute Rehab PT Goals Patient Stated Goal: get better Progress towards PT goals: Progressing toward goals    Frequency    Min 3X/week       Co-evaluation   Reason for Co-Treatment: For patient/therapist safety;To address functional/ADL transfers PT goals addressed during session: Mobility/safety with mobility;Balance;Proper use of DME;Strengthening/ROM OT goals addressed during session: ADL's and self-care      AM-PAC PT 6 Clicks Mobility   Outcome Measure  Help needed turning from your back to your side while in a flat bed without using bedrails?: A Lot Help needed moving from lying on your back to sitting on the side of a flat bed without using bedrails?: A Lot Help needed moving to and from a bed to a chair (including a wheelchair)?: A Lot Help needed standing up from a chair using your arms (e.g., wheelchair or bedside chair)?: Total Help needed to walk in hospital room?: Total Help needed climbing 3-5 steps with a railing? : Total 6 Click Score: 9    End of Session   Activity Tolerance: Patient tolerated treatment well Patient left: in bed;with call bell/phone within reach;with bed alarm set Nurse Communication: Mobility status PT Visit Diagnosis: Unsteadiness on feet (R26.81);Muscle weakness (generalized) (M62.81);Other abnormalities of gait and mobility (R26.89);Difficulty in walking, not elsewhere classified (R26.2)     Time: 8895-8864 PT Time Calculation (min) (ACUTE ONLY): 31 min  Charges:    $Therapeutic Activity: 8-22 mins PT General Charges $$ ACUTE PT VISIT: 1 Visit                    Rankin Essex PTA 09/24/24, 1:55 PM

## 2024-09-24 NOTE — Progress Notes (Signed)
 34 staples removed from pts thoracic spine. Pt tolerated procedure well, 7 steri strips applied to distal portions of wound.

## 2024-09-24 NOTE — Progress Notes (Signed)
 Attempted to call report to [facility], unable to reach a nurse. Was on hold for . Will attempt to call again shortly.

## 2024-09-24 NOTE — TOC Transition Note (Signed)
 Transition of Care [facility]) - Discharge Note   Patient Details  Name: Edwin [name] MRN: [id number] Date of Birth: [birth year]  [facility] CM/SW Contact:  Dalia GORMAN Fuse, RN Phone Number: 09/24/2024, 1:54 PM   Clinical Narrative:     Patient is medically clear to discharge to [facility] for STR. The patient's sister [name] is aware and agreeable with the discharge plan. [facility] will transport the patient.   Final next level of care: Skilled Nursing Facility Barriers to Discharge: Barriers Resolved   Patient Goals and CMS Choice     Choice offered to / list presented to : Sibling      Discharge Placement              Patient chooses bed at: [facility] [place] Patient to be transferred to facility by: [facility] Name of family member notified: Deena Patient and family notified of of transfer: 09/24/24  Discharge Plan and Services Additional resources added to the After Visit Summary for     Discharge Planning Services: CM Consult Post Acute Care Choice: Skilled Nursing Facility                               Social Drivers of Health (SDOH) Interventions SDOH Screenings   Food Insecurity: No Food Insecurity (09/09/2024)  Housing: Low Risk  (09/09/2024)  Transportation Needs: No Transportation Needs (09/09/2024)  Utilities: Not At Risk (09/09/2024)  Alcohol Screen: Low Risk  (12/01/2021)  Depression (PHQ2-9): Low Risk  (05/20/2024)  Financial Resource Strain: Low Risk  (05/08/2024)   Received from [facility]  Physical Activity: Insufficiently Active (12/01/2021)  Social Connections: Moderately Integrated (12/01/2021)  Stress: No Stress Concern Present (12/01/2021)  Tobacco Use: Low Risk  (09/12/2024)     Readmission Risk Interventions     No data to display

## 2024-09-25 ENCOUNTER — Encounter: Admitting: Orthopedic Surgery

## 2024-09-27 ENCOUNTER — Telehealth: Payer: Self-pay | Admitting: Nurse Practitioner

## 2024-09-27 NOTE — Telephone Encounter (Signed)
 FMLA paperwork completed for brother-in-law. Thresa Bergeron. Faxed to [employer]; [phone]. Notified Deena. Scanned-Toni

## 2024-10-17 ENCOUNTER — Other Ambulatory Visit: Payer: Self-pay | Admitting: Nurse Practitioner

## 2024-10-17 DIAGNOSIS — E559 Vitamin D deficiency, unspecified: Secondary | ICD-10-CM

## 2024-10-22 ENCOUNTER — Other Ambulatory Visit: Payer: Self-pay | Admitting: Neurosurgery

## 2024-10-22 DIAGNOSIS — M47814 Spondylosis without myelopathy or radiculopathy, thoracic region: Secondary | ICD-10-CM

## 2024-10-22 DIAGNOSIS — G959 Disease of spinal cord, unspecified: Secondary | ICD-10-CM

## 2024-10-23 ENCOUNTER — Encounter: Payer: Self-pay | Admitting: Neurosurgery

## 2024-10-23 ENCOUNTER — Other Ambulatory Visit

## 2024-10-23 ENCOUNTER — Ambulatory Visit (INDEPENDENT_AMBULATORY_CARE_PROVIDER_SITE_OTHER): Admitting: Neurosurgery

## 2024-10-23 ENCOUNTER — Ambulatory Visit (INDEPENDENT_AMBULATORY_CARE_PROVIDER_SITE_OTHER)

## 2024-10-23 ENCOUNTER — Ambulatory Visit
Admission: RE | Admit: 2024-10-23 | Discharge: 2024-10-23 | Disposition: A | Discharge status: Home / Self Care | Source: Ambulatory Visit | Attending: Neurosurgery | Admitting: Neurosurgery

## 2024-10-23 VITALS — BP 118/78 | Temp 98.3°F

## 2024-10-23 DIAGNOSIS — R053 Chronic cough: Secondary | ICD-10-CM

## 2024-10-23 DIAGNOSIS — G959 Disease of spinal cord, unspecified: Secondary | ICD-10-CM | POA: Diagnosis not present

## 2024-10-23 DIAGNOSIS — Z981 Arthrodesis status: Secondary | ICD-10-CM

## 2024-10-23 DIAGNOSIS — M47814 Spondylosis without myelopathy or radiculopathy, thoracic region: Secondary | ICD-10-CM

## 2024-10-23 DIAGNOSIS — M79605 Pain in left leg: Secondary | ICD-10-CM

## 2024-10-23 DIAGNOSIS — M5104 Intervertebral disc disorders with myelopathy, thoracic region: Secondary | ICD-10-CM

## 2024-10-23 NOTE — Progress Notes (Signed)
   REFERRING PHYSICIAN:  Liana Fish, [address] [place],  [place] [address]  DOS: 09/12/24 C3-4 ACDF, T1-4 Decompression fusion  History of Present Illness Edwin [name] is a 58 year old male who presents with leg swelling and a lump in his throat. He is accompanied by his mother, sister and caretaker.  He experiences leg swelling. The bump is not painful and has shown some improvement with lotion application, though it persists. He uses a walker for mobility and reports improvement in his condition since surgery, with no pain complaints. A small bandage is present on the bottom of his incision due to minor bleeding from stitches or staples during rehab. No fever or sputum production is noted. Overall improving in mobility with a walker.      PHYSICAL EXAMINATION:  NEUROLOGICAL:  General: In no acute distress.   Awake, alert, oriented to person, place, and time.  Pupils equal round and reactive to light.  Facial tone is symmetric.    Strength: Side Biceps Triceps Deltoid Interossei Grip Wrist Ext. Wrist Flex.  R 5 5 5 5 5 5 5   L 5 5 5 5 5 5 5    Incision c/d/i  Imaging:  Nothing new to review.   Assessment and Plan Assessment & Plan Postoperative cervical and thoracic myeloradiculopathy Neurological examination shows improvement with no pain complaints. He is using a walker and reports feeling better than before surgery. - Continue current rehabilitation and mobility support with walker.  Left leg swelling and bump, possible superficial thrombophlebitis Swelling and bump on the left leg, possibly superficial thrombophlebitis. The bump is palpable and has decreased in size with lotion application. Differential includes superficial vein thrombosis versus deep vein thrombosis. No significant concern for deep vein thrombosis, but further evaluation is warranted due to prolonged immobility. - Ordered ultrasound of the left leg to evaluate for thrombosis.  Incision  site superficial skin breakdown Superficial skin breakdown at the posterior incision site, likely due to skin rubbing. No deep tissue involvement or significant bleeding. Bandage in place for protection. - Continue monitoring and care of the incision site with bandage.  Chronic cough under evaluation for postoperative etiology Chronic dry cough since hospitalization, more pronounced at night. No fever or productive cough. Possible postoperative etiology. Differential includes postoperative throat irritation or pneumonia. - Ordered chest x-ray to evaluate for pneumonia or other causes of cough.  Edwin MICAEL Sharps, MD Dept of Neurosurgery

## 2024-11-01 ENCOUNTER — Encounter: Payer: Self-pay | Admitting: Nurse Practitioner

## 2024-11-06 ENCOUNTER — Telehealth: Payer: Self-pay

## 2024-11-06 ENCOUNTER — Other Ambulatory Visit: Payer: Self-pay

## 2024-11-06 ENCOUNTER — Telehealth: Payer: Self-pay | Admitting: Neurosurgery

## 2024-11-06 MED ORDER — APIXABAN 5 MG PO TABS: 5.0000 mg | ORAL_TABLET | Freq: Two times a day (BID) | ORAL | 0 refills | Status: AC

## 2024-11-06 NOTE — Telephone Encounter (Signed)
 Logan  from [facility] [phone] that pt discharged today and they like to know if we can send pres Eliquis  as per alyssa sent and also melecia will call and schedule him hospital follow up

## 2024-11-06 NOTE — Telephone Encounter (Addendum)
 Per Dr Claudene, this medication needs to come from Dr Fransisca office. I have notified Deena and provided her with Dr Fransisca office #.

## 2024-11-06 NOTE — Telephone Encounter (Signed)
 Patient's sister called to let our office know that he is being discharged from [facility] today and wants to be sure that there is no interruption with his Eliquis  with him transitioning back to his group home, [facility]. She states that a new prescription for this needs to be sent to [facility] in [place]. She states that the person that handles this at the pharmacy leaves at 11 today.

## 2024-11-08 ENCOUNTER — Encounter: Payer: Self-pay | Admitting: Nurse Practitioner

## 2024-11-11 ENCOUNTER — Telehealth: Admitting: Nurse Practitioner

## 2024-11-11 ENCOUNTER — Encounter: Payer: Self-pay | Admitting: Nurse Practitioner

## 2024-11-11 VITALS — Resp 16 | Wt 308.0 lb

## 2024-11-11 DIAGNOSIS — R2681 Unsteadiness on feet: Secondary | ICD-10-CM

## 2024-11-11 DIAGNOSIS — Z9889 Other specified postprocedural states: Secondary | ICD-10-CM

## 2024-11-11 DIAGNOSIS — M4716 Other spondylosis with myelopathy, lumbar region: Secondary | ICD-10-CM

## 2024-11-11 DIAGNOSIS — D1030 Benign neoplasm of unspecified part of mouth: Secondary | ICD-10-CM

## 2024-11-11 NOTE — Progress Notes (Unsigned)
 [facility] [address] [place], [place] [address]  Internal MEDICINE  Telephone Visit  Patient Name: Edwin [name]  [id number]  [id number]  Date of Service: 11/11/2024  I connected with the patient at 1150 by telephone and verified the patients identity using two identifiers.   I discussed the limitations, risks, security and privacy concerns of performing an evaluation and management service by telephone and the availability of in person appointments. I also discussed with the patient that there may be a patient responsible charge related to the service.  The patient expressed understanding and agrees to proceed.    Chief Complaint  Patient presents with   Telephone Screen    Review meds   Telephone Assessment    HPI [name] presents for a telehealth virtual visit for medication review. Over the past few months, he was admitted for back pain and he had back surgery done. He also had pulmonary embolism and blood clots in his legs. He was discharged to a rehab facility and was discharged last week from there. He will continue outpatient physical therapy.  Sister asking for clarification on his medication list if the patient has been getting thioridazine  while at the rehab center and if it is ok for him to continue to take it.  Discharged from [facility] last Wednesday.    Current Medication: Outpatient Encounter Medications as of 11/11/2024  Medication Sig   acetaminophen  (TYLENOL ) 500 MG tablet Take 1,000 mg by mouth 2 (two) times daily as needed for moderate pain (pain score 4-6).   Amantadine  HCl 100 MG tablet Take 1 tablet (100 mg total) by mouth 2 (two) times daily.   apixaban  (ELIQUIS ) 5 MG TABS tablet Take 1 tablet (5 mg total) by mouth 2 (two) times daily.   clonazePAM  (KLONOPIN ) 0.5 MG tablet Take 0.5 mg by mouth at bedtime as needed for anxiety.   docusate sodium  (COLACE) 100 MG capsule Take 1 capsule (100 mg total) by mouth 3 (three) times daily.   empagliflozin   (JARDIANCE ) 10 MG TABS tablet Take 1 tablet (10 mg total) by mouth daily.   escitalopram  (LEXAPRO ) 10 MG tablet Take 10 mg by mouth daily.   omeprazole  (PRILOSEC) 20 MG capsule TAKE (1) CAPSULE BY MOUTH ONCE DAILY.   oxyCODONE  (OXY IR/ROXICODONE ) 5 MG immediate release tablet Take 1 tablet (5 mg total) by mouth every 6 (six) hours as needed for severe pain (pain score 7-10) or moderate pain (pain score 4-6).   risperiDONE  (RISPERDAL ) 3 MG tablet Take 3 mg by mouth at bedtime.   simvastatin  (ZOCOR ) 20 MG tablet Take 1 tablet (20 mg total) by mouth daily.   sodium fluoride (PREVIDENT) 1.1 % GEL dental gel Place 1 Application onto teeth at bedtime.   tamsulosin  (FLOMAX ) 0.4 MG CAPS capsule Take 1 capsule (0.4 mg total) by mouth daily.   thioridazine  (MELLARIL ) 100 MG tablet TAKE 1 TABLET BY MOUTH TWICE DAILY   Vitamin D , Ergocalciferol , (DRISDOL ) 1.25 MG (50000 UNIT) CAPS capsule TAKE ONE CAPSULE BY MOUTH ONCE WEEKLY   No facility-administered encounter medications on file as of 11/11/2024.    Surgical History: Past Surgical History:  Procedure Laterality Date   ANTERIOR CERVICAL DECOMP/DISCECTOMY FUSION N/A 09/12/2024   Procedure: C3-4 anterior cervical discectomy and fusion;  Surgeon: Claudene Penne ORN, MD;  Location: [facility] ORS;  Service: Neurosurgery;  Laterality: N/A;   APPLICATION OF INTRAOPERATIVE CT SCAN N/A 09/12/2024   Procedure: APPLICATION OF INTRAOPERATIVE CT SCAN;  Surgeon: Claudene Penne ORN, MD;  Location:  [facility] ORS;  Service: Neurosurgery;  Laterality: N/A;   BIOPSY  11/23/2023   Procedure: BIOPSY;  Surgeon: Therisa Bi, MD;  Location: [facility] ENDOSCOPY;  Service: Gastroenterology;;   COLONOSCOPY N/A 04/17/2024   Procedure: COLONOSCOPY;  Surgeon: Therisa Bi, MD;  Location[facility] ENDOSCOPY;  Service: Gastroenterology;  Laterality: N/A;   COLONOSCOPY WITH PROPOFOL  N/A 11/23/2023   Procedure: COLONOSCOPY WITH PROPOFOL ;  Surgeon: Therisa Bi, MD;  Location: [facility] ENDOSCOPY;  Service:  Gastroenterology;  Laterality: N/A;   ESOPHAGOGASTRODUODENOSCOPY (EGD) WITH PROPOFOL      ESOPHAGOGASTRODUODENOSCOPY (EGD) WITH PROPOFOL  N/A 11/23/2023   Procedure: ESOPHAGOGASTRODUODENOSCOPY (EGD) WITH PROPOFOL ;  Surgeon: Therisa Bi, MD;  Location: [facility] ENDOSCOPY;  Service: Gastroenterology;  Laterality: N/A;   POLYPECTOMY  11/23/2023   Procedure: POLYPECTOMY;  Surgeon: Therisa Bi, MD;  Location: [facility] ENDOSCOPY;  Service: Gastroenterology;;   POLYPECTOMY  04/17/2024   Procedure: POLYPECTOMY, INTESTINE;  Surgeon: Therisa Bi, MD;  Location: [facility] ENDOSCOPY;  Service: Gastroenterology;;   PULMONARY THROMBECTOMY Bilateral 09/16/2024   Procedure: PULMONARY THROMBECTOMY;  Surgeon: Marea Selinda RAMAN, MD;  Location: [facility] INVASIVE CV LAB;  Service: Cardiovascular;  Laterality: Bilateral;    Medical History: Past Medical History:  Diagnosis Date   Diabetes mellitus without complication (HCC)    Hypercholesteremia    Medical history non-contributory     Family History: Family History  Problem Relation Age of Onset   Diabetes Father    Hypertension Father    Hypertension Sister    Diabetes Sister     Social History   Socioeconomic History   Marital status: Single    Spouse name: Not on file   Number of children: 0   Years of education: Not on file   Highest education level: Not on file  Occupational History   Occupation: Disabled  Tobacco Use   Smoking status: Never   Smokeless tobacco: Never  Vaping Use   Vaping status: Never Used  Substance and Sexual Activity   Alcohol  use: No   Drug use: No   Sexual activity: Not Currently  Other Topics Concern   Not on file  Social History Narrative   Not on file   Social Drivers of Health   Financial Resource Strain: Low Risk  (05/08/2024)   Received from [facility]   Overall Financial Resource Strain (CARDIA)    Difficulty of Paying Living Expenses: Not hard at all  Food Insecurity: No Food Insecurity (09/09/2024)    Hunger Vital Sign    Worried About Running Out of Food in the Last Year: Never true    Ran Out of Food in the Last Year: Never true  Transportation Needs: No Transportation Needs (09/09/2024)   PRAPARE - Administrator, Civil Service (Medical): No    Lack of Transportation (Non-Medical): No  Physical Activity: Insufficiently Active (12/01/2021)   Exercise Vital Sign    Days of Exercise per Week: 3 days    Minutes of Exercise per Session: 10 min  Stress: No Stress Concern Present (12/01/2021)   [employer] of Occupational Health - Occupational Stress Questionnaire    Feeling of Stress : Not at all  Social Connections: Moderately Integrated (12/01/2021)   Social Connection and Isolation Panel    Frequency of Communication with Friends and Family: More than three times a week    Frequency of Social Gatherings with Friends and Family: Once a week    Attends Religious Services: 1 to 4 times per year    Active Member of [employer] or Organizations:  Yes    Attends Club or Organization Meetings: More than 4 times per year    Marital Status: Never married  Intimate Partner Violence: Not At Risk (09/09/2024)   Humiliation, Afraid, Rape, and Kick questionnaire    Fear of Current or Ex-Partner: No    Emotionally Abused: No    Physically Abused: No    Sexually Abused: No      Review of Systems  Constitutional:  Negative for chills, fatigue and unexpected weight change.  HENT:  Negative for congestion, postnasal drip, rhinorrhea, sneezing and sore throat.   Eyes:  Negative for redness.  Respiratory:  Negative for cough, chest tightness, shortness of breath and wheezing.   Cardiovascular: Negative.  Negative for chest pain and palpitations.  Gastrointestinal:  Negative for abdominal pain, constipation, diarrhea, nausea and vomiting.  Genitourinary:  Negative for dysuria and frequency.  Musculoskeletal:  Positive for arthralgias, back pain and gait problem (slowly improving with  rehab). Negative for joint swelling and neck pain.  Skin:  Negative for rash.  Neurological:  Negative for tremors and numbness.  Hematological:  Negative for adenopathy. Does not bruise/bleed easily.  Psychiatric/Behavioral:  Positive for behavioral problems (Depression). Negative for self-injury, sleep disturbance and suicidal ideas. The patient is nervous/anxious.     Vital Signs: Resp 16   Wt (!) 308 lb (139.7 kg)   BMI 39.54 kg/m    Observation/Objective: He is at baseline neurological status. His sister and caregiver are present on the video call as well.     Assessment/Plan: 1. 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   2. Unsteady gait when walking Continue with outpatient physical therapy  3. Fibroma of mouth Asymptomatic, no interventions needed at this time.   4. History of back surgery Recent back surgery, doing well, continue with physical therapy and follow up with neurosurgery.   5. May continue medications on his med list as discussed including thioridazine .    General Counseling: traeson dusza understanding of the findings of today's phone visit and agrees with plan of treatment. I have discussed any further diagnostic evaluation that may be needed or ordered today. We also reviewed his medications today. he has been encouraged to call the office with any questions or concerns that should arise related to todays visit.  Return in about 2 months (around 01/12/2025) for F/U, Camree Wigington PCP.   No orders of the defined types were placed in this encounter.   No orders of the defined types were placed in this encounter.   Time spent:30 Minutes Time spent with patient included reviewing progress notes, labs, imaging studies, and discussing plan for follow up.  [place] Controlled Substance Database was reviewed by me for overdose risk  score (ORS) if appropriate.  This patient was seen by Mardy Maxin, FNP-C in collaboration with Dr. Sigrid Bathe as a part of collaborative care agreement.  Yul Diana R. Maxin, MSN, FNP-C Internal medicine

## 2024-11-12 ENCOUNTER — Encounter: Payer: Self-pay | Admitting: Nurse Practitioner

## 2024-11-12 ENCOUNTER — Telehealth: Payer: Self-pay | Admitting: Nurse Practitioner

## 2024-11-12 NOTE — Telephone Encounter (Signed)
 Received Drug Interaction from [facility]. Gave to Alyssa for signature-Toni

## 2024-11-13 ENCOUNTER — Telehealth: Payer: Self-pay | Admitting: Nurse Practitioner

## 2024-11-13 NOTE — Telephone Encounter (Signed)
 Drug Interaction signed & faxed back to [facility]; [phone]. Scanned-Toni

## 2024-11-15 ENCOUNTER — Other Ambulatory Visit: Payer: Self-pay | Admitting: Nurse Practitioner

## 2024-11-15 ENCOUNTER — Telehealth: Payer: Self-pay | Admitting: Nurse Practitioner

## 2024-11-15 DIAGNOSIS — E1169 Type 2 diabetes mellitus with other specified complication: Secondary | ICD-10-CM

## 2024-11-15 DIAGNOSIS — Z79899 Other long term (current) drug therapy: Secondary | ICD-10-CM

## 2024-11-15 NOTE — Telephone Encounter (Signed)
 Received 11/14/24 Resumption of Care from [facility]. Gave to Alyssa-Toni

## 2024-11-20 ENCOUNTER — Telehealth: Payer: Self-pay | Admitting: Nurse Practitioner

## 2024-11-20 ENCOUNTER — Other Ambulatory Visit: Payer: Self-pay

## 2024-11-20 MED ORDER — APIXABAN 5 MG PO TABS: 5.0000 mg | ORAL_TABLET | Freq: Two times a day (BID) | ORAL | 0 refills | Status: DC

## 2024-11-20 NOTE — Telephone Encounter (Signed)
 11/14/24 Resumption of Care signed & faxed back to [facility]; [phone]. Scanned-Toni

## 2024-11-21 ENCOUNTER — Other Ambulatory Visit: Payer: Self-pay | Admitting: Orthopedic Surgery

## 2024-11-21 DIAGNOSIS — G959 Disease of spinal cord, unspecified: Secondary | ICD-10-CM

## 2024-11-25 ENCOUNTER — Telehealth: Payer: Self-pay | Admitting: Nurse Practitioner

## 2024-11-25 NOTE — Telephone Encounter (Signed)
 Received 11/07/24 therapy orders from [facility]. Gave to Alyssa for signatures-Toni

## 2024-11-26 ENCOUNTER — Telehealth: Payer: Self-pay | Admitting: Nurse Practitioner

## 2024-11-26 NOTE — Telephone Encounter (Addendum)
 Received 11/11/24 therapy orders from [facility]. Gave to Alyssa for signatures-Toni     Signed & faxed back to [facility]; [phone]. Scanned=Toni

## 2024-11-26 NOTE — Progress Notes (Signed)
" ° °  REFERRING PHYSICIAN:  Liana Fish, [address] [place],  [place] [address]  DOS: 09/12/24 C3-4 ACDF, T1-T4 decompression/fusion   HISTORY OF PRESENT ILLNESS:  He was doing well at his last visit with improvement in his pain and walking.   He is doing very well! He has no pain. His walking is better, he is using a rollator. He is not taking oxycocodone.   PHYSICAL EXAMINATION:  NEUROLOGICAL:  General: In no acute distress.   Awake, alert, oriented to person, place, and time.  Pupils equal round and reactive to light.  Facial tone is symmetric.    Strength: Side Biceps Triceps Deltoid Interossei Grip Wrist Ext. Wrist Flex.  R 5 5 5 5 5 5 5   L 5 5 5 5 5 5 5    Incisions well healed.   Imaging:  Cervical xrays dated 12/02/24:  No complications noted.  Report for above xrays not yet available.   Assessment / Plan: Edwin [name] is doing well s/p above surgery. Treatment options reviewed with patient and following plan made:   - He can slowly return to activity as tolerated.  - Follow up with Dr. Claudene in 6 months with repeat xrays.   Advised to contact the office if any questions or concerns arise.   Glade Boys PA-C Dept of Neurosurgery  "

## 2024-12-02 ENCOUNTER — Ambulatory Visit

## 2024-12-02 ENCOUNTER — Encounter: Payer: Self-pay | Admitting: Orthopedic Surgery

## 2024-12-02 ENCOUNTER — Ambulatory Visit: Admitting: Orthopedic Surgery

## 2024-12-02 VITALS — BP 126/80 | Temp 98.2°F | Ht 74.0 in | Wt 308.0 lb

## 2024-12-02 DIAGNOSIS — G959 Disease of spinal cord, unspecified: Secondary | ICD-10-CM

## 2024-12-02 DIAGNOSIS — Z981 Arthrodesis status: Secondary | ICD-10-CM

## 2024-12-09 ENCOUNTER — Telehealth: Payer: Self-pay | Admitting: Nurse Practitioner

## 2024-12-09 NOTE — Telephone Encounter (Signed)
 11/07/24 therapy orders signed and faxed back to [facility]; [phone]. Scanned-Toni

## 2024-12-15 ENCOUNTER — Other Ambulatory Visit: Payer: Self-pay | Admitting: Nurse Practitioner

## 2025-01-07 ENCOUNTER — Other Ambulatory Visit: Payer: Self-pay

## 2025-01-07 ENCOUNTER — Telehealth: Payer: Self-pay | Admitting: Nurse Practitioner

## 2025-01-08 ENCOUNTER — Other Ambulatory Visit: Payer: Self-pay | Admitting: Urology

## 2025-01-09 ENCOUNTER — Telehealth: Payer: Self-pay | Admitting: Nurse Practitioner

## 2025-01-09 NOTE — Telephone Encounter (Addendum)
 Received 01/02/2025 therapy orders from [facility]. Gave to Alyssa for [employer] and faxed back to [facility]; [phone]. Scanned-Toni

## 2025-02-04 ENCOUNTER — Ambulatory Visit: Admitting: Urology

## 2025-02-06 ENCOUNTER — Ambulatory Visit: Admitting: Urology

## 2025-05-21 ENCOUNTER — Ambulatory Visit: Admitting: Nurse Practitioner

## 2025-06-04 ENCOUNTER — Ambulatory Visit: Admitting: Neurosurgery
